# Patient Record
Sex: Female | Born: 1965 | Race: Black or African American | Hispanic: No | Marital: Single | Smoking: Former smoker
Health system: Southern US, Community
[De-identification: ages and names within clinical notes are randomized; demographics above are authoritative.]

## PROBLEM LIST (undated history)

## (undated) DIAGNOSIS — Z923 Personal history of irradiation: Secondary | ICD-10-CM

## (undated) DIAGNOSIS — R7303 Prediabetes: Secondary | ICD-10-CM

## (undated) DIAGNOSIS — I10 Essential (primary) hypertension: Secondary | ICD-10-CM

## (undated) DIAGNOSIS — R519 Headache, unspecified: Secondary | ICD-10-CM

## (undated) DIAGNOSIS — E119 Type 2 diabetes mellitus without complications: Secondary | ICD-10-CM

## (undated) DIAGNOSIS — IMO0002 Reserved for concepts with insufficient information to code with codable children: Secondary | ICD-10-CM

## (undated) HISTORY — PX: NO PAST SURGERIES: SHX2092

## (undated) HISTORY — DX: Reserved for concepts with insufficient information to code with codable children: IMO0002

## (undated) HISTORY — DX: Prediabetes: R73.03

---

## 2004-12-13 ENCOUNTER — Emergency Department: Payer: Self-pay | Admitting: Emergency Medicine

## 2005-06-15 ENCOUNTER — Emergency Department: Payer: Self-pay | Admitting: Emergency Medicine

## 2009-04-28 ENCOUNTER — Emergency Department (HOSPITAL_COMMUNITY): Admission: EM | Admit: 2009-04-28 | Discharge: 2009-04-28 | Payer: Self-pay | Admitting: Emergency Medicine

## 2010-06-07 ENCOUNTER — Emergency Department: Payer: Self-pay | Admitting: Emergency Medicine

## 2011-01-28 LAB — BASIC METABOLIC PANEL
BUN: 19 mg/dL (ref 6–23)
CO2: 25 mEq/L (ref 19–32)
Calcium: 9.1 mg/dL (ref 8.4–10.5)
Chloride: 107 mEq/L (ref 96–112)
Creatinine, Ser: 0.86 mg/dL (ref 0.4–1.2)
GFR calc Af Amer: >60 mL/min (ref >60)
GFR calc non Af Amer: >60 mL/min (ref >60)
Glucose, Bld: 82 mg/dL (ref 70–99)
Potassium: 3.5 mEq/L (ref 3.5–5.1)
Sodium: 140 mEq/L (ref 135–145)

## 2011-01-28 LAB — DIFFERENTIAL
Basophils Absolute: 0.1 10*3/uL (ref 0.0–0.1)
Basophils Relative: 1 % (ref 0–1)
Eosinophils Absolute: 0.4 10*3/uL (ref 0.0–0.7)
Eosinophils Relative: 6 % — ABNORMAL HIGH (ref 0–5)
Lymphocytes Relative: 31 % (ref 12–46)
Lymphs Abs: 2 10*3/uL (ref 0.7–4.0)
Monocytes Absolute: 0.5 10*3/uL (ref 0.1–1.0)
Monocytes Relative: 7 % (ref 3–12)
Neutro Abs: 3.6 10*3/uL (ref 1.7–7.7)
Neutrophils Relative %: 55 % (ref 43–77)

## 2011-01-28 LAB — CBC
HCT: 35.6 % — ABNORMAL LOW (ref 36.0–46.0)
Hemoglobin: 11.5 g/dL — ABNORMAL LOW (ref 12.0–15.0)
MCHC: 32.4 g/dL (ref 30.0–36.0)
MCV: 74.5 fL — ABNORMAL LOW (ref 78.0–100.0)
Platelets: 169 10*3/uL (ref 150–400)
RBC: 4.77 MIL/uL (ref 3.87–5.11)
RDW: 14.3 % (ref 11.5–15.5)
WBC: 6.6 10*3/uL (ref 4.0–10.5)

## 2011-05-13 ENCOUNTER — Emergency Department: Payer: Self-pay | Admitting: Unknown Physician Specialty

## 2011-06-12 ENCOUNTER — Emergency Department: Payer: Self-pay | Admitting: *Deleted

## 2012-04-06 ENCOUNTER — Emergency Department: Payer: Self-pay | Admitting: *Deleted

## 2012-04-06 LAB — COMPREHENSIVE METABOLIC PANEL
Bilirubin,Total: 0.4 mg/dL (ref 0.2–1.0)
Calcium, Total: 8.3 mg/dL — ABNORMAL LOW (ref 8.5–10.1)
Chloride: 111 mmol/L — ABNORMAL HIGH (ref 98–107)
Creatinine: 0.79 mg/dL (ref 0.60–1.30)
Osmolality: 286 (ref 275–301)
Potassium: 3.4 mmol/L — ABNORMAL LOW (ref 3.5–5.1)
SGOT(AST): 17 U/L (ref 15–37)

## 2012-04-06 LAB — URINALYSIS, COMPLETE
Bilirubin,UR: NEGATIVE
Glucose,UR: NEGATIVE mg/dL (ref 0–75)
Ketone: NEGATIVE
Protein: 100
RBC,UR: 9884 /HPF (ref 0–5)
Specific Gravity: 1.024 (ref 1.003–1.030)
Squamous Epithelial: 5

## 2012-04-06 LAB — CBC
Platelet: 225 10*3/uL (ref 150–440)
RBC: 4.36 10*6/uL (ref 3.80–5.20)
RDW: 15.7 % — ABNORMAL HIGH (ref 11.5–14.5)

## 2012-04-06 LAB — WET PREP, GENITAL

## 2012-06-13 ENCOUNTER — Emergency Department: Payer: Self-pay | Admitting: Emergency Medicine

## 2012-06-13 LAB — CBC
MCH: 20.7 pg — ABNORMAL LOW (ref 26.0–34.0)
MCHC: 30.7 g/dL — ABNORMAL LOW (ref 32.0–36.0)
MCV: 67 fL — ABNORMAL LOW (ref 80–100)
Platelet: 201 10*3/uL (ref 150–440)
RBC: 4.49 10*6/uL (ref 3.80–5.20)
RDW: 20.1 % — ABNORMAL HIGH (ref 11.5–14.5)

## 2012-06-13 LAB — COMPREHENSIVE METABOLIC PANEL
Albumin: 3.6 g/dL (ref 3.4–5.0)
Anion Gap: 7 (ref 7–16)
BUN: 19 mg/dL — ABNORMAL HIGH (ref 7–18)
Bilirubin,Total: 0.4 mg/dL (ref 0.2–1.0)
Chloride: 104 mmol/L (ref 98–107)
Creatinine: 0.96 mg/dL (ref 0.60–1.30)
EGFR (African American): >60
Potassium: 3.1 mmol/L — ABNORMAL LOW (ref 3.5–5.1)
Sodium: 140 mmol/L (ref 136–145)
Total Protein: 7.6 g/dL (ref 6.4–8.2)

## 2012-06-13 LAB — PROTIME-INR: Prothrombin Time: 13.4 secs (ref 11.5–14.7)

## 2012-06-13 LAB — URINALYSIS, COMPLETE

## 2013-11-20 ENCOUNTER — Inpatient Hospital Stay: Payer: Self-pay | Admitting: Family Medicine

## 2013-11-20 LAB — CBC
HCT: 38.2 % (ref 35.0–47.0)
HGB: 11.5 g/dL — AB (ref 12.0–16.0)
MCH: 19.5 pg — AB (ref 26.0–34.0)
MCHC: 30 g/dL — AB (ref 32.0–36.0)
MCV: 65 fL — AB (ref 80–100)
Platelet: 158 10*3/uL (ref 150–440)
RBC: 5.9 10*6/uL — ABNORMAL HIGH (ref 3.80–5.20)
RDW: 24.1 % — AB (ref 11.5–14.5)
WBC: 13.3 10*3/uL — AB (ref 3.6–11.0)

## 2013-11-20 LAB — COMPREHENSIVE METABOLIC PANEL
Albumin: 3.4 g/dL (ref 3.4–5.0)
Alkaline Phosphatase: 85 U/L
Anion Gap: 8 (ref 7–16)
BUN: 18 mg/dL (ref 7–18)
Bilirubin,Total: 1.1 mg/dL — ABNORMAL HIGH (ref 0.2–1.0)
CALCIUM: 8.9 mg/dL (ref 8.5–10.1)
CHLORIDE: 103 mmol/L (ref 98–107)
CO2: 25 mmol/L (ref 21–32)
Creatinine: 0.86 mg/dL (ref 0.60–1.30)
EGFR (African American): >60
GLUCOSE: 95 mg/dL (ref 65–99)
Osmolality: 274 (ref 275–301)
POTASSIUM: 3.3 mmol/L — AB (ref 3.5–5.1)
SGOT(AST): 18 U/L (ref 15–37)
SGPT (ALT): 15 U/L (ref 12–78)
Sodium: 136 mmol/L (ref 136–145)
Total Protein: 8.2 g/dL (ref 6.4–8.2)

## 2013-11-20 LAB — DRUG SCREEN, URINE
Amphetamines, Ur Screen: NEGATIVE (ref ?–1000)
BENZODIAZEPINE, UR SCRN: NEGATIVE (ref ?–200)
Barbiturates, Ur Screen: NEGATIVE (ref ?–200)
Cannabinoid 50 Ng, Ur ~~LOC~~: POSITIVE (ref ?–50)
Cocaine Metabolite,Ur ~~LOC~~: NEGATIVE (ref ?–300)
MDMA (Ecstasy)Ur Screen: NEGATIVE (ref ?–500)
METHADONE, UR SCREEN: NEGATIVE (ref ?–300)
Opiate, Ur Screen: POSITIVE (ref ?–300)
PHENCYCLIDINE (PCP) UR S: NEGATIVE (ref ?–25)
TRICYCLIC, UR SCREEN: NEGATIVE (ref ?–1000)

## 2013-11-20 LAB — MAGNESIUM: MAGNESIUM: 1.9 mg/dL

## 2013-11-21 LAB — CBC WITH DIFFERENTIAL/PLATELET
Bands: 2 %
HCT: 35.4 % (ref 35.0–47.0)
HGB: 10.9 g/dL — ABNORMAL LOW (ref 12.0–16.0)
LYMPHS PCT: 19 %
MCH: 20.1 pg — ABNORMAL LOW (ref 26.0–34.0)
MCHC: 30.9 g/dL — ABNORMAL LOW (ref 32.0–36.0)
MCV: 65 fL — AB (ref 80–100)
Platelet: 156 10*3/uL (ref 150–440)
RBC: 5.43 10*6/uL — AB (ref 3.80–5.20)
RDW: 24.5 % — ABNORMAL HIGH (ref 11.5–14.5)
SEGMENTED NEUTROPHILS: 79 %
WBC: 10.2 10*3/uL (ref 3.6–11.0)

## 2013-11-21 LAB — COMPREHENSIVE METABOLIC PANEL
ALBUMIN: 3.1 g/dL — AB (ref 3.4–5.0)
ALK PHOS: 80 U/L
AST: 14 U/L — AB (ref 15–37)
Anion Gap: 3 — ABNORMAL LOW (ref 7–16)
BILIRUBIN TOTAL: 0.6 mg/dL (ref 0.2–1.0)
BUN: 18 mg/dL (ref 7–18)
CO2: 28 mmol/L (ref 21–32)
CREATININE: 1 mg/dL (ref 0.60–1.30)
Calcium, Total: 9 mg/dL (ref 8.5–10.1)
Chloride: 106 mmol/L (ref 98–107)
EGFR (Non-African Amer.): >60
Glucose: 138 mg/dL — ABNORMAL HIGH (ref 65–99)
OSMOLALITY: 278 (ref 275–301)
Potassium: 3.4 mmol/L — ABNORMAL LOW (ref 3.5–5.1)
SGPT (ALT): 13 U/L (ref 12–78)
Sodium: 137 mmol/L (ref 136–145)
Total Protein: 7.8 g/dL (ref 6.4–8.2)

## 2013-11-22 LAB — IRON AND TIBC
IRON BIND. CAP.(TOTAL): 347 ug/dL (ref 250–450)
IRON SATURATION: 8 %
Iron: 27 ug/dL — ABNORMAL LOW (ref 50–170)
UNBOUND IRON-BIND. CAP.: 320 ug/dL

## 2013-11-22 LAB — CBC WITH DIFFERENTIAL/PLATELET
BASOS PCT: 0.2 %
Basophil #: 0 10*3/uL (ref 0.0–0.1)
Eosinophil #: 0 10*3/uL (ref 0.0–0.7)
Eosinophil %: 0.1 %
HCT: 33.4 % — AB (ref 35.0–47.0)
HGB: 10.2 g/dL — ABNORMAL LOW (ref 12.0–16.0)
LYMPHS ABS: 1.9 10*3/uL (ref 1.0–3.6)
LYMPHS PCT: 12.1 %
MCH: 19.7 pg — AB (ref 26.0–34.0)
MCHC: 30.5 g/dL — ABNORMAL LOW (ref 32.0–36.0)
MCV: 65 fL — ABNORMAL LOW (ref 80–100)
MONO ABS: 0.6 x10 3/mm (ref 0.2–0.9)
Monocyte %: 4 %
NEUTROS ABS: 12.8 10*3/uL — AB (ref 1.4–6.5)
NEUTROS PCT: 83.6 %
PLATELETS: 188 10*3/uL (ref 150–440)
RBC: 5.18 10*6/uL (ref 3.80–5.20)
RDW: 24.5 % — ABNORMAL HIGH (ref 11.5–14.5)
WBC: 15.3 10*3/uL — AB (ref 3.6–11.0)

## 2013-11-22 LAB — BASIC METABOLIC PANEL
ANION GAP: 5 — AB (ref 7–16)
BUN: 23 mg/dL — AB (ref 7–18)
CHLORIDE: 108 mmol/L — AB (ref 98–107)
Calcium, Total: 8.8 mg/dL (ref 8.5–10.1)
Co2: 26 mmol/L (ref 21–32)
Creatinine: 0.98 mg/dL (ref 0.60–1.30)
EGFR (African American): >60
GLUCOSE: 144 mg/dL — AB (ref 65–99)
OSMOLALITY: 284 (ref 275–301)
Potassium: 3.9 mmol/L (ref 3.5–5.1)
SODIUM: 139 mmol/L (ref 136–145)

## 2013-11-22 LAB — FERRITIN: FERRITIN (ARMC): 30 ng/mL (ref 8–388)

## 2013-11-22 LAB — MAGNESIUM: Magnesium: 2.3 mg/dL

## 2013-11-23 LAB — CBC WITH DIFFERENTIAL/PLATELET
BASOS ABS: 0 10*3/uL (ref 0.0–0.1)
BASOS PCT: 0.1 %
EOS PCT: 0 %
Eosinophil #: 0 10*3/uL (ref 0.0–0.7)
HCT: 33.8 % — ABNORMAL LOW (ref 35.0–47.0)
HGB: 10.2 g/dL — AB (ref 12.0–16.0)
Lymphocyte #: 2.1 10*3/uL (ref 1.0–3.6)
Lymphocyte %: 18 %
MCH: 19.7 pg — AB (ref 26.0–34.0)
MCHC: 30.3 g/dL — ABNORMAL LOW (ref 32.0–36.0)
MCV: 65 fL — ABNORMAL LOW (ref 80–100)
MONO ABS: 0.5 x10 3/mm (ref 0.2–0.9)
Monocyte %: 4.2 %
Neutrophil #: 9 10*3/uL — ABNORMAL HIGH (ref 1.4–6.5)
Neutrophil %: 77.7 %
Platelet: 194 10*3/uL (ref 150–440)
RBC: 5.19 10*6/uL (ref 3.80–5.20)
RDW: 24.3 % — ABNORMAL HIGH (ref 11.5–14.5)
WBC: 11.6 10*3/uL — ABNORMAL HIGH (ref 3.6–11.0)

## 2013-11-23 LAB — BASIC METABOLIC PANEL
Anion Gap: 3 — ABNORMAL LOW (ref 7–16)
BUN: 22 mg/dL — AB (ref 7–18)
CHLORIDE: 109 mmol/L — AB (ref 98–107)
Calcium, Total: 8.5 mg/dL (ref 8.5–10.1)
Co2: 26 mmol/L (ref 21–32)
Creatinine: 0.9 mg/dL (ref 0.60–1.30)
EGFR (African American): >60
Glucose: 129 mg/dL — ABNORMAL HIGH (ref 65–99)
OSMOLALITY: 281 (ref 275–301)
Potassium: 3.7 mmol/L (ref 3.5–5.1)
Sodium: 138 mmol/L (ref 136–145)

## 2013-11-25 LAB — BASIC METABOLIC PANEL
ANION GAP: 17 — AB (ref 7–16)
BUN: 27 mg/dL — AB (ref 7–18)
CALCIUM: 8.4 mg/dL — AB (ref 8.5–10.1)
CHLORIDE: 108 mmol/L — AB (ref 98–107)
CREATININE: 1.02 mg/dL (ref 0.60–1.30)
Co2: 15 mmol/L — ABNORMAL LOW (ref 21–32)
EGFR (African American): >60
EGFR (Non-African Amer.): >60
GLUCOSE: 119 mg/dL — AB (ref 65–99)
Osmolality: 286 (ref 275–301)
Potassium: 3.5 mmol/L (ref 3.5–5.1)
Sodium: 140 mmol/L (ref 136–145)

## 2013-11-25 LAB — CULTURE, BLOOD (SINGLE)

## 2014-01-28 LAB — LIPID PANEL
CHOLESTEROL: 169 mg/dL (ref 0–200)
HDL: 57 mg/dL (ref 35–70)
LDL CALC: 88 mg/dL
Triglycerides: 120 mg/dL (ref 40–160)

## 2014-01-28 LAB — HEMOGLOBIN A1C: Hemoglobin A1C: 5.7

## 2014-01-28 LAB — TSH: TSH: 1.52 u[IU]/mL (ref <=5.90)

## 2014-02-24 ENCOUNTER — Ambulatory Visit: Payer: Self-pay

## 2015-01-19 ENCOUNTER — Emergency Department
Admit: 2015-01-19 | Disposition: A | Discharge status: Home / Self Care | Payer: Self-pay | Admitting: Emergency Medicine

## 2015-01-19 LAB — CBC WITH DIFFERENTIAL/PLATELET
BASOS ABS: 0 10*3/uL (ref 0.0–0.1)
BASOS PCT: 0.7 %
EOS ABS: 0 10*3/uL (ref 0.0–0.7)
Eosinophil %: 0.2 %
HCT: 26.3 % — ABNORMAL LOW (ref 35.0–47.0)
HGB: 7.4 g/dL — AB (ref 12.0–16.0)
LYMPHS PCT: 14 %
Lymphocyte #: 0.9 10*3/uL — ABNORMAL LOW (ref 1.0–3.6)
MCH: 15.5 pg — ABNORMAL LOW (ref 26.0–34.0)
MCHC: 28.1 g/dL — AB (ref 32.0–36.0)
MCV: 55 fL — ABNORMAL LOW (ref 80–100)
Monocyte #: 0.3 x10 3/mm (ref 0.2–0.9)
Monocyte %: 4.3 %
NEUTROS PCT: 80.8 %
Neutrophil #: 5.2 10*3/uL (ref 1.4–6.5)
PLATELETS: 156 10*3/uL (ref 150–440)
RBC: 4.76 10*6/uL (ref 3.80–5.20)
RDW: 23.4 % — AB (ref 11.5–14.5)
WBC: 6.4 10*3/uL (ref 3.6–11.0)

## 2015-01-19 LAB — BASIC METABOLIC PANEL
ANION GAP: 8 (ref 7–16)
BUN: 17 mg/dL
CHLORIDE: 107 mmol/L
CREATININE: 0.94 mg/dL
Calcium, Total: 8.5 mg/dL — ABNORMAL LOW
Co2: 23 mmol/L
EGFR (Non-African Amer.): >60
Glucose: 103 mg/dL — ABNORMAL HIGH
Potassium: 3.1 mmol/L — ABNORMAL LOW
Sodium: 138 mmol/L

## 2015-02-12 NOTE — H&P (Signed)
PATIENT NAME:  Cindy Burke, Cindy Burke A MR#:  [id number] DATE OF BIRTH:  Jun 11, 1966  DATE OF ADMISSION:  11/20/2013  PRIMARY CARE PHYSICIAN: None.   HISTORY OF PRESENT ILLNESS: The patient is a 49 year old African American female with history of hypertension who is followed by [place], who presents to the hospital with complaints of face as well as jaw swelling and pain. Per the patient, she was doing well up until approximately a week ago when she started having swelling in her face as well as right jaw. She was having also pain whenever she chewed or even when she touched her jaw. She was not able to eat solid food, however, was able to drink a little bit. Because of the pain, she decided to come to Emergency Room for further evaluation. She admits of having sweating, however, denies any fevers or chills. She actually does not have any thermometer to check for fevers. She stated that she has been having problems with teeth for a long period of time now, however, has not seen a dentist for a long period of time as well.   She was noted to have significant swelling in the Emergency Room. She underwent CT scan of her neck on 11/20/2013 and was noted to have right lower molar tooth destruction as well as medial mandible destruction and infection extending into the soft tissues around the mandible, but no abscess was noted. The hospitalist services were contacted for admission.   PAST MEDICAL HISTORY: Significant for history of hypertension.   MEDICATIONS: Unknown. The patient tells me that she is taking some blood pressure medication. She does not remember what kind. She has been given this medication by a physician from [place].     The patient stated on arrival to the Emergency Room that she is out of her blood pressure medications for the past 1 week.   PAST SURGICAL HISTORY: None.   ALLERGIES: None.   FAMILY HISTORY: The patient's mother died of emphysema and also had hypertension. The  patient's father had diabetes mellitus and hypertension. He is also dead.   SOCIAL HISTORY: The patient used to smoke 1 pack per day for more than 30 years. Now she has cut down to 2 cigarettes a day. No alcohol.   REVIEW OF SYSTEMS: Positive for feeling sweaty. Admits of pains in the jaw, difficulty swallowing, a prolonged period of tooth problems but denies any fevers, chills, fatigue, weakness, weight loss or gain.  EYES: Denies any blurry vision, double vision, glaucoma or cataracts.  ENT: Denies any tinnitus, allergies, epistaxis, sinus pain.  RESPIRATORY: Denies cough, wheezes, asthma, COPD. CARDIOVASCULAR:  Denies any chest pain.  Denies orthopnea, edema, arrhythmias, palpitations or syncope.  17.  GASTROINTESTINAL: Denies nausea, diarrhea, or constipation.  18.  GENITOURINARY: Denies dysuria, hematuria, frequency, or incontinence.  19.  ENDOCRINE: Denies polydipsia, nocturia, thyroid problems, heat or cold intolerance or thirst.  20.  HEMATOLOGIC: Denies anemia, easy bruising, bleeding or swollen glands.  SKIN: Denies any acne, rash, lesions or change in moles.   MUSCULOSKELETAL: Denies arthritis, cramps, swelling, or gout.  NEUROLOGIC: Denies any numbness, epilepsy, tremors.  PSYCHIATRIC: Denies anxiety, insomnia, depression.   PHYSICAL EXAMINATION:  VITAL SIGNS: On arrival to the hospital, temperature is 98.9, pulse 86, respiratory rate was 20, blood pressure 208/111, saturation 100% on room air.  GENERAL: A well-developed, well-nourished, obese, African American female in mild distress, lying on the stretcher.  HEENT: Her pupils were equal and reactive to light. Extraocular movements intact. No icterus  or conjunctivitis. No pharyngeal edema. Mucosa is moist.  The patient does have multiple decayed teeth in the right lower mandible. Having difficulty reaching those teeth, however, very tender to palpation. The jaw is very swollen and lower facial swelling was noted with some facial  paresis even due to swelling.  NECK: Very swollen. Not able to assess for adenopathy because of significant swelling and tenderness. No JVD. No carotid bruits bilaterally. Full range of motion.  LUNGS: Clear to auscultation in all fields. No rales, rhonchi, diminished breath sounds, or wheezing. No labored inspirations, increased effort, dullness to percussion or respiratory distress.  CARDIOVASCULAR: S1, S2 appreciated. No murmurs, rubs, or gallops. PMI not lateralized. Chest is not tender to palpation. Rhythm was regular. No lower extremity edema, calf tenderness or cyanosis was noted.  ABDOMEN: Soft, nontender. Bowel sounds are present. No hepatosplenomegaly or masses were noted.  RECTAL: Deferred.  MUSCULOSKELETAL: Able to move all extremities. No cyanosis, degenerative joint or kyphosis. Gait not tested.  SKIN: Did not reveal any rashes, lesions, erythema, nodularity or induration. It was warm and dry to palpation.  LYMPHATIC: No adenopathy in the cervical region.  NEUROLOGICAL: Cranial nerves grossly intact. Sensory is intact. No dysarthria or aphasia.  PSYCHIATRIC: Alert and oriented to time, person and place. Cooperative. Memory is good. No significant confusion, agitation or depression noted.   LABORATORIES: Potassium of 3.3; otherwise, BMP was unremarkable. The patient's bilirubin was elevated at 1.1;  otherwise, liver enzymes were normal. White blood cell count is up to 13.3, hemoglobin 11.5, and platelet count was 158 and MCV low at 65.   RADIOLOGIC STUDIES: CT of neck with contrast, 11/20/2013, revealed abscessed right lower molar tooth with destruction of medial mandible and infection extending into the soft tissues around the mandible, especially medially. No soft-tissue abscess was identified. Mild adenopathy in the right neck related to acute infection. No compromise of the airway was noted.   EKG showed normal sinus rhythm at 75 beats per minute with normal axis, possible left  atrial enlargement, incomplete right bundle branch block, ST depression in the lateral leads. The patient is no different from July 2012 EKG.   ASSESSMENT AND PLAN:  1.  Decayed right lower mandible tooth with abscess. Admit the patient to the medical floor, starting her on Unasyn as well as Decadron. Get ENT involved.  2.  Hypertension. Start the patient on lisinopril. Give her 1 dose of Vasotec. Advance blood pressure medications if needed for better blood pressure control.  3.  Hypokalemia. Supplement it IV. Get magnesium level.  4.  Elevated white blood cell count. Follow with therapy.  5.  Anemia. Get guaiac. Will also get iron studies as the patient's MCV is low at 65.   TIME SPENT: 50 minutes on this patient.   ____________________________ Theodoro Grist, MD rv:[id number] D: 11/20/2013 17:25:53 ET T: 11/20/2013 18:14:56 ET JOB#: [id number]  cc: Theodoro Grist, MD, <Dictator> Elbia Paro MD ELECTRONICALLY SIGNED 12/22/2013 21:01

## 2015-02-12 NOTE — Discharge Summary (Signed)
PATIENT NAME:  Cindy Burke, Cindy Burke MR#:  [id number] DATE OF BIRTH:  28-Dec-1965  DATE OF ADMISSION:  11/20/2013 DATE OF DISCHARGE:  11/25/2013  REASON FOR ADMISSION: Dental abscess on the right cheek.   DISPOSITION: Home.   DISCHARGE DIAGNOSES: 1. Edema of the right face and jaw. 2. Right lower molar tooth abscess.  3. Extension and destruction of the mandible.  4. Intractable nausea and vomiting, intractable pain.  5. Dysphagia.  6. Hypokalemia.  7. Malignant hypertension.  8. Iron deficiency anemia.  9. Leukocytosis.  10. Tobacco abuse.   MEDICATIONS AT DISCHARGE:  1. Prednisone 20 mg once Burke day for the next 3 days. 2. Ibuprofen 600 mg every 8 hours as needed for pain. 3. Lisinopril 20 mg take 2 tablets once Burke day Burke total of 40 mg.  4. Amlodipine 10 mg once daily.  5. Benzocaine with menthol topical lozenges for sore throat.  6. Penicillin V potassium 500 mg 3 times Burke day for the next 10 days. 7. Norco 5/325 mg 1 to 2 every 6 hours as needed for pain.  8. Promethazine 12.5 mg every 6 hours as needed for nausea and vomiting.   FOLLOWUP: 1. Referral to new primary care physician in the next 2 weeks for management of hypertension.  2. Referral to [facility] for evaluation of removal of decayed tooth and also for biopsy of mandible as there is some destruction of it.   HOSPITAL COURSE: This is Burke very nice 49 year old female with history of high blood pressure, taking medications for blood pressure given to her within the last week. The patient presented to the emergency department complaining of her face being swollen at the level of the jaw and cheek on the right side starting about Burke week ago. Had significant pain with chewing that was radiating down into the jaw. Her pain was intolerable, for which she decided to come to the Emergency Department as she was not able to eat or drink anything due to the pain. The patient was found to have blood pressure of  208/111 and temperature of 98.8. The patient was admitted due to decayed right tooth and abscess with extension to the mandible and malignant hypertension.   As per problems:  1. Right lower mandible tooth abscess with extension and destruction of the mandible. The patient was admitted and treated with antibiotics, Unasyn, and steroids, Decadron. As she was unable to eat or drink anything, the patient needed to be kept for IV fluid hydration, and her nausea and vomiting needed to be controlled very closely. The patient had significant swelling, and the swelling did not come down until about 2 to 3 days in the admission process. By the day of discharge, her swelling has decreased significantly, and she is more comfortable eating. Two days prior to discharge, the patient had significant nausea and vomiting, and she was not tolerating fluids, for which we decided to keep her Burke little bit longer.  2. The patient has been given different treatments for her blood pressure, including lisinopril and amlodipine, and she has orders to follow up with Burke new primary care physician within the next 1 to 2 weeks. I spent Burke great amount of time counseling about high blood pressure as her blood pressure has been significantly elevated. Malignant hypertension, on lisinopril and amlodipine now. The patient does not have Burke primary care physician. She was given information about PCPs in town that could be taking her and gave her  referral for the open clinic.  3. She had significant leukocytosis, which was likely worsened by the steroids, and anemia, with negative guaiac and iron studies showing iron deficiency. The patient was given prescription for iron.   TIME SPENT: I spent about 45 minutes with this discharge on the day of discharge.  ____________________________ [place] Sink, MD rsg:lb D: 11/28/2013 07:08:41 ET T: 11/28/2013 07:45:10 ET JOB#: [id number]  cc: Glencoe Sink, MD, <Dictator> Isla Sabree  America Brown MD ELECTRONICALLY SIGNED 12/06/2013 23:17

## 2015-11-13 ENCOUNTER — Emergency Department
Admission: EM | Admit: 2015-11-13 | Discharge: 2015-11-13 | Disposition: A | Discharge status: Home / Self Care | Payer: Self-pay | Attending: Emergency Medicine | Admitting: Emergency Medicine

## 2015-11-13 ENCOUNTER — Emergency Department: Payer: Self-pay

## 2015-11-13 ENCOUNTER — Encounter: Payer: Self-pay | Admitting: Emergency Medicine

## 2015-11-13 DIAGNOSIS — N939 Abnormal uterine and vaginal bleeding, unspecified: Secondary | ICD-10-CM | POA: Insufficient documentation

## 2015-11-13 DIAGNOSIS — F17200 Nicotine dependence, unspecified, uncomplicated: Secondary | ICD-10-CM | POA: Insufficient documentation

## 2015-11-13 DIAGNOSIS — R319 Hematuria, unspecified: Secondary | ICD-10-CM

## 2015-11-13 DIAGNOSIS — Z3202 Encounter for pregnancy test, result negative: Secondary | ICD-10-CM | POA: Insufficient documentation

## 2015-11-13 DIAGNOSIS — I10 Essential (primary) hypertension: Secondary | ICD-10-CM | POA: Insufficient documentation

## 2015-11-13 DIAGNOSIS — N390 Urinary tract infection, site not specified: Secondary | ICD-10-CM | POA: Insufficient documentation

## 2015-11-13 DIAGNOSIS — N923 Ovulation bleeding: Secondary | ICD-10-CM

## 2015-11-13 HISTORY — DX: Essential (primary) hypertension: I10

## 2015-11-13 LAB — CBC WITH DIFFERENTIAL/PLATELET
BASOS PCT: 1 %
Basophils Absolute: 0.1 10*3/uL (ref 0–0.1)
EOS ABS: 0.8 10*3/uL — AB (ref 0–0.7)
EOS PCT: 10 %
HCT: 26.5 % — ABNORMAL LOW (ref 35.0–47.0)
HEMOGLOBIN: 8.2 g/dL — AB (ref 12.0–16.0)
LYMPHS ABS: 2.2 10*3/uL (ref 1.0–3.6)
Lymphocytes Relative: 29 %
MCH: 19.9 pg — AB (ref 26.0–34.0)
MCHC: 30.9 g/dL — AB (ref 32.0–36.0)
MCV: 64.5 fL — ABNORMAL LOW (ref 80.0–100.0)
Monocytes Absolute: 0.5 10*3/uL (ref 0.2–0.9)
Monocytes Relative: 6 %
NEUTROS PCT: 54 %
Neutro Abs: 4.1 10*3/uL (ref 1.4–6.5)
PLATELETS: 250 10*3/uL (ref 150–440)
RBC: 4.1 MIL/uL (ref 3.80–5.20)
RDW: 16.7 % — ABNORMAL HIGH (ref 11.5–14.5)
WBC: 7.6 10*3/uL (ref 3.6–11.0)

## 2015-11-13 LAB — URINALYSIS COMPLETE WITH MICROSCOPIC (ARMC ONLY)
Bilirubin Urine: NEGATIVE
GLUCOSE, UA: NEGATIVE mg/dL
KETONES UR: NEGATIVE mg/dL
NITRITE: NEGATIVE
PROTEIN: 30 mg/dL — AB
Specific Gravity, Urine: 1.021 (ref 1.005–1.030)
pH: 6 (ref 5.0–8.0)

## 2015-11-13 LAB — CHLAMYDIA/NGC RT PCR (ARMC ONLY)
CHLAMYDIA TR: NOT DETECTED
N gonorrhoeae: NOT DETECTED

## 2015-11-13 LAB — COMPREHENSIVE METABOLIC PANEL
ALBUMIN: 3.7 g/dL (ref 3.5–5.0)
ALK PHOS: 59 U/L (ref 38–126)
ALT: 16 U/L (ref 14–54)
AST: 14 U/L — ABNORMAL LOW (ref 15–41)
Anion gap: 5 (ref 5–15)
BILIRUBIN TOTAL: 0.4 mg/dL (ref 0.3–1.2)
BUN: 21 mg/dL — ABNORMAL HIGH (ref 6–20)
CALCIUM: 8.3 mg/dL — AB (ref 8.9–10.3)
CO2: 24 mmol/L (ref 22–32)
CREATININE: 0.92 mg/dL (ref 0.44–1.00)
Chloride: 108 mmol/L (ref 101–111)
Glucose, Bld: 93 mg/dL (ref 65–99)
Potassium: 3.3 mmol/L — ABNORMAL LOW (ref 3.5–5.1)
Sodium: 137 mmol/L (ref 135–145)
TOTAL PROTEIN: 7 g/dL (ref 6.5–8.1)

## 2015-11-13 LAB — WET PREP, GENITAL
CLUE CELLS WET PREP: NONE SEEN
Sperm: NONE SEEN
Trich, Wet Prep: NONE SEEN
Yeast Wet Prep HPF POC: NONE SEEN

## 2015-11-13 LAB — PREGNANCY, URINE: PREG TEST UR: NEGATIVE

## 2015-11-13 MED ORDER — FERROUS GLUCONATE 324 (38 FE) MG PO TABS: 324.0000 mg | ORAL_TABLET | Freq: Every day | ORAL | Status: DC

## 2015-11-13 MED ORDER — LISINOPRIL 5 MG PO TABS
5.0000 mg | ORAL_TABLET | Freq: Once | ORAL | Status: AC
  Administered 2015-11-13: 5 mg via ORAL
  Filled 2015-11-13: qty 1

## 2015-11-13 MED ORDER — DOXYCYCLINE HYCLATE 50 MG PO CAPS: 100.0000 mg | ORAL_CAPSULE | Freq: Two times a day (BID) | ORAL | Status: DC

## 2015-11-13 MED ORDER — LISINOPRIL 5 MG PO TABS: 5.0000 mg | ORAL_TABLET | Freq: Every day | ORAL | Status: DC

## 2015-11-13 MED ORDER — CEPHALEXIN 500 MG PO CAPS
500.0000 mg | ORAL_CAPSULE | Freq: Once | ORAL | Status: AC
  Administered 2015-11-13: 500 mg via ORAL
  Filled 2015-11-13: qty 1

## 2015-11-13 MED ORDER — DOXYCYCLINE HYCLATE 100 MG PO TABS
100.0000 mg | ORAL_TABLET | Freq: Once | ORAL | Status: AC
  Administered 2015-11-13: 100 mg via ORAL
  Filled 2015-11-13: qty 1

## 2015-11-13 MED ORDER — CEPHALEXIN 500 MG PO CAPS: 500.0000 mg | ORAL_CAPSULE | Freq: Four times a day (QID) | ORAL | Status: AC

## 2015-11-13 NOTE — ED Notes (Signed)
States has been bleeding since October. Has not seen a doctor.  States making her feel weak

## 2015-11-13 NOTE — ED Notes (Signed)
Patient states that she has been having vaginal bleeding since September. Patient has also been having weakness and dizziness. Patient reports using 64 pads within 24 hours, this has been going on since September as well. Patient reports passing clots and pain in the RLQ.

## 2015-11-13 NOTE — ED Provider Notes (Signed)
[facility] Emergency Department Provider Note  ____________________________________________  Time seen: Approximately 8:07 PM  I have reviewed the triage vital signs and the nursing notes.   HISTORY  Chief Complaint Vaginal Bleeding    HPI Cindy Burke is a 50 y.o. female patient reports she still has her menstrual periods but now she is not stopping bleeding. She's been bleeding pretty much constantly for the last 5 months. Have your times later other times. She has a little bit of lower abdominal pain at times. She is somewhat unsteady at times. He gets lightheaded. She was seen once at [facility] internal [facility] and given lisinopril for her blood pressure 5 mg but has not been asked to come back and ran out of the blood pressure medicine.   Past Medical History  Diagnosis Date  . Hypertension     There are no active problems to display for this patient.   History reviewed. No pertinent past surgical history.  Current Outpatient Rx  Name  Route  Sig  Dispense  Refill  . cephALEXin (KEFLEX) 500 MG capsule   Oral   Take 1 capsule (500 mg total) by mouth 4 (four) times daily.   40 capsule   0   . doxycycline (VIBRAMYCIN) 50 MG capsule   Oral   Take 2 capsules (100 mg total) by mouth 2 (two) times daily.   56 capsule   0   . ferrous gluconate (FERGON) 324 MG tablet   Oral   Take 1 tablet (324 mg total) by mouth daily with breakfast.   60 tablet   3   . lisinopril (PRINIVIL,ZESTRIL) 5 MG tablet   Oral   Take 1 tablet (5 mg total) by mouth daily.   1 tablet   1     Allergies Review of patient's allergies indicates no known allergies.  History reviewed. No pertinent family history.  Social History Social History  Substance Use Topics  . Smoking status: Current Some Day Smoker  . Smokeless tobacco: None  . Alcohol Use: No    Review of Systems Constitutional: No fever/chills Eyes: No visual changes. ENT: No sore  throat. Cardiovascular: Denies chest pain. Respiratory: Denies shortness of breath. Gastrointestinal: No abdominal pain center very mild in the lower abdomen.  No nausea, no vomiting.  No diarrhea.  No constipation. Genitourinary: Negative for dysuria. Musculoskeletal: Negative for back pain. Skin: Negative for rash. Neurological: Negative for headaches, focal weakness or numbness. { 10-point ROS otherwise negative.  ____________________________________________   PHYSICAL EXAM:  VITAL SIGNS: ED Triage Vitals  Enc Vitals Group     BP 11/13/15 1650 214/97 mmHg     Pulse Rate 11/13/15 1650 78     Resp 11/13/15 1650 18     Temp 11/13/15 1650 98.1 F (36.7 C)     Temp Source 11/13/15 1650 Oral     SpO2 11/13/15 1650 100 %     Weight 11/13/15 1650 218 lb (98.884 kg)     Height 11/13/15 1650 5\' 6"  (1.676 m)     Head Cir --      Peak Flow --      Pain Score 11/13/15 1703 7     Pain Loc --      Pain Edu? --      Excl. in [place]? --     Constitutional: Alert and oriented. Well appearing and in no acute distress. Eyes: Conjunctivae are normal. PERRL. EOMI. Head: Atraumatic. Nose: No congestion/rhinnorhea. Mouth/Throat: Mucous membranes are  moist.  Oropharynx non-erythematous. Neck: No stridor.   Cardiovascular: Normal rate, regular rhythm. Grossly normal heart sounds.  Good peripheral circulation. Respiratory: Normal respiratory effort.  No retractions. Lungs CTAB. Gastrointestinal: Soft and nontender. No distention. No abdominal bruits. No CVA tenderness. Genitourinary:  Musculoskeletal: No lower extremity tenderness nor edema.  No joint effusions. Neurologic:  Normal speech and language. No gross focal neurologic deficits are appreciated. No gait instability. Skin:  Skin is warm, dry and intact. No rash noted. Psychiatric: Mood and affect are normal. Speech and behavior are normal.  ____________________________________________   LABS (all labs ordered are listed, but only  abnormal results are displayed)  Labs Reviewed  WET PREP, GENITAL - Abnormal; Notable for the following:    WBC, Wet Prep HPF POC MODERATE (*)    All other components within normal limits  CBC WITH DIFFERENTIAL/PLATELET - Abnormal; Notable for the following:    Hemoglobin 8.2 (*)    HCT 26.5 (*)    MCV 64.5 (*)    MCH 19.9 (*)    MCHC 30.9 (*)    RDW 16.7 (*)    Eosinophils Absolute 0.8 (*)    All other components within normal limits  COMPREHENSIVE METABOLIC PANEL - Abnormal; Notable for the following:    Potassium 3.3 (*)    BUN 21 (*)    Calcium 8.3 (*)    AST 14 (*)    All other components within normal limits  URINALYSIS COMPLETEWITH MICROSCOPIC ([facility] ONLY) - Abnormal; Notable for the following:    Color, Urine YELLOW (*)    APPearance CLOUDY (*)    Hgb urine dipstick 3+ (*)    Protein, ur 30 (*)    Leukocytes, UA TRACE (*)    Bacteria, UA FEW (*)    Squamous Epithelial / LPF 0-5 (*)    All other components within normal limits  CHLAMYDIA/NGC RT PCR ([facility] ONLY)  PREGNANCY, URINE  POC URINE PREG, ED   ____________________________________________  EKG   ____________________________________________  RADIOLOGY Ultrasound read by radiology as having normal uterine lining with. There is also a left sided ovarian cyst or hydrosalpinx. Slightly smaller than it was before. ____________________________________________   PROCEDURES  Patient and studies labs discussed with Dr. Leticia Penna who will follow patient up in the office. She is also aware of the blood pressure I will give the patient lisinopril 5 once a day ferrous gluconate at Dr. Leticia Penna suggestion also will treat the patient's UTI with Keflex and doxycycline  ____________________________________________   INITIAL IMPRESSION / ASSESSMENT AND PLAN / ED COURSE  Pertinent labs & imaging results that were available during my care of the patient were reviewed by me and considered in my medical decision making  (see chart for details).   ____________________________________________   FINAL CLINICAL IMPRESSION(S) / ED DIAGNOSES  Final diagnoses:  Urinary tract infection with hematuria, site unspecified  Vaginal bleeding between periods  Essential hypertension      Nena Polio, MD 11/13/15 2354

## 2015-11-14 ENCOUNTER — Encounter: Payer: Self-pay | Admitting: Emergency Medicine

## 2015-11-14 ENCOUNTER — Emergency Department
Admission: EM | Admit: 2015-11-14 | Discharge: 2015-11-14 | Disposition: A | Discharge status: Home / Self Care | Payer: Self-pay | Attending: Emergency Medicine | Admitting: Emergency Medicine

## 2015-11-14 DIAGNOSIS — F17200 Nicotine dependence, unspecified, uncomplicated: Secondary | ICD-10-CM | POA: Insufficient documentation

## 2015-11-14 DIAGNOSIS — Z79899 Other long term (current) drug therapy: Secondary | ICD-10-CM | POA: Insufficient documentation

## 2015-11-14 DIAGNOSIS — Z792 Long term (current) use of antibiotics: Secondary | ICD-10-CM | POA: Insufficient documentation

## 2015-11-14 DIAGNOSIS — I10 Essential (primary) hypertension: Secondary | ICD-10-CM | POA: Insufficient documentation

## 2015-11-14 NOTE — Discharge Instructions (Signed)
Hypertension Hypertension, commonly called high blood pressure, is when the force of blood pumping through your arteries is too strong. Your arteries are the blood vessels that carry blood from your heart throughout your body. A blood pressure reading consists of a higher number over a lower number, such as 110/72. The higher number (systolic) is the pressure inside your arteries when your heart pumps. The lower number (diastolic) is the pressure inside your arteries when your heart relaxes. Ideally you want your blood pressure below 120/80. Hypertension forces your heart to work harder to pump blood. Your arteries may become narrow or stiff. Having untreated or uncontrolled hypertension can cause heart attack, stroke, kidney disease, and other problems. RISK FACTORS Some risk factors for high blood pressure are controllable. Others are not.  Risk factors you cannot control include:   Race. You may be at higher risk if you are African American.  Age. Risk increases with age.  Gender. Men are at higher risk than women before age 45 years. After age 65, women are at higher risk than men. Risk factors you can control include:  Not getting enough exercise or physical activity.  Being overweight.  Getting too much fat, sugar, calories, or salt in your diet.  Drinking too much alcohol. SIGNS AND SYMPTOMS Hypertension does not usually cause signs or symptoms. Extremely high blood pressure (hypertensive crisis) may cause headache, anxiety, shortness of breath, and nosebleed. DIAGNOSIS To check if you have hypertension, your health care provider will measure your blood pressure while you are seated, with your arm held at the level of your heart. It should be measured at least twice using the same arm. Certain conditions can cause a difference in blood pressure between your right and left arms. A blood pressure reading that is higher than normal on one occasion does not mean that you need treatment. If  it is not clear whether you have high blood pressure, you may be asked to return on a different day to have your blood pressure checked again. Or, you may be asked to monitor your blood pressure at home for 1 or more weeks. TREATMENT Treating high blood pressure includes making lifestyle changes and possibly taking medicine. Living a healthy lifestyle can help lower high blood pressure. You may need to change some of your habits. Lifestyle changes may include:  Following the DASH diet. This diet is high in fruits, vegetables, and whole grains. It is low in salt, red meat, and added sugars.  Keep your sodium intake below 2,300 mg per day.  Getting at least 30-45 minutes of aerobic exercise at least 4 times per week.  Losing weight if necessary.  Not smoking.  Limiting alcoholic beverages.  Learning ways to reduce stress. Your health care provider may prescribe medicine if lifestyle changes are not enough to get your blood pressure under control, and if one of the following is true:  You are 18-59 years of age and your systolic blood pressure is above 140.  You are 60 years of age or older, and your systolic blood pressure is above 150.  Your diastolic blood pressure is above 90.  You have diabetes, and your systolic blood pressure is over 140 or your diastolic blood pressure is over 90.  You have kidney disease and your blood pressure is above 140/90.  You have heart disease and your blood pressure is above 140/90. Your personal target blood pressure may vary depending on your medical conditions, your age, and other factors. HOME CARE INSTRUCTIONS    Have your blood pressure rechecked as directed by your health care provider.   Take medicines only as directed by your health care provider. Follow the directions carefully. Blood pressure medicines must be taken as prescribed. The medicine does not work as well when you skip doses. Skipping doses also puts you at risk for  problems.  Do not smoke.   Monitor your blood pressure at home as directed by your health care provider. SEEK MEDICAL CARE IF:   You think you are having a reaction to medicines taken.  You have recurrent headaches or feel dizzy.  You have swelling in your ankles.  You have trouble with your vision. SEEK IMMEDIATE MEDICAL CARE IF:  You develop a severe headache or confusion.  You have unusual weakness, numbness, or feel faint.  You have severe chest or abdominal pain.  You vomit repeatedly.  You have trouble breathing. MAKE SURE YOU:   Understand these instructions.  Will watch your condition.  Will get help right away if you are not doing well or get worse.   This information is not intended to replace advice given to you by your health care provider. Make sure you discuss any questions you have with your health care provider.   Document Released: 10/08/2005 Document Revised: 02/22/2015 Document Reviewed: 07/31/2013 Elsevier Interactive Patient Education 2016 [employer]  

## 2015-11-14 NOTE — ED Notes (Signed)
Pt seen here last night by Dr Cinda Quest for HTN, was told to return today to get it check. Pt denies any CP or shortness of breath. Pt has not taken prescribed medications today.

## 2015-11-14 NOTE — ED Provider Notes (Signed)
[facility] Emergency Department Provider Note  ____________________________________________  Time seen: On arrival  I have reviewed the triage vital signs and the nursing notes.   HISTORY  Chief Complaint Hypertension    HPI Cindy Burke is a 50 y.o. female who presents today for recheck of her blood pressure. She was seen yesterday and started on lisinopril although she has not had this prescription filled yet and was under the impression that she needs to come back to the ED to have her blood pressure rechecked. She has not taken the medicationas it has not been filled. She has no physical complaints. She has a long history of untreated hypertension.    Past Medical History  Diagnosis Date  . Hypertension     There are no active problems to display for this patient.   History reviewed. No pertinent past surgical history.  Current Outpatient Rx  Name  Route  Sig  Dispense  Refill  . cephALEXin (KEFLEX) 500 MG capsule   Oral   Take 1 capsule (500 mg total) by mouth 4 (four) times daily.   40 capsule   0   . doxycycline (VIBRAMYCIN) 50 MG capsule   Oral   Take 2 capsules (100 mg total) by mouth 2 (two) times daily.   56 capsule   0   . ferrous gluconate (FERGON) 324 MG tablet   Oral   Take 1 tablet (324 mg total) by mouth daily with breakfast.   60 tablet   3   . lisinopril (PRINIVIL,ZESTRIL) 5 MG tablet   Oral   Take 1 tablet (5 mg total) by mouth daily.   1 tablet   1     Allergies Review of patient's allergies indicates no known allergies.  No family history on file.  Social History Social History  Substance Use Topics  . Smoking status: Current Some Day Smoker  . Smokeless tobacco: None  . Alcohol Use: No    Review of Systems  Constitutional: Negative for dizziness  Cardiac: Negative for chest pain   Musculoskeletal: Negative for back pain.  Neurological: Negative for headaches or focal  weakness   ____________________________________________   PHYSICAL EXAM:  VITAL SIGNS: ED Triage Vitals  Enc Vitals Group     BP 11/14/15 1556 202/97 mmHg     Pulse Rate 11/14/15 1556 80     Resp 11/14/15 1556 16     Temp 11/14/15 1556 98.1 F (36.7 C)     Temp Source 11/14/15 1556 Oral     SpO2 11/14/15 1556 99 %     Weight 11/14/15 1556 218 lb (98.884 kg)     Height 11/14/15 1556 5\' 6"  (1.676 m)     Head Cir --      Peak Flow --      Pain Score 11/14/15 1556 0     Pain Loc --      Pain Edu? --      Excl. in [place]? --      Constitutional: Alert and oriented. Well appearing and in no distress. Eyes: Conjunctivae are normal.  ENT   Head: Normocephalic and atraumatic.   Mouth/Throat: Mucous membranes are moist. Cardiovascular: Normal rate, regular rhythm.  Respiratory: Normal respiratory effort without tachypnea nor retractions.  Gastrointestinal: Soft and non-tender in all quadrants. No distention. There is no CVA tenderness. Musculoskeletal: Nontender with normal range of motion in all extremities. Neurologic:  Normal speech and language. No gross focal neurologic deficits are appreciated. Skin:  Skin  is warm, dry and intact. No rash noted. Psychiatric: Mood and affect are normal. Patient exhibits appropriate insight and judgment.  ____________________________________________    LABS (pertinent positives/negatives)  Labs Reviewed - No data to display  ____________________________________________     ____________________________________________    RADIOLOGY I have personally reviewed any xrays that were ordered on this patient: None  ____________________________________________   PROCEDURES  Procedure(s) performed: none   ____________________________________________   INITIAL IMPRESSION / ASSESSMENT AND PLAN / ED COURSE  Pertinent labs & imaging results that were available during my care of the patient were reviewed by me and considered in  my medical decision making (see chart for details).  Patient well-appearing and in no distress. Reviewed labs from yesterday. Her blood pressure tends to be markedly elevated and this is because she has not taken any medications. We discussed the need to fill the prescription and start lisinopril as well as establish consistent PCP follow-up for adjustment of her medications that she will likely need increased doses and/or second medication. She agrees with this plan and will follow up with PCP  ____________________________________________   FINAL CLINICAL IMPRESSION(S) / ED DIAGNOSES  Final diagnoses:  Essential hypertension     Lavonia Drafts, MD 11/14/15 2152

## 2015-11-14 NOTE — ED Notes (Signed)
Pt seen in er last night.  Pt here today for blood pressure check and blood pressure still elevated   No headache.  No slurred speech.  No n/v.  Pt has not taken any blood pressure meds since midnight.  Pt alert and speech is clear.

## 2015-11-23 ENCOUNTER — Ambulatory Visit: Payer: Self-pay | Admitting: Internal Medicine

## 2015-12-07 ENCOUNTER — Ambulatory Visit: Payer: Self-pay | Admitting: Internal Medicine

## 2015-12-13 ENCOUNTER — Ambulatory Visit: Payer: Self-pay

## 2015-12-15 ENCOUNTER — Ambulatory Visit: Payer: Self-pay

## 2015-12-15 DIAGNOSIS — I10 Essential (primary) hypertension: Secondary | ICD-10-CM | POA: Insufficient documentation

## 2015-12-15 DIAGNOSIS — N920 Excessive and frequent menstruation with regular cycle: Secondary | ICD-10-CM | POA: Insufficient documentation

## 2015-12-16 DIAGNOSIS — N920 Excessive and frequent menstruation with regular cycle: Secondary | ICD-10-CM

## 2015-12-16 DIAGNOSIS — I10 Essential (primary) hypertension: Secondary | ICD-10-CM

## 2015-12-22 ENCOUNTER — Other Ambulatory Visit: Payer: Self-pay

## 2015-12-22 DIAGNOSIS — I10 Essential (primary) hypertension: Secondary | ICD-10-CM

## 2015-12-23 LAB — BASIC METABOLIC PANEL
BUN/Creatinine Ratio: 23 (ref 9–23)
BUN: 18 mg/dL (ref 6–24)
CALCIUM: 9 mg/dL (ref 8.7–10.2)
CO2: 20 mmol/L (ref 18–29)
CREATININE: 0.8 mg/dL (ref 0.57–1.00)
Chloride: 101 mmol/L (ref 96–106)
GFR, EST AFRICAN AMERICAN: 99 mL/min/{1.73_m2} (ref >59)
GFR, EST NON AFRICAN AMERICAN: 86 mL/min/{1.73_m2} (ref >59)
Glucose: 90 mg/dL (ref 65–99)
POTASSIUM: 4.1 mmol/L (ref 3.5–5.2)
Sodium: 138 mmol/L (ref 134–144)

## 2015-12-23 LAB — CBC WITH DIFFERENTIAL
Basophils Absolute: 0.1 10*3/uL (ref 0.0–0.2)
Basos: 1 %
EOS (ABSOLUTE): 0.8 10*3/uL — ABNORMAL HIGH (ref 0.0–0.4)
Eos: 10 %
HEMATOCRIT: 32 % — AB (ref 34.0–46.6)
HEMOGLOBIN: 9.5 g/dL — AB (ref 11.1–15.9)
IMMATURE GRANULOCYTES: 0 %
Immature Grans (Abs): 0 10*3/uL (ref 0.0–0.1)
LYMPHS: 28 %
Lymphocytes Absolute: 2.3 10*3/uL (ref 0.7–3.1)
MCH: 19 pg — ABNORMAL LOW (ref 26.6–33.0)
MCHC: 29.7 g/dL — ABNORMAL LOW (ref 31.5–35.7)
MCV: 64 fL — AB (ref 79–97)
Monocytes Absolute: 0.8 10*3/uL (ref 0.1–0.9)
Monocytes: 9 %
NEUTROS ABS: 4.4 10*3/uL (ref 1.4–7.0)
Neutrophils: 52 %
RBC: 5 x10E6/uL (ref 3.77–5.28)
RDW: 20.8 % — AB (ref 12.3–15.4)
WBC: 8.3 10*3/uL (ref 3.4–10.8)

## 2016-01-05 ENCOUNTER — Ambulatory Visit: Payer: Self-pay

## 2016-01-24 ENCOUNTER — Encounter: Payer: Self-pay | Admitting: Obstetrics & Gynecology

## 2016-01-24 ENCOUNTER — Other Ambulatory Visit: Payer: Self-pay | Admitting: Obstetrics & Gynecology

## 2016-01-24 ENCOUNTER — Ambulatory Visit: Payer: Self-pay | Admitting: Obstetrics & Gynecology

## 2016-01-24 VITALS — BP 181/88 | HR 79 | Wt 250.0 lb

## 2016-01-24 DIAGNOSIS — N921 Excessive and frequent menstruation with irregular cycle: Secondary | ICD-10-CM

## 2016-01-24 NOTE — Progress Notes (Signed)
   Subjective:    Patient ID: Cindy Burke, female    DOB: 01/08/1966, 50 y.o.   MRN: [id number]  HPI The pt is a 50 y/o G16P2002 female with a PMHx of HTN who presents to the clinic today c/o gradual onset of persisent irregular menstrual cycles since August 2016. Her periods have become "heavier" and more frequent compared to what they have been in the past, and she has been using "a lot" of pads. Earlier today, prior to arrival at the clinic, she went to use the bathroom, and when she wiped she noticed blood on the toilet paper. She was last seen by a gynecologist "many years ago" and is uncertain of when her last PAP smear was performed. She has had 2 children, vaginally, and has 4 grandchildren.The patient has not had any miscarriages. She is uncertain of when her mother went through menopause. The patient denies any gynecological surgeries, DNCs, nor history of ovarian cysts. Patient states that she has not taken any hormones in the past. She did take BCP when she was younger but it caused her to gain weight so discontinued taking BCP. Patient reports that she used/uses condoms for contraception. Denies any FHx of breast or gynecological cancers. No other associated sx or complaints reported at this time.     Review of Systems  Gastrointestinal: Positive for abdominal pain.  Genitourinary: Positive for vaginal bleeding and menstrual problem.       Objective:   Physical Exam  Constitutional: She appears well-developed and well-nourished. No distress.  HENT:  Head: Normocephalic and atraumatic.  Abdominal: Soft. She exhibits no distension and no mass.  Genitourinary: Vagina normal. Pelvic exam was performed with patient supine. No labial fusion. There is no rash, tenderness, lesion or injury on the right labia. There is no rash, tenderness, lesion or injury on the left labia. Uterus is deviated, enlarged and tender. Right adnexum displays no mass, no tenderness and no fullness. Left adnexum  displays no mass, no tenderness and no fullness.  Chaperone present Eli Hose, PA-S)  Cervix normal, no polyp Uterus Enlarged and deviated to R No adnexal mass  Skin: She is not diaphoretic.  Psychiatric: Her behavior is normal. Judgment and thought content normal. Cognition and memory are normal.          Assessment & Plan:  50 yo G2P2 AA F with menometrorrhagia, possible related to fibroids or enlarged uterus. PAP done today, eval for cervical cancer risks. Plan Ultrasound for further assessment. Consider Endometrial Biopsy. Treatment based on results.  No estrogen based on HTN.  Consider Provera or Depo Provera for bleeding.  If risk for cancer or large fibroids, then surgery will also need to be considered.

## 2016-01-25 ENCOUNTER — Ambulatory Visit: Payer: Self-pay | Admitting: Internal Medicine

## 2016-01-25 VITALS — BP 173/95 | HR 82 | Temp 97.9°F | Wt 241.0 lb

## 2016-01-25 DIAGNOSIS — D649 Anemia, unspecified: Secondary | ICD-10-CM

## 2016-01-25 NOTE — Patient Instructions (Signed)
Patient to return to clinic in six weeks for follow up for hypertension and anemia. Will perform labs also that day.

## 2016-01-25 NOTE — Progress Notes (Signed)
   Subjective:    Patient ID: Cindy Burke, female    DOB: 08/19/1966, 50 y.o.   MRN: [id number]  HPI  Patient presents today with hypertension Patient presents today with anemia. Patient Active Problem List   Diagnosis Date Noted  . Essential hypertension 12/15/2015  . Heavy menstrual bleeding 12/15/2015    Review of Systems     Objective:   Physical Exam  Constitutional: She is oriented to person, place, and time.  Cardiovascular: Normal rate and regular rhythm.  Exam reveals gallop.   Pulmonary/Chest: Effort normal.  Neurological: She is alert and oriented to person, place, and time.    BP 173/95 mmHg  Pulse 82  Temp(Src) 97.9 F (36.6 C)  Wt 241 lb (109.317 kg)  LMP 01/24/2016    Medication List       This list is accurate as of: 01/25/16  9:51 AM.  Always use your most recent med list.               doxycycline 50 MG capsule  Commonly known as:  VIBRAMYCIN  Take 2 capsules (100 mg total) by mouth 2 (two) times daily.     ferrous gluconate 324 MG tablet  Commonly known as:  FERGON  Take 1 tablet (324 mg total) by mouth daily with breakfast.     hydrochlorothiazide 25 MG tablet  Commonly known as:  HYDRODIURIL  Take 25 mg by mouth daily. Reported on 01/24/2016     lisinopril 5 MG tablet  Commonly known as:  PRINIVIL,ZESTRIL  Take 1 tablet (5 mg total) by mouth daily.          Assessment & Plan:  Patient advised to take iron due to heavy menstral bleeding. Patient advised to return to clinic 6 weeks cbc, metc lipid panel a1c, tsh, ua r

## 2016-01-27 ENCOUNTER — Other Ambulatory Visit: Payer: Self-pay | Admitting: Obstetrics & Gynecology

## 2016-01-27 DIAGNOSIS — N921 Excessive and frequent menstruation with irregular cycle: Secondary | ICD-10-CM

## 2016-01-30 LAB — PAP LB, RFX HPV ASCU: PAP SMEAR COMMENT: 0

## 2016-02-06 ENCOUNTER — Ambulatory Visit
Admission: RE | Admit: 2016-02-06 | Discharge: 2016-02-06 | Disposition: A | Discharge status: Home / Self Care | Payer: Self-pay | Source: Ambulatory Visit | Attending: Obstetrics & Gynecology | Admitting: Obstetrics & Gynecology

## 2016-02-06 DIAGNOSIS — N921 Excessive and frequent menstruation with irregular cycle: Secondary | ICD-10-CM | POA: Insufficient documentation

## 2016-02-06 DIAGNOSIS — E278 Other specified disorders of adrenal gland: Secondary | ICD-10-CM | POA: Insufficient documentation

## 2016-02-21 ENCOUNTER — Ambulatory Visit: Payer: Self-pay

## 2016-02-21 ENCOUNTER — Ambulatory Visit: Payer: Self-pay | Admitting: Obstetrics & Gynecology

## 2016-02-21 VITALS — BP 190/100 | HR 83 | Wt 257.0 lb

## 2016-02-21 DIAGNOSIS — N83209 Unspecified ovarian cyst, unspecified side: Secondary | ICD-10-CM

## 2016-02-21 DIAGNOSIS — N920 Excessive and frequent menstruation with regular cycle: Secondary | ICD-10-CM

## 2016-02-21 NOTE — Progress Notes (Signed)
Patient ID: Cindy Burke, female   DOB: 04/01/1966, 50 y.o.   MRN: [id number]    Patient: Cindy Burke Female    DOB: 1966/02/11   50 y.o.   MRN: [id number] Visit Date: 02/21/2016  Today's Provider: [facility]   Chief Complaint  Patient presents with  . Gynecologic Exam    review test results   Subjective:    HPI  Pt comes in today to discuss the results of her U/S.    Left 1.2 cm cyst.  ES 12 mm. No fibroids.    No Known Allergies Previous Medications   DOXYCYCLINE (VIBRAMYCIN) 50 MG CAPSULE    Take 2 capsules (100 mg total) by mouth 2 (two) times daily.   FERROUS GLUCONATE (FERGON) 324 MG TABLET    Take 1 tablet (324 mg total) by mouth daily with breakfast.   HYDROCHLOROTHIAZIDE (HYDRODIURIL) 25 MG TABLET    Take 25 mg by mouth daily. Reported on 01/24/2016   LISINOPRIL (PRINIVIL,ZESTRIL) 5 MG TABLET    Take 1 tablet (5 mg total) by mouth daily.    Review of Systems  Social History  Substance Use Topics  . Smoking status: Current Some Day Smoker -- 0.25 packs/day    Types: Cigarettes  . Smokeless tobacco: Not on file  . Alcohol Use: 0.6 oz/week    1 Cans of beer per week   Objective:   BP 190/100 mmHg  Pulse 83  Wt 257 lb (116.574 kg)  LMP 01/24/2016  Physical Exam  Constitutional: She is oriented to person, place, and time. She appears well-developed and well-nourished.  Neurological: She is alert and oriented to person, place, and time.  Skin: Skin is warm and dry.  Psychiatric: She has a normal mood and affect. Her behavior is normal. Judgment and thought content normal.        Assessment & Plan:     1. Cyst of ovary, unspecified laterality Suspect Benign; but will check  - CA 125  2. Menorrhagia with regular cycle Plan EMB next visit (no supoplies here today) Risk of cancer low but needs to be sampled with 12 mm ES and obesity and AUB        [facility]

## 2016-02-22 LAB — CA 125: CA 125: 10.9 U/mL (ref 0.0–38.1)

## 2016-02-28 ENCOUNTER — Other Ambulatory Visit: Payer: Self-pay

## 2016-02-28 DIAGNOSIS — I10 Essential (primary) hypertension: Secondary | ICD-10-CM

## 2016-02-28 MED ORDER — LISINOPRIL 5 MG PO TABS: 10.0000 mg | ORAL_TABLET | Freq: Every day | ORAL | Status: DC

## 2016-03-01 ENCOUNTER — Other Ambulatory Visit: Payer: Self-pay

## 2016-03-01 DIAGNOSIS — I10 Essential (primary) hypertension: Secondary | ICD-10-CM

## 2016-03-01 MED ORDER — LISINOPRIL 5 MG PO TABS: 10.0000 mg | ORAL_TABLET | Freq: Every day | ORAL | Status: DC

## 2016-03-07 ENCOUNTER — Ambulatory Visit: Payer: Self-pay | Admitting: Internal Medicine

## 2016-03-07 VITALS — BP 200/100 | HR 74 | Wt 262.0 lb

## 2016-03-07 DIAGNOSIS — I10 Essential (primary) hypertension: Secondary | ICD-10-CM

## 2016-03-07 MED ORDER — LISINOPRIL 10 MG PO TABS
10.0000 mg | ORAL_TABLET | Freq: Every day | ORAL | Status: DC
Start: 2016-03-07 — End: 2016-06-08

## 2016-03-07 MED ORDER — HYDROCHLOROTHIAZIDE 25 MG PO TABS: 25.0000 mg | ORAL_TABLET | Freq: Every day | ORAL | Status: DC

## 2016-03-07 NOTE — Progress Notes (Signed)
   Subjective:    Patient ID: Cindy Burke, female    DOB: 13-Aug-1966, 50 y.o.   MRN: [id number]  HPI  Patient Active Problem List   Diagnosis Date Noted  . Essential hypertension 12/15/2015  . Heavy menstrual bleeding 12/15/2015   Pt presents with a f/u for hypertension. Pt was given 5 mg Lisinopril but has only been taking 1 tab a day. Blood pressure was high this morning.   Review of Systems     Objective:   Physical Exam  Constitutional: She is oriented to person, place, and time.  Pulmonary/Chest: Effort normal and breath sounds normal.  Neurological: She is alert and oriented to person, place, and time.    BP 200/100 mmHg  Pulse 74  Wt 262 lb (118.842 kg)  Outpatient Encounter Prescriptions as of 03/07/2016  Medication Sig Note  . ferrous gluconate (FERGON) 324 MG tablet Take 1 tablet (324 mg total) by mouth daily with breakfast. 01/24/2016: Pt not currently taking, but still has some at home  . lisinopril (PRINIVIL,ZESTRIL) 5 MG tablet Take 2 tablets (10 mg total) by mouth daily. (Patient taking differently: Take 5 mg by mouth daily. )   . doxycycline (VIBRAMYCIN) 50 MG capsule Take 2 capsules (100 mg total) by mouth 2 (two) times daily. (Patient not taking: Reported on 03/07/2016)   . hydrochlorothiazide (HYDRODIURIL) 25 MG tablet Take 25 mg by mouth daily. Reported on 03/07/2016 01/24/2016: Patient ran out of medication   No facility-administered encounter medications on file as of 03/07/2016.       Assessment & Plan:  Pt advised to take 2 tabs of the 5 mg Lisinopril so she gets 10 mg Lisinopril.   Reordered 10 mg of Lisinopril and reordered HCTZ.   Cindy Burke was seen today for hypertension.  Diagnoses and all orders for this visit:  Essential hypertension  Other orders -     lisinopril (PRINIVIL,ZESTRIL) 10 MG tablet; Take 1 tablet (10 mg total) by mouth daily. -     hydrochlorothiazide (HYDRODIURIL) 25 MG tablet; Take 1 tablet (25 mg total) by mouth daily.  Reported on 03/07/2016     Md f/u in 1 week to recheck blood pressure. Advised to reduce sodium intake through food.

## 2016-03-14 ENCOUNTER — Ambulatory Visit: Payer: Self-pay | Admitting: Internal Medicine

## 2016-06-06 ENCOUNTER — Emergency Department: Payer: Self-pay

## 2016-06-06 ENCOUNTER — Inpatient Hospital Stay: Payer: Self-pay

## 2016-06-06 ENCOUNTER — Inpatient Hospital Stay
Admission: EM | Admit: 2016-06-06 | Discharge: 2016-06-08 | DRG: 690 | Disposition: A | Discharge status: Home / Self Care | Payer: Self-pay | Attending: Specialist | Admitting: Specialist

## 2016-06-06 ENCOUNTER — Inpatient Hospital Stay (HOSPITAL_COMMUNITY)
Admit: 2016-06-06 | Discharge: 2016-06-06 | Disposition: A | Discharge status: Home / Self Care | Payer: Self-pay | Attending: Internal Medicine | Admitting: Internal Medicine

## 2016-06-06 DIAGNOSIS — E876 Hypokalemia: Secondary | ICD-10-CM | POA: Diagnosis present

## 2016-06-06 DIAGNOSIS — Z8249 Family history of ischemic heart disease and other diseases of the circulatory system: Secondary | ICD-10-CM

## 2016-06-06 DIAGNOSIS — Z833 Family history of diabetes mellitus: Secondary | ICD-10-CM

## 2016-06-06 DIAGNOSIS — R4182 Altered mental status, unspecified: Secondary | ICD-10-CM

## 2016-06-06 DIAGNOSIS — F17210 Nicotine dependence, cigarettes, uncomplicated: Secondary | ICD-10-CM | POA: Diagnosis present

## 2016-06-06 DIAGNOSIS — D509 Iron deficiency anemia, unspecified: Secondary | ICD-10-CM | POA: Diagnosis present

## 2016-06-06 DIAGNOSIS — M545 Low back pain: Secondary | ICD-10-CM | POA: Diagnosis present

## 2016-06-06 DIAGNOSIS — W19XXXA Unspecified fall, initial encounter: Secondary | ICD-10-CM

## 2016-06-06 DIAGNOSIS — I10 Essential (primary) hypertension: Secondary | ICD-10-CM | POA: Diagnosis present

## 2016-06-06 DIAGNOSIS — Z9181 History of falling: Secondary | ICD-10-CM

## 2016-06-06 DIAGNOSIS — R112 Nausea with vomiting, unspecified: Secondary | ICD-10-CM

## 2016-06-06 DIAGNOSIS — R55 Syncope and collapse: Secondary | ICD-10-CM | POA: Diagnosis present

## 2016-06-06 DIAGNOSIS — Z79899 Other long term (current) drug therapy: Secondary | ICD-10-CM

## 2016-06-06 DIAGNOSIS — T502X5A Adverse effect of carbonic-anhydrase inhibitors, benzothiadiazides and other diuretics, initial encounter: Secondary | ICD-10-CM | POA: Diagnosis present

## 2016-06-06 DIAGNOSIS — Z809 Family history of malignant neoplasm, unspecified: Secondary | ICD-10-CM

## 2016-06-06 DIAGNOSIS — N390 Urinary tract infection, site not specified: Principal | ICD-10-CM | POA: Diagnosis present

## 2016-06-06 LAB — URINALYSIS COMPLETE WITH MICROSCOPIC (ARMC ONLY)
Bilirubin Urine: NEGATIVE
Glucose, UA: NEGATIVE mg/dL
Ketones, ur: NEGATIVE mg/dL
Nitrite: NEGATIVE
PH: 6 (ref 5.0–8.0)
PROTEIN: 100 mg/dL — AB
Specific Gravity, Urine: 1.023 (ref 1.005–1.030)

## 2016-06-06 LAB — COMPREHENSIVE METABOLIC PANEL
ALBUMIN: 4.2 g/dL (ref 3.5–5.0)
ALK PHOS: 65 U/L (ref 38–126)
ALT: 17 U/L (ref 14–54)
AST: 23 U/L (ref 15–41)
Anion gap: 5 (ref 5–15)
BILIRUBIN TOTAL: 0.7 mg/dL (ref 0.3–1.2)
BUN: 22 mg/dL — AB (ref 6–20)
CO2: 30 mmol/L (ref 22–32)
Calcium: 9 mg/dL (ref 8.9–10.3)
Chloride: 104 mmol/L (ref 101–111)
Creatinine, Ser: 0.88 mg/dL (ref 0.44–1.00)
GFR calc Af Amer: >60 mL/min (ref >60)
GFR calc non Af Amer: >60 mL/min (ref >60)
GLUCOSE: 124 mg/dL — AB (ref 65–99)
POTASSIUM: 2.5 mmol/L — AB (ref 3.5–5.1)
Sodium: 139 mmol/L (ref 135–145)
Total Protein: 7.8 g/dL (ref 6.5–8.1)

## 2016-06-06 LAB — CBC
HEMATOCRIT: 30.6 % — AB (ref 35.0–47.0)
Hemoglobin: 9.6 g/dL — ABNORMAL LOW (ref 12.0–16.0)
MCH: 18.8 pg — AB (ref 26.0–34.0)
MCHC: 31.4 g/dL — AB (ref 32.0–36.0)
MCV: 59.9 fL — AB (ref 80.0–100.0)
Platelets: 220 10*3/uL (ref 150–440)
RBC: 5.1 MIL/uL (ref 3.80–5.20)
RDW: 21 % — AB (ref 11.5–14.5)
WBC: 7.9 10*3/uL (ref 3.6–11.0)

## 2016-06-06 LAB — MAGNESIUM: MAGNESIUM: 2.1 mg/dL (ref 1.7–2.4)

## 2016-06-06 LAB — TROPONIN I: Troponin I: <0.03 ng/mL (ref <0.03)

## 2016-06-06 LAB — LIPASE, BLOOD: Lipase: 17 U/L (ref 11–51)

## 2016-06-06 MED ORDER — POTASSIUM CHLORIDE CRYS ER 20 MEQ PO TBCR
40.0000 meq | EXTENDED_RELEASE_TABLET | Freq: Once | ORAL | Status: AC
  Administered 2016-06-06: 40 meq via ORAL
  Filled 2016-06-06: qty 2

## 2016-06-06 MED ORDER — DEXTROSE 5 % IV SOLN
1.0000 g | Freq: Once | INTRAVENOUS | Status: AC
  Administered 2016-06-06: 1 g via INTRAVENOUS
  Filled 2016-06-06: qty 10

## 2016-06-06 MED ORDER — LISINOPRIL 10 MG PO TABS
10.0000 mg | ORAL_TABLET | Freq: Every day | ORAL | Status: DC
  Administered 2016-06-06 – 2016-06-08 (×3): 10 mg via ORAL
  Filled 2016-06-06 (×3): qty 1

## 2016-06-06 MED ORDER — POTASSIUM CHLORIDE IN NACL 20-0.45 MEQ/L-% IV SOLN
INTRAVENOUS | Status: DC
  Administered 2016-06-06 – 2016-06-08 (×4): via INTRAVENOUS
  Filled 2016-06-06 (×5): qty 1000

## 2016-06-06 MED ORDER — ACETAMINOPHEN 325 MG PO TABS: 650.0000 mg | ORAL_TABLET | Freq: Four times a day (QID) | ORAL | Status: DC | PRN

## 2016-06-06 MED ORDER — DOCUSATE SODIUM 100 MG PO CAPS
100.0000 mg | ORAL_CAPSULE | Freq: Two times a day (BID) | ORAL | Status: DC
  Administered 2016-06-07 – 2016-06-08 (×3): 100 mg via ORAL
  Filled 2016-06-06 (×4): qty 1

## 2016-06-06 MED ORDER — SODIUM CHLORIDE 0.9 % IV BOLUS (SEPSIS)
1000.0000 mL | Freq: Once | INTRAVENOUS | Status: AC
  Administered 2016-06-06: 1000 mL via INTRAVENOUS

## 2016-06-06 MED ORDER — KETOROLAC TROMETHAMINE 30 MG/ML IJ SOLN
30.0000 mg | Freq: Four times a day (QID) | INTRAMUSCULAR | Status: DC | PRN
  Administered 2016-06-07: 30 mg via INTRAVENOUS
  Filled 2016-06-06: qty 1

## 2016-06-06 MED ORDER — HYDROCODONE-ACETAMINOPHEN 5-325 MG PO TABS
1.0000 | ORAL_TABLET | ORAL | Status: DC | PRN
  Administered 2016-06-06: 2 via ORAL
  Administered 2016-06-06: 1 via ORAL
  Administered 2016-06-07 – 2016-06-08 (×5): 2 via ORAL
  Filled 2016-06-06 (×6): qty 2
  Filled 2016-06-06: qty 1
  Filled 2016-06-06: qty 2

## 2016-06-06 MED ORDER — BISACODYL 5 MG PO TBEC: 5.0000 mg | DELAYED_RELEASE_TABLET | Freq: Every day | ORAL | Status: DC | PRN

## 2016-06-06 MED ORDER — TRAZODONE HCL 50 MG PO TABS: 25.0000 mg | ORAL_TABLET | Freq: Every evening | ORAL | Status: DC | PRN

## 2016-06-06 MED ORDER — POTASSIUM CHLORIDE 10 MEQ/100ML IV SOLN
10.0000 meq | Freq: Once | INTRAVENOUS | Status: AC
  Administered 2016-06-06: 10 meq via INTRAVENOUS
  Filled 2016-06-06: qty 100

## 2016-06-06 MED ORDER — ENOXAPARIN SODIUM 40 MG/0.4ML ~~LOC~~ SOLN
40.0000 mg | Freq: Two times a day (BID) | SUBCUTANEOUS | Status: DC
  Administered 2016-06-06 – 2016-06-08 (×4): 40 mg via SUBCUTANEOUS
  Filled 2016-06-06 (×4): qty 0.4

## 2016-06-06 MED ORDER — FERROUS GLUCONATE 324 (38 FE) MG PO TABS
324.0000 mg | ORAL_TABLET | Freq: Every day | ORAL | Status: DC
  Administered 2016-06-07 – 2016-06-08 (×2): 324 mg via ORAL
  Filled 2016-06-06 (×3): qty 1

## 2016-06-06 MED ORDER — ONDANSETRON HCL 4 MG/2ML IJ SOLN
4.0000 mg | Freq: Four times a day (QID) | INTRAMUSCULAR | Status: DC | PRN
  Administered 2016-06-08: 4 mg via INTRAVENOUS
  Filled 2016-06-06: qty 2

## 2016-06-06 MED ORDER — ONDANSETRON HCL 4 MG PO TABS: 4.0000 mg | ORAL_TABLET | Freq: Four times a day (QID) | ORAL | Status: DC | PRN

## 2016-06-06 MED ORDER — DEXTROSE 5 % IV SOLN
1.0000 g | INTRAVENOUS | Status: DC
  Administered 2016-06-07: 1 g via INTRAVENOUS
  Filled 2016-06-06 (×2): qty 10

## 2016-06-06 MED ORDER — ACETAMINOPHEN 650 MG RE SUPP: 650.0000 mg | Freq: Four times a day (QID) | RECTAL | Status: DC | PRN

## 2016-06-06 NOTE — ED Provider Notes (Addendum)
[facility] Emergency Department Provider Note  ____________________________________________   I have reviewed the triage vital signs and the nursing notes.   HISTORY  Chief Complaint Fall and Altered Mental Status    HPI Cindy Burke is a 50 y.o. female who has a history of hypertension no history of seizure disorder denies any recreational drug abuse denies any other significant medical problems per 100 and menstrual bleeding since the last 2-3 or 4 days days she's had left-sided abdominal pain, today she "passed out" and she thinks she may have hit her head. Abdominal pain is very vaguely described, gradual in onset, nonradiating, nothing seems to make it better or worse. Apparently, patient was somewhat confused upon arrival but at this time she is awake and alert and oriented to name and month and year. She states she has low back pain, abdominal pain and mild headache after her fall.      Past Medical History:  Diagnosis Date  . Hypertension     Patient Active Problem List   Diagnosis Date Noted  . Essential hypertension 12/15/2015  . Heavy menstrual bleeding 12/15/2015    History reviewed. No pertinent surgical history.  Current Outpatient Rx  . Order #: [id number] Class: Print  . Order #: [id number] Class: Print  . Order #: [id number] Class: Normal  . Order #: [id number] Class: Normal    Allergies Review of patient's allergies indicates no known allergies.  Family History  Problem Relation Age of Onset  . Hypertension Mother   . Cancer Mother   . Diabetes Mother   . Hypertension Father   . Heart attack Father   . Diabetes Father     Social History Social History  Substance Use Topics  . Smoking status: Current Some Day Smoker    Packs/day: 0.25    Types: Cigarettes  . Smokeless tobacco: Never Used  . Alcohol use 0.6 oz/week    1 Cans of beer per week    Review of Systems Constitutional: No fever/chills Eyes: No visual  changes. ENT: No sore throat. No stiff neck no neck pain Cardiovascular: Denies chest pain. Respiratory: Denies shortness of breath. Gastrointestinal:   no vomiting.  No diarrhea.  No constipation. Genitourinary: Negative for dysuria. Musculoskeletal: Negative lower extremity swelling Skin: Negative for rash. Neurological: Negative for severe headaches, focal weakness or numbness. 10-point ROS otherwise negative.  ____________________________________________   PHYSICAL EXAM:  VITAL SIGNS: ED Triage Vitals  Enc Vitals Group     BP 06/06/16 1303 (!) 147/82     Pulse Rate 06/06/16 1303 84     Resp 06/06/16 1303 18     Temp 06/06/16 1303 98.2 F (36.8 C)     Temp Source 06/06/16 1303 Oral     SpO2 06/06/16 1303 98 %     Weight 06/06/16 1303 262 lb (118.8 kg)     Height 06/06/16 1303 5\' 6"  (1.676 m)     Head Circumference --      Peak Flow --      Pain Score 06/06/16 1304 9     Pain Loc --      Pain Edu? --      Excl. in [place]? --     Constitutional: Alert and oriented. Well appearing and in no acute distress.Tearful but otherwise nontoxic Eyes: Conjunctivae are normal. PERRL. EOMI. Head: Atraumatic. Nose: No congestion/rhinnorhea. Mouth/Throat: Mucous membranes are moist.  Oropharynx non-erythematous. Neck: No stridor.   Nontender with no meningismus Cardiovascular: Normal rate, regular rhythm. Grossly normal heart sounds.  Good peripheral circulation. Respiratory: Normal respiratory effort.  No retractions. Lungs CTAB. Abdominal: Soft and nontender to palpation left lower quadrant. Morbid obesity noted.. No distention. No guarding no rebound Back:  There is some left paraspinal tenderness noted. there is no midline tenderness there are no lesions noted. there is no CVA tenderness Musculoskeletal: No lower extremity tenderness, no upper extremity tenderness. No joint effusions, no DVT signs strong distal pulses no edema Neurologic:  Normal speech and language. No gross focal  neurologic deficits are appreciated.  Skin:  Skin is warm, dry and intact. No rash noted. Psychiatric: Mood and affect are normal. Speech and behavior are normal.  ____________________________________________   LABS (all labs ordered are listed, but only abnormal results are displayed)  Labs Reviewed  COMPREHENSIVE METABOLIC PANEL  CBC  LIPASE, BLOOD  TROPONIN I  URINALYSIS COMPLETEWITH MICROSCOPIC ([place])  POC URINE PREG, ED   ____________________________________________  EKG  I personally interpreted any EKGs ordered by me or triage Normal sinus rhythm rate 68 bpm, diffuse nonspecific ST waves noted including flipped T waves to 3 aVF and laterally as well, ____________________________________________  RADIOLOGY  I reviewed any imaging ordered by me or triage that were performed during my shift and, if possible, patient and/or family made aware of any abnormal findings. ____________________________________________   PROCEDURES  Procedure(s) performed: None  Procedures  Critical Care performed: None  ____________________________________________   INITIAL IMPRESSION / ASSESSMENT AND PLAN / ED COURSE  Pertinent labs & imaging results that were available during my care of the patient were reviewed by me and considered in my medical decision making (see chart for details).  Patient with multiple different complaints she complains of back pain abdominal pain and headache, apparently she was somewhat confused upon arrival this time she is awake alert and oriented and in no acute distress. Not sure if she had a seizure similar pathology. We'll check a broad workup and further evaluate.  Clinical Course   ____________________________________________   FINAL CLINICAL IMPRESSION(S) / ED DIAGNOSES  Final diagnoses:  None      This chart was dictated using voice recognition software.  Despite best efforts to proofread,  errors can occur which can change meaning.       Schuyler Amor, MD 06/06/16 [place], MD 06/06/16 [phone]

## 2016-06-06 NOTE — ED Notes (Signed)
Checked compatibility of Potassium and Rocephin by calling pharmacy

## 2016-06-06 NOTE — H&P (Signed)
[place] [facility] [place] NAME: Cindy Burke    MR#:  [id number]  DATE OF BIRTH:  [birth year]  DATE OF ADMISSION:  06/06/2016  PRIMARY CARE PHYSICIAN: No PCP Per Patient   REQUESTING/REFERRING PHYSICIAN: Dr. Burlene Arnt   CHIEF COMPLAINT: Passed out    Chief Complaint  Patient presents with  . Fall  . Altered Mental Status    HISTORY OF PRESENT ILLNESS:  Cindy Burke  is a 50 y.o. female with a known history of Essential hypertension passed out today morning in the bathroom. No history of seizures reported. No cough or shortness of breath. No headache. Found to have hypokalemia, UTI.passed out early morning in the bathroom after she voided,  She  is crying at this time and complaining of back pain.  No Dysuria or hematuria.  PAST MEDICAL HISTORY:   Past Medical History:  Diagnosis Date  . Hypertension     PAST SURGICAL HISTOIRY:  History reviewed. No pertinent surgical history.  SOCIAL HISTORY:   Social History  Substance Use Topics  . Smoking status: Current Some Day Smoker    Packs/day: 0.25    Types: Cigarettes  . Smokeless tobacco: Never Used  . Alcohol use 0.6 oz/week    1 Cans of beer per week    FAMILY HISTORY:   Family History  Problem Relation Age of Onset  . Hypertension Mother   . Cancer Mother   . Diabetes Mother   . Hypertension Father   . Heart attack Father   . Diabetes Father     DRUG ALLERGIES:  No Known Allergies  REVIEW OF SYSTEMS:  CONSTITUTIONAL: No fever, fatigue or weakness.  EYES: No blurred or double vision.  EARS, NOSE, AND THROAT: No tinnitus or ear pain.  RESPIRATORY: No cough, shortness of breath, wheezing or hemoptysis.  CARDIOVASCULAR: No chest pain, orthopnea, edema.  GASTROINTESTINAL: No nausea, vomiting, diarrhea or abdominal pain.  GENITOURINARY: No dysuria, hematuria.  ENDOCRINE: No polyuria, nocturia,  HEMATOLOGY: No anemia, easy bruising or bleeding SKIN: No rash or  lesion. MUSCULOSKELETAL:Complains of low back pain  NEUROLOGIC: No tingling, numbness, weakness. Syncope this morning without any neurological deficit. PSYCHIATRY:.  anxious.  MEDICATIONS AT HOME:   Prior to Admission medications   Medication Sig Start Date End Date Taking? Authorizing Provider  lisinopril (PRINIVIL,ZESTRIL) 10 MG tablet Take 1 tablet (10 mg total) by mouth daily. 03/07/16  Yes Tawni Millers, MD      VITAL SIGNS:  Blood pressure (!) 161/93, pulse 64, temperature 98.2 F (36.8 C), temperature source Oral, resp. rate 16, height 5\' 6"  (1.676 m), weight 118.8 kg (262 lb), last menstrual period 06/03/2016, SpO2 100 %.  PHYSICAL EXAMINATION:  GENERAL:  50 y.o.-year-old patient lying in the bed with no acute distress. Crying and c/o back pain  EYES: Pupils equal, round, reactive to light and accommodation. No scleral icterus. Extraocular muscles intact.  HEENT: Head atraumatic, normocephalic. Oropharynx and nasopharynx clear.  NECK:  Supple, no jugular venous distention. No thyroid enlargement, no tenderness.  LUNGS: Normal breath sounds bilaterally, no wheezing, rales,rhonchi or crepitation. No use of accessory muscles of respiration.  CARDIOVASCULAR: S1, S2 normal. No murmurs, rubs, or gallops.  ABDOMEN: Soft, nontender, nondistended. Bowel sounds present. No organomegaly or mass.  EXTREMITIES: No pedal edema, cyanosis, or clubbing.  NEUROLOGIC: Cranial nerves II through XII are intact. Muscle strength 5/5 in all extremities. Sensation intact. Gait not checked.  tenderness lower back area. PSYCHIATRIC: The patient is  alert and oriented x 3.  SKIN: No obvious rash, lesion, or ulcer.   LABORATORY PANEL:   CBC  Recent Labs Lab 06/06/16 1301  WBC 7.9  HGB 9.6*  HCT 30.6*  PLT 220   ------------------------------------------------------------------------------------------------------------------  Chemistries   Recent Labs Lab 06/06/16 1301  NA 139  K 2.5*  CL  104  CO2 30  GLUCOSE 124*  BUN 22*  CREATININE 0.88  CALCIUM 9.0  MG 2.1  AST 23  ALT 17  ALKPHOS 65  BILITOT 0.7   ------------------------------------------------------------------------------------------------------------------  Cardiac Enzymes  Recent Labs Lab 06/06/16 1301  TROPONINI <0.03   ------------------------------------------------------------------------------------------------------------------  RADIOLOGY:  Ct Abdomen Pelvis Wo Contrast  Result Date: 06/06/2016 CLINICAL DATA:  Low back pain and left-sided abdominal pain after a fall today. EXAM: CT ABDOMEN AND PELVIS WITHOUT CONTRAST TECHNIQUE: Multidetector CT imaging of the abdomen and pelvis was performed following the standard protocol without IV contrast. COMPARISON:  Pelvic ultrasound dated 02/06/2016 FINDINGS: Lower chest:  Small hiatal hernia.  Otherwise normal. Hepatobiliary: Normal. Pancreas: Normal. Spleen: Normal. Adrenals/Urinary Tract: Normal. Stomach/Bowel: Small hiatal hernia.  Otherwise normal. Vascular/Lymphatic: No pathologically enlarged lymph nodes. No evidence of abdominal aortic aneurysm. Reproductive: Uterus and left ovary are normal. 2.2 cm low-density area in the right ovary, probably a cyst. Other: No free air or free fluid. Musculoskeletal:  No significant abnormalities. IMPRESSION: Benign-appearing abdomen and pelvis.  Small hiatal hernia. Electronically Signed   By: Lorriane Shire M.D.   On: 06/06/2016 15:14   Ct Head Wo Contrast  Result Date: 06/06/2016 CLINICAL DATA:  Syncope today. Possible head trauma. Confusion. Seizure disorder. EXAM: CT HEAD WITHOUT CONTRAST TECHNIQUE: Contiguous axial images were obtained from the base of the skull through the vertex without intravenous contrast. COMPARISON:  None. FINDINGS: Brain: There is no acute intracranial hemorrhage, acute infarction, or intracranial mass lesion. Brain parenchyma is normal. Vascular: Normal. Skull: Normal. Sinuses/Orbits:  Normal. Other: No scalp contusion or hematoma. IMPRESSION: Normal exam. Electronically Signed   By: Lorriane Shire M.D.   On: 06/06/2016 15:08    EKG:   Orders placed or performed during the hospital encounter of 06/06/16  . ED EKG  . ED EKG  . EKG 12-Lead  . EKG 12-Lead  . EKG 12-Lead  . EKG 12-Lead  Normal sinus rhythm 66 bpm with T-wave inversion in V5, V6.  IMPRESSION AND PLAN:  #1 syncope secondary to hypokalemia, UTI: Admitted to telemetry, continue IV hydration with potassium, monitor on telemetry for arrhythmias. Check echocardiogram, ultrasound of carotids.,ct head unremarkable.  Patient stopped taking lisinopril about 2 weeks ago due to financial reasons #2 hypokalemia secondary to Diuretics;: Replace the potassium,hold hctz 3. Low back pain follow: X-ray of the LS spine ordered. Use Toradol for pain control. #3 UTI ;continue Rocephin;, follow urine cultures. GI, DVT prophylaxis    All the records are reviewed and case discussed with ED provider. Management plans discussed with the patient, family and they are in agreement.  CODE STATUS: Full code  TOTAL TIME TAKING CARE OF THIS PATIENT: 55 minutes.    Epifanio Lesches M.D on 06/06/2016 at 4:05 PM  Between 7am to 6pm - Pager - [phone]  After 6pm go to [email or web address] - password [email or web address]  [facility]  Office  [phone]  CC: Primary care physician; No PCP Per Patient  Note: This dictation was prepared with Dragon dictation along with smaller phrase technology. Any transcriptional errors that result from this process are unintentional.

## 2016-06-06 NOTE — Progress Notes (Signed)
Adjusted enoxaparin to 40mg  BID based on patients BMI > 40.   Loree Fee 4:52 PM 06/06/2016

## 2016-06-06 NOTE — ED Triage Notes (Signed)
Pt arrives to ER via [facility] from home c/o AMS. Pt family reports pt has been confused that began yesterday. Pt has had multiple falls since. Pt c/o lower back pain since falls. Pt able to provide hx of why she is here and what happened but cannot recall what year or month it is. Pt tearful. CBG 131 with [facility].

## 2016-06-06 NOTE — ED Notes (Signed)
MD at bedside. 

## 2016-06-06 NOTE — ED Notes (Signed)
Pt alert and oriented to person, place, time, and situation. Pt states "I was at my daughters house when I fell out. I was going to the bathroom." Pt stated she has had several other occasions of passing out while sitting. Pt complains of back pain.  Pt denies all other symptoms at time of assessment.

## 2016-06-06 NOTE — ED Notes (Signed)
Critical potassium of 2.5. MD McShane notified.

## 2016-06-06 NOTE — ED Notes (Signed)
Helped pt to bathroom. Gave blanket.

## 2016-06-07 ENCOUNTER — Inpatient Hospital Stay: Payer: Self-pay

## 2016-06-07 LAB — BASIC METABOLIC PANEL
Anion gap: 6 (ref 5–15)
BUN: 19 mg/dL (ref 6–20)
CALCIUM: 8.2 mg/dL — AB (ref 8.9–10.3)
CO2: 28 mmol/L (ref 22–32)
CREATININE: 0.83 mg/dL (ref 0.44–1.00)
Chloride: 105 mmol/L (ref 101–111)
GFR calc non Af Amer: >60 mL/min (ref >60)
Glucose, Bld: 94 mg/dL (ref 65–99)
Potassium: 2.8 mmol/L — ABNORMAL LOW (ref 3.5–5.1)
SODIUM: 139 mmol/L (ref 135–145)

## 2016-06-07 LAB — FOLATE: FOLATE: 16.6 ng/mL (ref >5.9)

## 2016-06-07 LAB — CBC
HEMATOCRIT: 28.9 % — AB (ref 35.0–47.0)
HEMOGLOBIN: 8.8 g/dL — AB (ref 12.0–16.0)
MCH: 18.8 pg — ABNORMAL LOW (ref 26.0–34.0)
MCHC: 30.6 g/dL — AB (ref 32.0–36.0)
MCV: 61.6 fL — ABNORMAL LOW (ref 80.0–100.0)
Platelets: 238 10*3/uL (ref 150–440)
RBC: 4.69 MIL/uL (ref 3.80–5.20)
RDW: 21 % — ABNORMAL HIGH (ref 11.5–14.5)
WBC: 8.5 10*3/uL (ref 3.6–11.0)

## 2016-06-07 LAB — IRON AND TIBC
Iron: 21 ug/dL — ABNORMAL LOW (ref 28–170)
SATURATION RATIOS: 5 % — AB (ref 10.4–31.8)
TIBC: 417 ug/dL (ref 250–450)
UIBC: 396 ug/dL

## 2016-06-07 LAB — RETICULOCYTES
RBC.: 4.41 MIL/uL (ref 3.80–5.20)
RETIC CT PCT: 1.8 % (ref 0.4–3.1)
Retic Count, Absolute: 79.4 10*3/uL (ref 19.0–183.0)

## 2016-06-07 LAB — URINE CULTURE

## 2016-06-07 LAB — FERRITIN: Ferritin: 5 ng/mL — ABNORMAL LOW (ref 11–307)

## 2016-06-07 LAB — ECHOCARDIOGRAM COMPLETE
Height: 66 in
Weight: 3942.4 oz

## 2016-06-07 LAB — VITAMIN B12: VITAMIN B 12: 522 pg/mL (ref 180–914)

## 2016-06-07 LAB — GLUCOSE, CAPILLARY
GLUCOSE-CAPILLARY: 112 mg/dL — AB (ref 65–99)
GLUCOSE-CAPILLARY: 121 mg/dL — AB (ref 65–99)

## 2016-06-07 LAB — MAGNESIUM: MAGNESIUM: 2.1 mg/dL (ref 1.7–2.4)

## 2016-06-07 MED ORDER — POTASSIUM CHLORIDE CRYS ER 20 MEQ PO TBCR
20.0000 meq | EXTENDED_RELEASE_TABLET | Freq: Two times a day (BID) | ORAL | Status: DC
  Administered 2016-06-07 – 2016-06-08 (×3): 20 meq via ORAL
  Filled 2016-06-07 (×3): qty 1

## 2016-06-07 NOTE — Progress Notes (Signed)
Pt remains alert and oriented, stand by assist to BRP, lungs clear to ascultation, NSR on telemetry, complaint of back pain treated with prn medication. Potassium low PO replacement.

## 2016-06-07 NOTE — Progress Notes (Signed)
[place] at [place] NAME: Cindy Burke    MR#:  [id number]  DATE OF BIRTH:  [birth year]  SUBJECTIVE:   Patient here due to weakness and altered mental status and noted to have a urinary tract infection. Feels somewhat weak today. No other complaints presently.  REVIEW OF SYSTEMS:    Review of Systems  Constitutional: Negative for chills and fever.  HENT: Negative for congestion and tinnitus.   Eyes: Negative for blurred vision and double vision.  Respiratory: Negative for cough, shortness of breath and wheezing.   Cardiovascular: Negative for chest pain, orthopnea and PND.  Gastrointestinal: Negative for abdominal pain, diarrhea, nausea and vomiting.  Genitourinary: Negative for dysuria and hematuria.  Neurological: Positive for weakness. Negative for dizziness, sensory change and focal weakness.  All other systems reviewed and are negative.   Nutrition: Heart Healthy Tolerating Diet: Yes Tolerating PT: Await Eval.    DRUG ALLERGIES:  No Known Allergies  VITALS:  Blood pressure (!) 140/59, pulse 64, temperature 98.2 F (36.8 C), temperature source Oral, resp. rate 20, height 5\' 6"  (1.676 m), weight 113.4 kg (250 lb 1.6 oz), last menstrual period 06/05/2016, SpO2 100 %.  PHYSICAL EXAMINATION:   Physical Exam  GENERAL:  49 y.o.-year-old patient lying in the bed in no acute distress.  EYES: Pupils equal, round, reactive to light and accommodation. No scleral icterus. Extraocular muscles intact.  HEENT: Head atraumatic, normocephalic. Oropharynx and nasopharynx clear.  NECK:  Supple, no jugular venous distention. No thyroid enlargement, no tenderness.  LUNGS: Normal breath sounds bilaterally, no wheezing, rales, rhonchi. No use of accessory muscles of respiration.  CARDIOVASCULAR: S1, S2 normal. No murmurs, rubs, or gallops.  ABDOMEN: Soft, nontender, nondistended. Bowel sounds present. No organomegaly or mass.  EXTREMITIES: No  cyanosis, clubbing or edema b/l.    NEUROLOGIC: Cranial nerves II through XII are intact. No focal Motor or sensory deficits b/l.  Globally weak PSYCHIATRIC: The patient is alert and oriented x 3.  SKIN: No obvious rash, lesion, or ulcer.    LABORATORY PANEL:   CBC  Recent Labs Lab 06/07/16 0342  WBC 8.5  HGB 8.8*  HCT 28.9*  PLT 238   ------------------------------------------------------------------------------------------------------------------  Chemistries   Recent Labs Lab 06/06/16 1301 06/07/16 0342  NA 139 139  K 2.5* 2.8*  CL 104 105  CO2 30 28  GLUCOSE 124* 94  BUN 22* 19  CREATININE 0.88 0.83  CALCIUM 9.0 8.2*  MG 2.1 2.1  AST 23  --   ALT 17  --   ALKPHOS 65  --   BILITOT 0.7  --    ------------------------------------------------------------------------------------------------------------------  Cardiac Enzymes  Recent Labs Lab 06/06/16 1301  TROPONINI <0.03   ------------------------------------------------------------------------------------------------------------------  RADIOLOGY:  Ct Abdomen Pelvis Wo Contrast  Result Date: 06/06/2016 CLINICAL DATA:  Low back pain and left-sided abdominal pain after a fall today. EXAM: CT ABDOMEN AND PELVIS WITHOUT CONTRAST TECHNIQUE: Multidetector CT imaging of the abdomen and pelvis was performed following the standard protocol without IV contrast. COMPARISON:  Pelvic ultrasound dated 02/06/2016 FINDINGS: Lower chest:  Small hiatal hernia.  Otherwise normal. Hepatobiliary: Normal. Pancreas: Normal. Spleen: Normal. Adrenals/Urinary Tract: Normal. Stomach/Bowel: Small hiatal hernia.  Otherwise normal. Vascular/Lymphatic: No pathologically enlarged lymph nodes. No evidence of abdominal aortic aneurysm. Reproductive: Uterus and left ovary are normal. 2.2 cm low-density area in the right ovary, probably a cyst. Other: No free air or free fluid. Musculoskeletal:  No significant abnormalities. IMPRESSION:  Benign-appearing abdomen and  pelvis.  Small hiatal hernia. Electronically Signed   By: Lorriane Shire M.D.   On: 06/06/2016 15:14   Dg Lumbar Spine 2-3 Views  Result Date: 06/06/2016 CLINICAL DATA:  50 year old female status post falls from standing. Pain. Initial encounter. EXAM: LUMBAR SPINE - 2-3 VIEW COMPARISON:  CT Abdomen and Pelvis 1448 hours today. FINDINGS: Normal lumbar segmentation. Vertebral height and alignment within normal limits; trace retrolisthesis of L2 on L3. Mild degenerative endplate spurring. Relatively preserved disc spaces. Sacral ala and SI joints appear normal. Visible lower thoracic levels appear intact. IMPRESSION: No acute osseous abnormality identified in the lumbar spine. Electronically Signed   By: Genevie Ann M.D.   On: 06/06/2016 17:22   Ct Head Wo Contrast  Result Date: 06/06/2016 CLINICAL DATA:  Syncope today. Possible head trauma. Confusion. Seizure disorder. EXAM: CT HEAD WITHOUT CONTRAST TECHNIQUE: Contiguous axial images were obtained from the base of the skull through the vertex without intravenous contrast. COMPARISON:  None. FINDINGS: Brain: There is no acute intracranial hemorrhage, acute infarction, or intracranial mass lesion. Brain parenchyma is normal. Vascular: Normal. Skull: Normal. Sinuses/Orbits: Normal. Other: No scalp contusion or hematoma. IMPRESSION: Normal exam. Electronically Signed   By: Lorriane Shire M.D.   On: 06/06/2016 15:08   US Carotid Bilateral  Result Date: 06/07/2016 CLINICAL DATA:  Hypertension. EXAM: BILATERAL CAROTID DUPLEX ULTRASOUND TECHNIQUE: Pearline Cables scale imaging, color Doppler and duplex ultrasound were performed of bilateral carotid and vertebral arteries in the neck. COMPARISON:  CT 06/06/2016. FINDINGS: Criteria: Quantification of carotid stenosis is based on velocity parameters that correlate the residual internal carotid diameter with NASCET-based stenosis levels, using the diameter of the distal internal carotid lumen as the  denominator for stenosis measurement. The following velocity measurements were obtained: RIGHT ICA:  74/20 a cm/sec CCA:  [id number] cm/sec SYSTOLIC ICA/CCA RATIO:  0.6 DIASTOLIC ICA/CCA RATIO:  1.7 ECA:  72 cm/sec LEFT ICA:  59/15 cm/sec CCA:  [id number] cm/sec SYSTOLIC ICA/CCA RATIO:  0.5 DIASTOLIC ICA/CCA RATIO:  0.5 ECA:  79 cm/sec RIGHT CAROTID ARTERY: Mild intimal hyperplasia. No significant stenosis. RIGHT VERTEBRAL ARTERY:  Patent with antegrade flow. LEFT CAROTID ARTERY: Mild intimal hyperplasia. No significant stenosis. LEFT VERTEBRAL ARTERY:  Patent antegrade flow. IMPRESSION: 1. Mild bilateral carotid intimal hyperplasia. No significant stenosis. Degree of stenosis bilaterally is less than 50%. 2. Vertebral arteries are patent antegrade flow . Electronically Signed   By: Marcello Moores  Register   On: 06/07/2016 09:47     ASSESSMENT AND PLAN:   50 year old female with past medical history of hypertension who presented to the hospital due to weakness and also a syncopal episode.  1. Syncope-etiology unclear but likely vasovagal in nature. -CT head negative for acute pathology, cardiac markers 3 negative. No alarms on telemetry. Carotid duplex and echocardiogram were normal. -No further episodes.  2. Hypokalemia-will replace accordingly and repeat level in the morning.  3. Iron deficiency anemia-ferritin level is really low. Consistent with iron deficiency. -Continue iron supplements.  4. Generalized weakness - due to UTi/Hypokalemia  - await PT eval.   5. UTI - cont. IV ceftriaxone and follow cultures.    All the records are reviewed and case discussed with Care Management/Social Worker. Management plans discussed with the patient, family and they are in agreement.  CODE STATUS: full  DVT Prophylaxis: Lovenox  TOTAL TIME TAKING CARE OF THIS PATIENT: 30 minutes.   POSSIBLE D/C IN 1-2 DAYS, DEPENDING ON CLINICAL CONDITION.   Henreitta Leber M.D on 06/07/2016 at 2:24 PM  Between  7am to  6pm - Pager - [phone]  After 6pm go to [email or web address] - [employer] [facility]  Office  [phone]  CC: Primary care physician; No PCP Per Patient

## 2016-06-07 NOTE — Clinical Social Work Note (Signed)
MSW received consult for patient having difficulty paying for meds, case manager can assist with this, please consult case management.  MSW to sign off  Jones Broom. Darcey Demma, MSW [phone]  Mon-Fri 8a-4:30p 06/07/2016 8:52 AM

## 2016-06-08 ENCOUNTER — Inpatient Hospital Stay: Payer: Self-pay

## 2016-06-08 LAB — BASIC METABOLIC PANEL
ANION GAP: 5 (ref 5–15)
BUN: 19 mg/dL (ref 6–20)
CO2: 24 mmol/L (ref 22–32)
Calcium: 8.4 mg/dL — ABNORMAL LOW (ref 8.9–10.3)
Chloride: 109 mmol/L (ref 101–111)
Creatinine, Ser: 0.9 mg/dL (ref 0.44–1.00)
GFR calc Af Amer: >60 mL/min (ref >60)
GLUCOSE: 98 mg/dL (ref 65–99)
POTASSIUM: 3.7 mmol/L (ref 3.5–5.1)
Sodium: 138 mmol/L (ref 135–145)

## 2016-06-08 LAB — GLUCOSE, CAPILLARY: Glucose-Capillary: 98 mg/dL (ref 65–99)

## 2016-06-08 LAB — MAGNESIUM: MAGNESIUM: 2.1 mg/dL (ref 1.7–2.4)

## 2016-06-08 MED ORDER — LISINOPRIL 10 MG PO TABS: 10.0000 mg | ORAL_TABLET | Freq: Every day | ORAL | 2 refills | Status: DC

## 2016-06-08 MED ORDER — CEFUROXIME AXETIL 500 MG PO TABS: 500.0000 mg | ORAL_TABLET | Freq: Two times a day (BID) | ORAL | 0 refills | Status: DC

## 2016-06-08 MED ORDER — BISACODYL 10 MG RE SUPP
10.0000 mg | Freq: Once | RECTAL | Status: AC
  Administered 2016-06-08: 10 mg via RECTAL
  Filled 2016-06-08: qty 1

## 2016-06-08 MED ORDER — LACTULOSE 10 GM/15ML PO SOLN
30.0000 g | Freq: Once | ORAL | Status: AC
  Administered 2016-06-08: 30 g via ORAL
  Filled 2016-06-08: qty 60

## 2016-06-08 NOTE — Care Management (Signed)
Met with patient to discuss discharge planning. She is active with the [facility]. Referred with application to [facility]. Patient lives at home with her daughter. She has family that can assist her with transportation. Received walker from [facility]. Referral faxed to [facility].

## 2016-06-08 NOTE — Discharge Summary (Signed)
[place] [facility] [place] NAME: Cindy Burke    MR#:  [id number]  DATE OF BIRTH:  12/07/1965  DATE OF ADMISSION:  06/06/2016 ADMITTING PHYSICIAN: Epifanio Lesches, MD  DATE OF DISCHARGE: 06/08/2016  PRIMARY CARE PHYSICIAN: No PCP Per Patient    ADMISSION DIAGNOSIS:  Hypokalemia [E87.6] Syncope [R55] UTI (lower urinary tract infection) [N39.0] Fall [W19.XXXA] Altered mental status, unspecified altered mental status type [R41.82]  DISCHARGE DIAGNOSIS:  Active Problems:   Syncope   SECONDARY DIAGNOSIS:   Past Medical History:  Diagnosis Date  . Hypertension     HOSPITAL COURSE:   50 year old female with past medical history of hypertension who presented to the hospital due to weakness and also a syncopal episode.  1. Syncope-vasovagal in nature -CT head negative for acute pathology, cardiac markers 3 negative. No alarms on telemetry. Carotid duplex and echocardiogram were normal. -No further episodes while in the hospital.  2. Hypokalemia- replaced and resolved now.   3. Iron deficiency anemia - she will Continue iron supplements.  4. Generalized weakness - due to UTi/Hypokalemia  - seen by PT and recommend home health services.  Referred to [facility].    5. UTI - treated w/ IV Ceftriaxone in the hospital.  Urine cultures (-).   - d/c on Oral Ceftin for a few days.   DISCHARGE CONDITIONS:   Stable.   CONSULTS OBTAINED:    DRUG ALLERGIES:  No Known Allergies  DISCHARGE MEDICATIONS:     Medication List    TAKE these medications   cefUROXime 500 MG tablet Commonly known as:  CEFTIN Take 1 tablet (500 mg total) by mouth 2 (two) times daily with a meal.   lisinopril 10 MG tablet Commonly known as:  PRINIVIL,ZESTRIL Take 1 tablet (10 mg total) by mouth daily.         DISCHARGE INSTRUCTIONS:   DIET:  Regular diet  DISCHARGE CONDITION:  Stable  ACTIVITY:  Activity as tolerated  OXYGEN:   Home Oxygen: No.   Oxygen Delivery: room air  DISCHARGE LOCATION:  home   If you experience worsening of your admission symptoms, develop shortness of breath, life threatening emergency, suicidal or homicidal thoughts you must seek medical attention immediately by calling 911 or calling your MD immediately  if symptoms less severe.  You Must read complete instructions/literature along with all the possible adverse reactions/side effects for all the Medicines you take and that have been prescribed to you. Take any new Medicines after you have completely understood and accpet all the possible adverse reactions/side effects.   Please note  You were cared for by a hospitalist during your hospital stay. If you have any questions about your discharge medications or the care you received while you were in the hospital after you are discharged, you can call the unit and asked to speak with the hospitalist on call if the hospitalist that took care of you is not available. Once you are discharged, your primary care physician will handle any further medical issues. Please note that NO REFILLS for any discharge medications will be authorized once you are discharged, as it is imperative that you return to your primary care physician (or establish a relationship with a primary care physician if you do not have one) for your aftercare needs so that they can reassess your need for medications and monitor your lab values.     Today   Still complaining of vague abdominal pain. KUB showing some Constipation.  Given a suppository/Lactulose today.   VITAL SIGNS:  Blood pressure (!) 164/64, pulse (!) 59, temperature 97.7 F (36.5 C), temperature source Oral, resp. rate 18, height 5\' 6"  (1.676 m), weight 112.8 kg (248 lb 11.2 oz), last menstrual period 06/05/2016, SpO2 96 %.  I/O:   Intake/Output Summary (Last 24 hours) at 06/08/16 1447 Last data filed at 06/08/16 1404  Gross per 24 hour  Intake            2862.5 ml  Output             1800 ml  Net           1062.5 ml    PHYSICAL EXAMINATION:  GENERAL:  50 y.o.-year-old patient lying in the bed with no acute distress.  EYES: Pupils equal, round, reactive to light and accommodation. No scleral icterus. Extraocular muscles intact.  HEENT: Head atraumatic, normocephalic. Oropharynx and nasopharynx clear.  NECK:  Supple, no jugular venous distention. No thyroid enlargement, no tenderness.  LUNGS: Normal breath sounds bilaterally, no wheezing, rales,rhonchi. No use of accessory muscles of respiration.  CARDIOVASCULAR: S1, S2 normal. No murmurs, rubs, or gallops.  ABDOMEN: Soft, non-tender, non-distended. Bowel sounds present. No organomegaly or mass.  EXTREMITIES: No pedal edema, cyanosis, or clubbing.  NEUROLOGIC: Cranial nerves II through XII are intact. No focal motor or sensory defecits b/l.  PSYCHIATRIC: The patient is alert and oriented x 3. Good affect.  SKIN: No obvious rash, lesion, or ulcer.   DATA REVIEW:   CBC  Recent Labs Lab 06/07/16 0342  WBC 8.5  HGB 8.8*  HCT 28.9*  PLT 238    Chemistries   Recent Labs Lab 06/06/16 1301  06/08/16 0526  NA 139  < > 138  K 2.5*  < > 3.7  CL 104  < > 109  CO2 30  < > 24  GLUCOSE 124*  < > 98  BUN 22*  < > 19  CREATININE 0.88  < > 0.90  CALCIUM 9.0  < > 8.4*  MG 2.1  < > 2.1  AST 23  --   --   ALT 17  --   --   ALKPHOS 65  --   --   BILITOT 0.7  --   --   < > = values in this interval not displayed.  Cardiac Enzymes  Recent Labs Lab 06/06/16 1301  TROPONINI <0.03    Microbiology Results  Results for orders placed or performed during the hospital encounter of 06/06/16  Urine culture     Status: Abnormal   Collection Time: 06/06/16  2:00 PM  Result Value Ref Range Status   Specimen Description URINE, RANDOM  Final   Special Requests NONE  Final   Culture MULTIPLE SPECIES PRESENT, SUGGEST RECOLLECTION (A)  Final   Report Status 06/07/2016 FINAL  Final     RADIOLOGY:  Ct Abdomen Pelvis Wo Contrast  Result Date: 06/06/2016 CLINICAL DATA:  Low back pain and left-sided abdominal pain after a fall today. EXAM: CT ABDOMEN AND PELVIS WITHOUT CONTRAST TECHNIQUE: Multidetector CT imaging of the abdomen and pelvis was performed following the standard protocol without IV contrast. COMPARISON:  Pelvic ultrasound dated 02/06/2016 FINDINGS: Lower chest:  Small hiatal hernia.  Otherwise normal. Hepatobiliary: Normal. Pancreas: Normal. Spleen: Normal. Adrenals/Urinary Tract: Normal. Stomach/Bowel: Small hiatal hernia.  Otherwise normal. Vascular/Lymphatic: No pathologically enlarged lymph nodes. No evidence of abdominal aortic aneurysm. Reproductive: Uterus and left ovary are normal. 2.2 cm low-density area in the right  ovary, probably a cyst. Other: No free air or free fluid. Musculoskeletal:  No significant abnormalities. IMPRESSION: Benign-appearing abdomen and pelvis.  Small hiatal hernia. Electronically Signed   By: Lorriane Shire M.D.   On: 06/06/2016 15:14   Dg Lumbar Spine 2-3 Views  Result Date: 06/06/2016 CLINICAL DATA:  50 year old female status post falls from standing. Pain. Initial encounter. EXAM: LUMBAR SPINE - 2-3 VIEW COMPARISON:  CT Abdomen and Pelvis 1448 hours today. FINDINGS: Normal lumbar segmentation. Vertebral height and alignment within normal limits; trace retrolisthesis of L2 on L3. Mild degenerative endplate spurring. Relatively preserved disc spaces. Sacral ala and SI joints appear normal. Visible lower thoracic levels appear intact. IMPRESSION: No acute osseous abnormality identified in the lumbar spine. Electronically Signed   By: Genevie Ann M.D.   On: 06/06/2016 17:22   Dg Abd 1 View  Result Date: 06/08/2016 CLINICAL DATA:  Nausea and vomiting. EXAM: ABDOMEN - 1 VIEW COMPARISON:  CT abdomen pelvis - 06/06/2016 FINDINGS: Examination is degraded due to patient body habitus. Moderate colonic stool burden without evidence of enteric  obstruction. No pneumoperitoneum, pneumatosis or portal venous gas. No definitive abnormal intra-abdominal calcifications. Limited visualization of lower thorax is normal. No acute osseus abnormalities. IMPRESSION: Moderate colonic stool burden without evidence of enteric obstruction. Electronically Signed   By: Sandi Mariscal M.D.   On: 06/08/2016 11:59   Ct Head Wo Contrast  Result Date: 06/06/2016 CLINICAL DATA:  Syncope today. Possible head trauma. Confusion. Seizure disorder. EXAM: CT HEAD WITHOUT CONTRAST TECHNIQUE: Contiguous axial images were obtained from the base of the skull through the vertex without intravenous contrast. COMPARISON:  None. FINDINGS: Brain: There is no acute intracranial hemorrhage, acute infarction, or intracranial mass lesion. Brain parenchyma is normal. Vascular: Normal. Skull: Normal. Sinuses/Orbits: Normal. Other: No scalp contusion or hematoma. IMPRESSION: Normal exam. Electronically Signed   By: Lorriane Shire M.D.   On: 06/06/2016 15:08   US Carotid Bilateral  Result Date: 06/07/2016 CLINICAL DATA:  Hypertension. EXAM: BILATERAL CAROTID DUPLEX ULTRASOUND TECHNIQUE: Pearline Cables scale imaging, color Doppler and duplex ultrasound were performed of bilateral carotid and vertebral arteries in the neck. COMPARISON:  CT 06/06/2016. FINDINGS: Criteria: Quantification of carotid stenosis is based on velocity parameters that correlate the residual internal carotid diameter with NASCET-based stenosis levels, using the diameter of the distal internal carotid lumen as the denominator for stenosis measurement. The following velocity measurements were obtained: RIGHT ICA:  74/20 a cm/sec CCA:  123XX123 cm/sec SYSTOLIC ICA/CCA RATIO:  0.6 DIASTOLIC ICA/CCA RATIO:  1.7 ECA:  72 cm/sec LEFT ICA:  59/15 cm/sec CCA:  123456 cm/sec SYSTOLIC ICA/CCA RATIO:  0.5 DIASTOLIC ICA/CCA RATIO:  0.5 ECA:  79 cm/sec RIGHT CAROTID ARTERY: Mild intimal hyperplasia. No significant stenosis. RIGHT VERTEBRAL ARTERY:   Patent with antegrade flow. LEFT CAROTID ARTERY: Mild intimal hyperplasia. No significant stenosis. LEFT VERTEBRAL ARTERY:  Patent antegrade flow. IMPRESSION: 1. Mild bilateral carotid intimal hyperplasia. No significant stenosis. Degree of stenosis bilaterally is less than 50%. 2. Vertebral arteries are patent antegrade flow . Electronically Signed   By: Marcello Moores  Register   On: 06/07/2016 09:47      Management plans discussed with the patient, family and they are in agreement.  CODE STATUS:     Code Status Orders        Start     Ordered   06/06/16 1551  Full code  Continuous     06/06/16 1552    Code Status History    Date Active Date  Inactive Code Status Order ID Comments User Context   06/06/2016  3:52 PM 06/06/2016  8:01 PM Full Code [id number]  Epifanio Lesches, MD ED      TOTAL TIME TAKING CARE OF THIS PATIENT: 40 minutes.    Henreitta Leber M.D on 06/08/2016 at 2:47 PM  Between 7am to 6pm - Pager - [phone]  After 6pm go to [email or web address] - [employer] [place] [facility]  Office  [phone]  CC: Primary care physician; No PCP Per Patient

## 2016-06-08 NOTE — Care Management (Signed)
Attempted to see patient and she is on the phone. Ordered walker from [facility] .

## 2016-06-08 NOTE — Progress Notes (Signed)
Discharge instructions given to patient along with hard copy of prescriptions. Patient verbalized understanding. IV and tele removed. No distress at this time. Patient's family at bedside and will be transporting home.

## 2016-06-08 NOTE — Evaluation (Signed)
Physical Therapy Evaluation Patient Details Name: Cindy Burke MRN: [id number] DOB: 1966-09-16 Today's Date: 06/08/2016   History of Present Illness  50 yo female with onset of syncopal episode with fall at home and AMS was admitted, history of seizures and new UTI, CT of head was negative.  EF is 60-65%.  Clinical Impression  Pt is up to maneuver to her chair, able to stand up alone and needs assist of rolling device due to her nausea and LLE pain.  Will continue PT if pt is remaining in hospital over the weekend, but otherwise recommend HHPT or [facility] if this is not available.  Follow acutely for the same as pt's nausea permits.    Follow Up Recommendations Home health PT ([facility] not [facility] for [facility]PT)    Equipment Recommendations  Rolling walker with 5" wheels    Recommendations for Other Services Rehab consult     Precautions / Restrictions Precautions Precautions: Fall Precaution Comments: Pt requires a hand on her to walk in her room Restrictions Weight Bearing Restrictions: No      Mobility  Bed Mobility Overal bed mobility: Needs Assistance Bed Mobility: Supine to Sit     Supine to sit: Min guard (help under trunk to guide her to sit up bedside)     General bed mobility comments: uses bedrail and cued hand placement  Transfers Overall transfer level: Modified independent Equipment used: None             General transfer comment: pt used hand on side of next chair to assist standing and bedrail at bed  Ambulation/Gait Ambulation/Gait assistance: Min guard (for safety as pt states she might throw up, did not) Ambulation Distance (Feet): 10 Feet (x 2) Assistive device: 1 person hand held assist (IV pole for touching support) Gait Pattern/deviations: Step-through pattern;Shuffle;Wide base of support Gait velocity: reduced Gait velocity interpretation: Below normal speed for age/gender General Gait Details: pt maneuvers with holding onto  IV pole  Stairs Stairs:  (pt was nauseated and could not try them)          Wheelchair Mobility    Modified Rankin (Stroke Patients Only)       Balance Overall balance assessment: History of Falls                                           Pertinent Vitals/Pain Pain Assessment: Faces Pain Score: 4  Faces Pain Scale: Hurts little more Pain Location: L lower leg before walking Pain Descriptors / Indicators: Aching Pain Intervention(s): Monitored during session;Repositioned    Home Living Family/patient expects to be discharged to:: Private residence Living Arrangements: Children Available Help at Discharge: Family;Available 24 hours/day Type of Home: House Home Access: Level entry     Home Layout: Two level Home Equipment: None (per pt and PT does not have access to old chart information)      Prior Function Level of Independence: Independent               Hand Dominance        Extremity/Trunk Assessment   Upper Extremity Assessment: Overall WFL for tasks assessed           Lower Extremity Assessment: Overall WFL for tasks assessed      Cervical / Trunk Assessment: Normal  Communication      Cognition Arousal/Alertness: Awake/alert Behavior During  Therapy: Anxious Overall Cognitive Status: Within Functional Limits for tasks assessed                      General Comments General comments (skin integrity, edema, etc.): Pt is motivated to try but cannot get a history of previous admissions on the EPIC system    Exercises        Assessment/Plan    PT Assessment Patient needs continued PT services  PT Diagnosis Difficulty walking;Acute pain   PT Problem List Decreased activity tolerance;Decreased balance;Decreased mobility;Decreased knowledge of use of DME;Decreased safety awareness;Decreased knowledge of precautions;Obesity;Pain  PT Treatment Interventions DME instruction;Gait training;Stair  training;Functional mobility training;Therapeutic activities;Therapeutic exercise;Balance training;Neuromuscular re-education;Patient/family education   PT Goals (Current goals can be found in the Care Plan section) Acute Rehab PT Goals Patient Stated Goal: to get up to walk PT Goal Formulation: With patient Time For Goal Achievement: 06/15/16 Potential to Achieve Goals: Good    Frequency Min 2X/week   Barriers to discharge Inaccessible home environment stairs to upstairs    Co-evaluation               End of Session Equipment Utilized During Treatment: Gait belt Activity Tolerance: Patient tolerated treatment well;Patient limited by fatigue;Other (comment) (Pt complaining she might vomit but did not during session) Patient left: in chair;with call bell/phone within reach Nurse Communication: Mobility status         Time: [id number] PT Time Calculation (min) (ACUTE ONLY): 23 min   Charges:   PT Evaluation $PT Eval Low Complexity: 1 Procedure PT Treatments $Gait Training: 8-22 mins   PT G Codes:        Ramond Dial 06/15/2016, 11:20 AM    Mee Hives, PT MS Acute Rehab Dept. Number: [place] [facility] [place]

## 2016-08-21 ENCOUNTER — Ambulatory Visit: Payer: Self-pay

## 2016-08-28 ENCOUNTER — Ambulatory Visit: Payer: Self-pay | Admitting: Urology

## 2016-08-28 VITALS — BP 146/88 | HR 80 | Ht 66.0 in | Wt 243.0 lb

## 2016-08-28 DIAGNOSIS — Z23 Encounter for immunization: Secondary | ICD-10-CM

## 2016-08-28 DIAGNOSIS — I10 Essential (primary) hypertension: Secondary | ICD-10-CM

## 2016-08-28 MED ORDER — HYDROCHLOROTHIAZIDE 25 MG PO TABS: 25.0000 mg | ORAL_TABLET | Freq: Every day | ORAL | 4 refills | Status: DC

## 2016-08-28 MED ORDER — LISINOPRIL 10 MG PO TABS: 10.0000 mg | ORAL_TABLET | Freq: Every day | ORAL | 4 refills | Status: DC

## 2016-08-28 NOTE — Progress Notes (Signed)
  Patient: Cindy Burke Female    DOB: 06-11-1966   50 y.o.   MRN: [id number] Visit Date: 08/28/2016  Today's Provider: [place]   Chief Complaint  Patient presents with  . Follow-up    HTN med follow up, no more heavy bleeding just spotting   . Follow-up    Would like flu shot   Subjective:    HPI Patient is a 50 year old African American female who presents today for BP med refills, heavy menstrual bleeding and would like a flu shot.  Lisinopril 10 mg refilled.  BP is 146/88.  Patient states the heavy menstrual bleeding stopped when she started the HCTZ and increased her lisinopril.  She has not had any hot flashes.      No Known Allergies Previous Medications   CEFUROXIME (CEFTIN) 500 MG TABLET    Take 1 tablet (500 mg total) by mouth 2 (two) times daily with a meal.   LISINOPRIL (PRINIVIL,ZESTRIL) 10 MG TABLET    Take 1 tablet (10 mg total) by mouth daily.    Review of Systems  Constitutional: Negative for activity change and appetite change.  HENT: Negative for congestion and dental problem.   Eyes: Negative for discharge and itching.  Respiratory: Negative for apnea and chest tightness.   Cardiovascular: Negative for chest pain and leg swelling.  Gastrointestinal: Negative for abdominal distention and abdominal pain.  Endocrine: Negative for heat intolerance.  Genitourinary: Negative for difficulty urinating and dyspareunia.  Musculoskeletal: Negative for arthralgias and back pain.  Skin: Negative for color change and pallor.  Allergic/Immunologic: Negative for environmental allergies and food allergies.  Neurological: Negative for dizziness and facial asymmetry.  Hematological: Negative for adenopathy. Does not bruise/bleed easily.  Psychiatric/Behavioral: Negative for agitation and behavioral problems.    Social History  Substance Use Topics  . Smoking status: Current Some Day Smoker    Years: 10.00    Types: Cigarettes  . Smokeless tobacco:  Never Used     Comment: only 1 cigarette this month   . Alcohol use 0.6 oz/week    1 Cans of beer per week   Objective:   BP (!) 146/88   Pulse 80   Ht 5\' 6"  (1.676 m)   Wt 243 lb (110.2 kg)   LMP 08/02/2016 (Approximate)   BMI 39.22 kg/m   Physical Exam Constitutional: Well nourished. Alert and oriented, No acute distress. HEENT: [place] AT, moist mucus membranes. Trachea midline, no masses. Cardiovascular: No clubbing, cyanosis, or edema. Respiratory: Normal respiratory effort, no increased work of breathing. GI: Abdomen is soft, non tender, non distended, no abdominal masses. Liver and spleen not palpable.  No hernias appreciated.  Stool sample for occult testing is not indicated.   GU: No CVA tenderness.  No bladder fullness or masses.   Skin: No rashes, bruises or suspicious lesions. Lymph: No cervical or inguinal adenopathy. Neurologic: Grossly intact, no focal deficits, moving all 4 extremities. Psychiatric: Normal mood and affect.      Assessment & Plan:   1. HTN  - JNC 8 recommends in age groups < 59 years of age to have a DBP < 90 mm Hg-patient at goal  - Will need to check CMP, CBC, TSH, HbgA1c and Lipids- schedule labs   2. Flu shot  - flu shot given tonight in the right arm        [facility]

## 2016-09-04 ENCOUNTER — Other Ambulatory Visit: Payer: Self-pay

## 2016-09-19 ENCOUNTER — Telehealth: Payer: Self-pay

## 2016-09-19 NOTE — Telephone Encounter (Signed)
Called pt back and she stated [facility] on [address] says they do not have any RX for her on file. Pt will call them back because it show in the computer they were sent. PT will call back and let me know.

## 2017-02-26 ENCOUNTER — Emergency Department
Admission: EM | Admit: 2017-02-26 | Discharge: 2017-02-26 | Disposition: A | Discharge status: Home / Self Care | Payer: Self-pay | Attending: Emergency Medicine | Admitting: Emergency Medicine

## 2017-02-26 ENCOUNTER — Encounter: Payer: Self-pay | Admitting: Emergency Medicine

## 2017-02-26 ENCOUNTER — Emergency Department: Payer: Self-pay

## 2017-02-26 DIAGNOSIS — D259 Leiomyoma of uterus, unspecified: Secondary | ICD-10-CM | POA: Insufficient documentation

## 2017-02-26 DIAGNOSIS — F17210 Nicotine dependence, cigarettes, uncomplicated: Secondary | ICD-10-CM | POA: Insufficient documentation

## 2017-02-26 DIAGNOSIS — Z79899 Other long term (current) drug therapy: Secondary | ICD-10-CM | POA: Insufficient documentation

## 2017-02-26 DIAGNOSIS — I10 Essential (primary) hypertension: Secondary | ICD-10-CM | POA: Insufficient documentation

## 2017-02-26 DIAGNOSIS — N939 Abnormal uterine and vaginal bleeding, unspecified: Secondary | ICD-10-CM

## 2017-02-26 LAB — URINALYSIS, COMPLETE (UACMP) WITH MICROSCOPIC
Bacteria, UA: NONE SEEN
Bilirubin Urine: NEGATIVE
Glucose, UA: NEGATIVE mg/dL
Ketones, ur: NEGATIVE mg/dL
Leukocytes, UA: NEGATIVE
Nitrite: NEGATIVE
Protein, ur: 100 mg/dL — AB
Specific Gravity, Urine: 1.019 (ref 1.005–1.030)
pH: 5 (ref 5.0–8.0)

## 2017-02-26 LAB — HEMOGLOBIN AND HEMATOCRIT, BLOOD
HEMATOCRIT: 29.9 % — AB (ref 35.0–47.0)
HEMOGLOBIN: 9.6 g/dL — AB (ref 12.0–16.0)

## 2017-02-26 LAB — BASIC METABOLIC PANEL
ANION GAP: 8 (ref 5–15)
BUN: 20 mg/dL (ref 6–20)
CO2: 25 mmol/L (ref 22–32)
Calcium: 9 mg/dL (ref 8.9–10.3)
Chloride: 106 mmol/L (ref 101–111)
Creatinine, Ser: 1.01 mg/dL — ABNORMAL HIGH (ref 0.44–1.00)
Glucose, Bld: 103 mg/dL — ABNORMAL HIGH (ref 65–99)
POTASSIUM: 3.2 mmol/L — AB (ref 3.5–5.1)
Sodium: 139 mmol/L (ref 135–145)

## 2017-02-26 LAB — CBC WITH DIFFERENTIAL/PLATELET
Basophils Absolute: 0.1 10*3/uL (ref 0–0.1)
Basophils Relative: 1 %
EOS PCT: 10 %
Eosinophils Absolute: 0.8 10*3/uL — ABNORMAL HIGH (ref 0–0.7)
HEMATOCRIT: 30 % — AB (ref 35.0–47.0)
Hemoglobin: 9.7 g/dL — ABNORMAL LOW (ref 12.0–16.0)
LYMPHS PCT: 27 %
Lymphs Abs: 2.1 10*3/uL (ref 1.0–3.6)
MCH: 23.3 pg — ABNORMAL LOW (ref 26.0–34.0)
MCHC: 32.3 g/dL (ref 32.0–36.0)
MCV: 72.1 fL — ABNORMAL LOW (ref 80.0–100.0)
MONO ABS: 0.5 10*3/uL (ref 0.2–0.9)
MONOS PCT: 6 %
NEUTROS ABS: 4.5 10*3/uL (ref 1.4–6.5)
Neutrophils Relative %: 56 %
Platelets: 249 10*3/uL (ref 150–440)
RBC: 4.16 MIL/uL (ref 3.80–5.20)
RDW: 14.9 % — AB (ref 11.5–14.5)
WBC: 8 10*3/uL (ref 3.6–11.0)

## 2017-02-26 LAB — PREGNANCY, URINE: Preg Test, Ur: NEGATIVE

## 2017-02-26 MED ORDER — NAPROXEN 500 MG PO TABS: 500.0000 mg | ORAL_TABLET | Freq: Two times a day (BID) | ORAL | 0 refills | Status: AC

## 2017-02-26 NOTE — ED Triage Notes (Addendum)
Reports abnormal vaginal bleeding x 1 month.  Today passed a large clot.

## 2017-02-26 NOTE — Discharge Instructions (Signed)
Please seek medical attention for any high fevers, chest pain, shortness of breath, change in behavior, persistent vomiting, bloody stool or any other new or concerning symptoms.  

## 2017-02-26 NOTE — ED Notes (Signed)
Pt. returned from XR. 

## 2017-02-26 NOTE — ED Notes (Signed)
Patient transported to Ultrasound 

## 2017-02-26 NOTE — ED Provider Notes (Signed)
[facility] Emergency Department Provider Note   ____________________________________________   I have reviewed the triage vital signs and the nursing notes.   HISTORY  Chief Complaint Vaginal Bleeding   History limited by: Not Limited   HPI Cindy Burke is a 51 y.o. female who presents to the emergency department today because of concerns for vaginal bleeding. It has now been going on roughly 5 weeks. She states that she has used about 10 pads per day. It sounds like she recently started passing clots and that is what brought her to the emergency department. She states that she has been feeling weaker and with less energy recently. She states a similar thing happened a year ago when she was kept overnight. She however is unsure if they found out what the cause of the bleeding was. She does not have an OB/GYN doctor.   Past Medical History:  Diagnosis Date  . Hypertension     Patient Active Problem List   Diagnosis Date Noted  . Syncope 06/06/2016  . Essential hypertension 12/15/2015  . Heavy menstrual bleeding 12/15/2015    History reviewed. No pertinent surgical history.  Prior to Admission medications   Medication Sig Start Date End Date Taking? Authorizing Provider  cefUROXime (CEFTIN) 500 MG tablet Take 1 tablet (500 mg total) by mouth 2 (two) times daily with a meal. 06/08/16   Sainani, Belia Heman, MD  hydrochlorothiazide (HYDRODIURIL) 25 MG tablet Take 1 tablet (25 mg total) by mouth daily. 08/28/16   Zara Council A, PA-C  lisinopril (PRINIVIL,ZESTRIL) 10 MG tablet Take 1 tablet (10 mg total) by mouth daily. 08/28/16   Zara Council A, PA-C    Allergies Patient has no known allergies.  Family History  Problem Relation Age of Onset  . Hypertension Mother   . Cancer Mother   . Diabetes Mother   . Hypertension Father   . Heart attack Father   . Diabetes Father     Social History Social History  Substance Use Topics  . Smoking  status: Current Some Day Smoker    Years: 10.00    Types: Cigarettes  . Smokeless tobacco: Never Used     Comment: only 1 cigarette this month   . Alcohol use 0.6 oz/week    1 Cans of beer per week    Review of Systems Constitutional: No fever/chills Eyes: No visual changes. ENT: No sore throat. Cardiovascular: Denies chest pain. Respiratory: Denies shortness of breath. Gastrointestinal: Positive for lower abdominal pain.  Genitourinary: Positive for vaginal bleeding. Musculoskeletal: Negative for back pain. Skin: Negative for rash. Neurological: Negative for headaches, focal weakness or numbness.  ____________________________________________   PHYSICAL EXAM:  VITAL SIGNS: ED Triage Vitals [02/26/17 1122]  Enc Vitals Group     BP 132/84     Pulse Rate 88     Resp 16     Temp 97.8 F (36.6 C)     Temp Source Oral     SpO2 99 %     Weight 220 lb (99.8 kg)     Height 5\' 6"  (1.676 m)     Head Circumference      Peak Flow      Pain Score 0     Pain Loc      Pain Edu?      Excl. in [place]?      Constitutional: Alert and oriented. Well appearing and in no distress. Eyes: Conjunctivae are normal. Normal extraocular movements. ENT   Head: Normocephalic and  atraumatic.   Nose: No congestion/rhinnorhea.   Mouth/Throat: Mucous membranes are moist.   Neck: No stridor. Hematological/Lymphatic/Immunilogical: No cervical lymphadenopathy. Cardiovascular: Normal rate, regular rhythm.  No murmurs, rubs, or gallops.  Respiratory: Normal respiratory effort without tachypnea nor retractions. Breath sounds are clear and equal bilaterally. No wheezes/rales/rhonchi. Gastrointestinal: Soft and somewhat diffusely tender. No rebound. No guarding.  Genitourinary: Deferred Musculoskeletal: Normal range of motion in all extremities. No lower extremity edema. Neurologic:  Normal speech and language. No gross focal neurologic deficits are appreciated.  Skin:  Skin is warm, dry and  intact. No rash noted. Psychiatric: Mood and affect are normal. Speech and behavior are normal. Patient exhibits appropriate insight and judgment.  ____________________________________________    LABS (pertinent positives/negatives)  Labs Reviewed  CBC WITH DIFFERENTIAL/PLATELET - Abnormal; Notable for the following:       Result Value   Hemoglobin 9.7 (*)    HCT 30.0 (*)    MCV 72.1 (*)    MCH 23.3 (*)    RDW 14.9 (*)    Eosinophils Absolute 0.8 (*)    All other components within normal limits  BASIC METABOLIC PANEL - Abnormal; Notable for the following:    Potassium 3.2 (*)    Glucose, Bld 103 (*)    Creatinine, Ser 1.01 (*)    All other components within normal limits  URINALYSIS, COMPLETE (UACMP) WITH MICROSCOPIC - Abnormal; Notable for the following:    Color, Urine AMBER (*)    APPearance CLOUDY (*)    Hgb urine dipstick LARGE (*)    Protein, ur 100 (*)    Squamous Epithelial / LPF 6-30 (*)    All other components within normal limits  HEMOGLOBIN AND HEMATOCRIT, BLOOD - Abnormal; Notable for the following:    Hemoglobin 9.6 (*)    HCT 29.9 (*)    All other components within normal limits  PREGNANCY, URINE     ____________________________________________   EKG  None  ____________________________________________    RADIOLOGY  US  IMPRESSION: Uterus has a diffusely inhomogeneous echotexture, likely due to diffuse leiomyomatous change. Well-defined leiomyoma in the leftward aspect of the mid uterus measuring 1.6 x 1.6 x 1.5 cm. Endometrium measures 10 mm in thickness which is considered within normal limits for premenopausal patient ; patient reports premenopausal state. Dominant follicle left ovary. No other extrauterine pelvic mass. No free pelvic fluid.  ____________________________________________   PROCEDURES  Procedures  ____________________________________________   INITIAL IMPRESSION / ASSESSMENT AND PLAN / ED COURSE  Pertinent labs &  imaging results that were available during my care of the patient were reviewed by me and considered in my medical decision making (see chart for details).   ----------------------------------------- 1:55 PM on 02/26/2017 -----------------------------------------  Patient presented to the emergency department today because of concerns for vaginal bleeding. Patient's initial hemoglobin was low here in the emergency department however appears to be roughly patient's baseline. Will plan on checking ultrasound and we will repeat hemoglobin to assess for any further drop.  OBSERVATION CARE: This patient is being placed under observation care for the following reasons: Vaginal bleeding - recheck of hemoglobin to determine stability  ----------------------------------------- 3:41 PM on 02/26/2017 -----------------------------------------  Patient awaiting [place]. States that she did pass another clot here in the emergency department.   ----------------------------------------- 5:23 PM on 02/26/2017 -----------------------------------------   END OF OBSERVATION STATUS: After an appropriate period of observation, this patient is being discharged due to the following reason(s):  Ultrasound showing fibroids. However no other significant concerns and  findings. Hemoglobin remained stable. Discussed importance of follow up with patient. Offered birth control prescription to help manage bleeding but patient declined at this point. Will treat with NSAIDS.  ____________________________________________   FINAL CLINICAL IMPRESSION(S) / ED DIAGNOSES  Final diagnoses:  Vaginal bleeding  Uterine leiomyoma, unspecified location     Note: This dictation was prepared with Dragon dictation. Any transcriptional errors that result from this process are unintentional     Nance Pear, MD 02/26/17 1727

## 2017-10-18 ENCOUNTER — Other Ambulatory Visit: Payer: Self-pay | Admitting: Urology

## 2017-10-23 ENCOUNTER — Other Ambulatory Visit: Payer: Self-pay

## 2017-10-23 DIAGNOSIS — E1159 Type 2 diabetes mellitus with other circulatory complications: Secondary | ICD-10-CM

## 2017-10-23 DIAGNOSIS — I10 Essential (primary) hypertension: Principal | ICD-10-CM

## 2017-10-23 MED ORDER — LISINOPRIL 10 MG PO TABS: 10.0000 mg | ORAL_TABLET | Freq: Every day | ORAL | 1 refills | Status: DC

## 2017-10-23 MED ORDER — HYDROCHLOROTHIAZIDE 25 MG PO TABS: 25.0000 mg | ORAL_TABLET | Freq: Every day | ORAL | 1 refills | Status: DC

## 2018-04-25 ENCOUNTER — Other Ambulatory Visit: Payer: Self-pay | Admitting: Urology

## 2018-04-25 DIAGNOSIS — E1159 Type 2 diabetes mellitus with other circulatory complications: Secondary | ICD-10-CM

## 2018-04-25 DIAGNOSIS — I10 Essential (primary) hypertension: Principal | ICD-10-CM

## 2018-04-25 DIAGNOSIS — I152 Hypertension secondary to endocrine disorders: Secondary | ICD-10-CM

## 2018-04-28 ENCOUNTER — Emergency Department: Payer: Self-pay

## 2018-04-28 ENCOUNTER — Inpatient Hospital Stay: Payer: Self-pay

## 2018-04-28 ENCOUNTER — Inpatient Hospital Stay
Admission: EM | Admit: 2018-04-28 | Discharge: 2018-05-01 | DRG: 093 | Disposition: A | Discharge status: Home / Self Care | Payer: Self-pay | Attending: Internal Medicine | Admitting: Internal Medicine

## 2018-04-28 ENCOUNTER — Encounter: Payer: Self-pay | Admitting: Emergency Medicine

## 2018-04-28 ENCOUNTER — Other Ambulatory Visit: Payer: Self-pay

## 2018-04-28 DIAGNOSIS — R739 Hyperglycemia, unspecified: Secondary | ICD-10-CM | POA: Diagnosis present

## 2018-04-28 DIAGNOSIS — Z79899 Other long term (current) drug therapy: Secondary | ICD-10-CM

## 2018-04-28 DIAGNOSIS — R52 Pain, unspecified: Secondary | ICD-10-CM

## 2018-04-28 DIAGNOSIS — G9340 Encephalopathy, unspecified: Secondary | ICD-10-CM

## 2018-04-28 DIAGNOSIS — D509 Iron deficiency anemia, unspecified: Secondary | ICD-10-CM | POA: Diagnosis present

## 2018-04-28 DIAGNOSIS — E876 Hypokalemia: Secondary | ICD-10-CM

## 2018-04-28 DIAGNOSIS — G92 Toxic encephalopathy: Principal | ICD-10-CM | POA: Diagnosis present

## 2018-04-28 DIAGNOSIS — Z6838 Body mass index (BMI) 38.0-38.9, adult: Secondary | ICD-10-CM

## 2018-04-28 DIAGNOSIS — I10 Essential (primary) hypertension: Secondary | ICD-10-CM | POA: Diagnosis present

## 2018-04-28 DIAGNOSIS — M1711 Unilateral primary osteoarthritis, right knee: Secondary | ICD-10-CM | POA: Diagnosis present

## 2018-04-28 DIAGNOSIS — F17210 Nicotine dependence, cigarettes, uncomplicated: Secondary | ICD-10-CM | POA: Diagnosis present

## 2018-04-28 DIAGNOSIS — E860 Dehydration: Secondary | ICD-10-CM | POA: Diagnosis present

## 2018-04-28 LAB — COMPREHENSIVE METABOLIC PANEL
ALBUMIN: 3.9 g/dL (ref 3.5–5.0)
ALT: 18 U/L (ref 0–44)
AST: 19 U/L (ref 15–41)
Alkaline Phosphatase: 62 U/L (ref 38–126)
Anion gap: 13 (ref 5–15)
BILIRUBIN TOTAL: 0.4 mg/dL (ref 0.3–1.2)
BUN: 30 mg/dL — AB (ref 6–20)
CO2: 22 mmol/L (ref 22–32)
Calcium: 8.9 mg/dL (ref 8.9–10.3)
Chloride: 104 mmol/L (ref 98–111)
Creatinine, Ser: 1.06 mg/dL — ABNORMAL HIGH (ref 0.44–1.00)
GFR calc Af Amer: >60 mL/min (ref >60)
GFR calc non Af Amer: 59 mL/min — ABNORMAL LOW (ref >60)
GLUCOSE: 124 mg/dL — AB (ref 70–99)
POTASSIUM: 2.6 mmol/L — AB (ref 3.5–5.1)
SODIUM: 139 mmol/L (ref 135–145)
TOTAL PROTEIN: 7.3 g/dL (ref 6.5–8.1)

## 2018-04-28 LAB — PHOSPHORUS: PHOSPHORUS: 3.3 mg/dL (ref 2.5–4.6)

## 2018-04-28 LAB — URINALYSIS, COMPLETE (UACMP) WITH MICROSCOPIC
BACTERIA UA: NONE SEEN
BILIRUBIN URINE: NEGATIVE
GLUCOSE, UA: NEGATIVE mg/dL
KETONES UR: NEGATIVE mg/dL
NITRITE: NEGATIVE
PH: 5 (ref 5.0–8.0)
PROTEIN: NEGATIVE mg/dL
Specific Gravity, Urine: 1.014 (ref 1.005–1.030)

## 2018-04-28 LAB — CSF CELL COUNT WITH DIFFERENTIAL
EOS CSF: 0 %
Eosinophils, CSF: 4 %
LYMPHS CSF: 38 %
LYMPHS CSF: 43 %
MONOCYTE-MACROPHAGE-SPINAL FLUID: 15 %
Monocyte-Macrophage-Spinal Fluid: 57 %
OTHER CELLS CSF: 0
OTHER CELLS CSF: 0
RBC COUNT CSF: 4219 /mm3 — AB (ref 0–3)
RBC Count, CSF: 92 /mm3 — ABNORMAL HIGH (ref 0–3)
Segmented Neutrophils-CSF: 0 %
Segmented Neutrophils-CSF: 43 %
TUBE #: 1
TUBE #: 3
WBC, CSF: 2 /mm3 (ref 0–5)
WBC, CSF: 4 /mm3 (ref 0–5)

## 2018-04-28 LAB — BASIC METABOLIC PANEL
Anion gap: 9 (ref 5–15)
BUN: 28 mg/dL — AB (ref 6–20)
CALCIUM: 8.5 mg/dL — AB (ref 8.9–10.3)
CO2: 25 mmol/L (ref 22–32)
Chloride: 106 mmol/L (ref 98–111)
Creatinine, Ser: 0.88 mg/dL (ref 0.44–1.00)
GFR calc Af Amer: >60 mL/min (ref >60)
GFR calc non Af Amer: >60 mL/min (ref >60)
GLUCOSE: 127 mg/dL — AB (ref 70–99)
Potassium: 3.4 mmol/L — ABNORMAL LOW (ref 3.5–5.1)
Sodium: 140 mmol/L (ref 135–145)

## 2018-04-28 LAB — CBC WITH DIFFERENTIAL/PLATELET
Basophils Absolute: 0.1 10*3/uL (ref 0–0.1)
Basophils Relative: 1 %
EOS PCT: 6 %
Eosinophils Absolute: 0.4 10*3/uL (ref 0–0.7)
HEMATOCRIT: 35.3 % (ref 35.0–47.0)
HEMOGLOBIN: 11.3 g/dL — AB (ref 12.0–16.0)
LYMPHS ABS: 2.1 10*3/uL (ref 1.0–3.6)
LYMPHS PCT: 30 %
MCH: 22.3 pg — AB (ref 26.0–34.0)
MCHC: 31.9 g/dL — ABNORMAL LOW (ref 32.0–36.0)
MCV: 70 fL — AB (ref 80.0–100.0)
Monocytes Absolute: 0.5 10*3/uL (ref 0.2–0.9)
Monocytes Relative: 8 %
NEUTROS ABS: 4.1 10*3/uL (ref 1.4–6.5)
Neutrophils Relative %: 55 %
PLATELETS: 222 10*3/uL (ref 150–440)
RBC: 5.04 MIL/uL (ref 3.80–5.20)
RDW: 17 % — ABNORMAL HIGH (ref 11.5–14.5)
WBC: 7.3 10*3/uL (ref 3.6–11.0)

## 2018-04-28 LAB — URINE DRUG SCREEN, QUALITATIVE (ARMC ONLY)
Amphetamines, Ur Screen: NOT DETECTED
BENZODIAZEPINE, UR SCRN: NOT DETECTED
CANNABINOID 50 NG, UR ~~LOC~~: POSITIVE — AB
Cocaine Metabolite,Ur ~~LOC~~: NOT DETECTED
MDMA (Ecstasy)Ur Screen: NOT DETECTED
Methadone Scn, Ur: NOT DETECTED
Opiate, Ur Screen: NOT DETECTED
PHENCYCLIDINE (PCP) UR S: NOT DETECTED
Tricyclic, Ur Screen: NOT DETECTED

## 2018-04-28 LAB — IRON AND TIBC
Iron: 56 ug/dL (ref 28–170)
Saturation Ratios: 15 % (ref 10.4–31.8)
TIBC: 387 ug/dL (ref 250–450)
UIBC: 331 ug/dL

## 2018-04-28 LAB — AMMONIA: Ammonia: 14 umol/L (ref 9–35)

## 2018-04-28 LAB — T4, FREE: Free T4: 0.76 ng/dL — ABNORMAL LOW (ref 0.82–1.77)

## 2018-04-28 LAB — TROPONIN I: Troponin I: <0.03 ng/mL (ref <0.03)

## 2018-04-28 LAB — ETHANOL: Alcohol, Ethyl (B): 17 mg/dL — ABNORMAL HIGH (ref <10)

## 2018-04-28 LAB — ACETAMINOPHEN LEVEL: Acetaminophen (Tylenol), Serum: <10 ug/mL — ABNORMAL LOW (ref 10–30)

## 2018-04-28 LAB — MAGNESIUM: MAGNESIUM: 2.1 mg/dL (ref 1.7–2.4)

## 2018-04-28 LAB — FERRITIN: FERRITIN: 10 ng/mL — AB (ref 11–307)

## 2018-04-28 LAB — PROTIME-INR
INR: 1.07
PROTHROMBIN TIME: 13.8 s (ref 11.4–15.2)

## 2018-04-28 LAB — CRYPTOCOCCAL ANTIGEN, CSF: Crypto Ag: NEGATIVE

## 2018-04-28 LAB — TRANSFERRIN: Transferrin: 284 mg/dL (ref 192–382)

## 2018-04-28 LAB — PROTEIN AND GLUCOSE, CSF
Glucose, CSF: 70 mg/dL (ref 40–70)
TOTAL PROTEIN, CSF: 37 mg/dL (ref 15–45)

## 2018-04-28 LAB — RETICULOCYTES
RBC.: 4.9 MIL/uL (ref 3.80–5.20)
RETIC COUNT ABSOLUTE: 63.7 10*3/uL (ref 19.0–183.0)
RETIC CT PCT: 1.3 % (ref 0.4–3.1)

## 2018-04-28 LAB — TSH: TSH: 1.043 u[IU]/mL (ref 0.350–4.500)

## 2018-04-28 LAB — SALICYLATE LEVEL: Salicylate Lvl: <7 mg/dL (ref 2.8–30.0)

## 2018-04-28 LAB — HCG, QUANTITATIVE, PREGNANCY: hCG, Beta Chain, Quant, S: 2 m[IU]/mL (ref <5)

## 2018-04-28 MED ORDER — VANCOMYCIN HCL IN DEXTROSE 1-5 GM/200ML-% IV SOLN
1000.0000 mg | Freq: Two times a day (BID) | INTRAVENOUS | Status: DC
  Administered 2018-04-28 – 2018-04-29 (×2): 1000 mg via INTRAVENOUS
  Filled 2018-04-28 (×3): qty 200

## 2018-04-28 MED ORDER — SODIUM CHLORIDE 0.9 % IV SOLN
2.0000 g | Freq: Two times a day (BID) | INTRAVENOUS | Status: DC
  Administered 2018-04-28 – 2018-04-29 (×2): 2 g via INTRAVENOUS
  Filled 2018-04-28 (×2): qty 20
  Filled 2018-04-28: qty 2

## 2018-04-28 MED ORDER — LACTATED RINGERS IV SOLN
INTRAVENOUS | Status: DC
Start: 2018-04-28 — End: 2018-04-29
  Administered 2018-04-28 – 2018-04-29 (×3): via INTRAVENOUS
  Filled 2018-04-28 (×6): qty 1000

## 2018-04-28 MED ORDER — ACETAMINOPHEN 325 MG PO TABS
650.0000 mg | ORAL_TABLET | Freq: Four times a day (QID) | ORAL | Status: DC | PRN
  Administered 2018-04-28 – 2018-05-01 (×2): 650 mg via ORAL
  Filled 2018-04-28 (×3): qty 2

## 2018-04-28 MED ORDER — OXYCODONE-ACETAMINOPHEN 5-325 MG PO TABS
1.0000 | ORAL_TABLET | Freq: Four times a day (QID) | ORAL | Status: DC | PRN
  Administered 2018-04-28: 2 via ORAL
  Administered 2018-04-29 – 2018-04-30 (×3): 1 via ORAL
  Administered 2018-04-30 (×2): 2 via ORAL
  Filled 2018-04-28 (×3): qty 1
  Filled 2018-04-28 (×3): qty 2

## 2018-04-28 MED ORDER — GADOBENATE DIMEGLUMINE 529 MG/ML IV SOLN
20.0000 mL | Freq: Once | INTRAVENOUS | Status: AC | PRN
  Administered 2018-04-28: 20 mL via INTRAVENOUS

## 2018-04-28 MED ORDER — POTASSIUM CHLORIDE 10 MEQ/100ML IV SOLN
10.0000 meq | INTRAVENOUS | Status: AC
  Administered 2018-04-28 (×3): 10 meq via INTRAVENOUS
  Filled 2018-04-28 (×4): qty 100

## 2018-04-28 MED ORDER — MAGNESIUM SULFATE 2 GM/50ML IV SOLN
2.0000 g | Freq: Once | INTRAVENOUS | Status: AC
  Administered 2018-04-28: 2 g via INTRAVENOUS
  Filled 2018-04-28: qty 50

## 2018-04-28 MED ORDER — THIAMINE HCL 100 MG/ML IJ SOLN
100.0000 mg | Freq: Once | INTRAMUSCULAR | Status: AC
  Administered 2018-04-28: 100 mg via INTRAVENOUS
  Filled 2018-04-28: qty 2

## 2018-04-28 MED ORDER — ENOXAPARIN SODIUM 40 MG/0.4ML ~~LOC~~ SOLN
40.0000 mg | SUBCUTANEOUS | Status: DC
  Administered 2018-04-29 – 2018-05-01 (×3): 40 mg via SUBCUTANEOUS
  Filled 2018-04-28 (×3): qty 0.4

## 2018-04-28 MED ORDER — ENOXAPARIN SODIUM 40 MG/0.4ML ~~LOC~~ SOLN
40.0000 mg | Freq: Two times a day (BID) | SUBCUTANEOUS | Status: DC
  Administered 2018-04-28: 40 mg via SUBCUTANEOUS
  Filled 2018-04-28: qty 0.4

## 2018-04-28 MED ORDER — ONDANSETRON HCL 4 MG/2ML IJ SOLN
4.0000 mg | Freq: Four times a day (QID) | INTRAMUSCULAR | Status: DC | PRN
  Administered 2018-04-28 – 2018-04-30 (×2): 4 mg via INTRAVENOUS
  Filled 2018-04-28 (×2): qty 2

## 2018-04-28 MED ORDER — POTASSIUM CHLORIDE 2 MEQ/ML IV SOLN: INTRAVENOUS | Status: DC

## 2018-04-28 MED ORDER — ACETAMINOPHEN 650 MG RE SUPP: 650.0000 mg | Freq: Four times a day (QID) | RECTAL | Status: DC | PRN

## 2018-04-28 MED ORDER — BISACODYL 5 MG PO TBEC: 5.0000 mg | DELAYED_RELEASE_TABLET | Freq: Every day | ORAL | Status: DC | PRN

## 2018-04-28 MED ORDER — ACYCLOVIR SODIUM 50 MG/ML IV SOLN
10.0000 mg/kg | Freq: Once | INTRAVENOUS | Status: AC
  Administered 2018-04-28: 760 mg via INTRAVENOUS
  Filled 2018-04-28: qty 15.2

## 2018-04-28 MED ORDER — KETAMINE HCL 10 MG/ML IJ SOLN
100.0000 mg | Freq: Once | INTRAMUSCULAR | Status: AC
  Administered 2018-04-28: 100 mg via INTRAVENOUS
  Filled 2018-04-28: qty 1

## 2018-04-28 MED ORDER — SODIUM CHLORIDE 0.9 % IV SOLN
2.0000 g | Freq: Once | INTRAVENOUS | Status: AC
  Administered 2018-04-28: 2 g via INTRAVENOUS
  Filled 2018-04-28: qty 20

## 2018-04-28 MED ORDER — VANCOMYCIN HCL IN DEXTROSE 1-5 GM/200ML-% IV SOLN
1000.0000 mg | Freq: Once | INTRAVENOUS | Status: AC
  Administered 2018-04-28: 1000 mg via INTRAVENOUS
  Filled 2018-04-28: qty 200

## 2018-04-28 MED ORDER — FOLIC ACID 5 MG/ML IJ SOLN
1.0000 mg | Freq: Once | INTRAMUSCULAR | Status: AC
  Administered 2018-04-28: 1 mg via INTRAVENOUS
  Filled 2018-04-28 (×2): qty 0.2

## 2018-04-28 MED ORDER — DEXAMETHASONE SODIUM PHOSPHATE 10 MG/ML IJ SOLN
10.0000 mg | Freq: Once | INTRAMUSCULAR | Status: AC
  Administered 2018-04-28: 10 mg via INTRAVENOUS
  Filled 2018-04-28: qty 1

## 2018-04-28 MED ORDER — SENNOSIDES-DOCUSATE SODIUM 8.6-50 MG PO TABS: 1.0000 | ORAL_TABLET | Freq: Every evening | ORAL | Status: DC | PRN

## 2018-04-28 MED ORDER — IBUPROFEN 400 MG PO TABS
200.0000 mg | ORAL_TABLET | Freq: Four times a day (QID) | ORAL | Status: DC | PRN
  Administered 2018-04-28 – 2018-04-30 (×4): 200 mg via ORAL
  Filled 2018-04-28 (×4): qty 1

## 2018-04-28 MED ORDER — ONDANSETRON HCL 4 MG PO TABS: 4.0000 mg | ORAL_TABLET | Freq: Four times a day (QID) | ORAL | Status: DC | PRN

## 2018-04-28 MED ORDER — AMLODIPINE BESYLATE 5 MG PO TABS
5.0000 mg | ORAL_TABLET | Freq: Every day | ORAL | Status: DC
  Administered 2018-04-28 – 2018-04-29 (×2): 5 mg via ORAL
  Filled 2018-04-28 (×3): qty 1

## 2018-04-28 NOTE — Progress Notes (Signed)
Admitted this morning for altered mental status.  Patient initial CT head unremarkable, patient had LP done in the emergency room patient opening pressure of LP was 33 but otherwise CSF is unremarkable.  Patient still confused, she is oriented to place and person but she could not tell why she is here and also unable to remember the names of her son and daughter-in-law.  Complains of left knee pain.  Last seen normal was day before night. 1.  Altered mental status secondary to possible infection/drug abuse: MRI of the brain showed scattered foci of abnormal T2 and flair signal within hemisphere of white matter.  Appreciate neurology consult.  Patient still confused so continue IV fluids, patient receiving IV thiamine, folic acid, replace potassium and IV fluids,, patient will get potassium and IV fluids, potassium improved from 2.6-3.4.    2.  Unlikely meningitis patient received Rocephin, vancomycin, acyclovir in the emergency room.  Patient still on IV Rocephin, in.  Vancomycin.  Decadron.  #3 .severe hypokalemia; continue to replace potassium and IV fluids. #4/ left knee pain: X-ray of the left knee.  #5/ polysubstance abuse, toxicology positive for cannabinoids   Time spent is 25 minutes,  discussed with patient's son and daughter-in-law. disCussed with Dr. Doy Mince

## 2018-04-28 NOTE — Progress Notes (Signed)
Anticoagulation monitoring(Lovenox):  52 yo  female ordered Lovenox 40 mg Q12h  Filed Weights   04/28/18 0219  Weight: 230 lb (104.3 kg)   Body mass index is 38.27 kg/m.   Lab Results  Component Value Date   CREATININE 0.88 04/28/2018   CREATININE 1.06 (H) 04/28/2018   CREATININE 1.01 (H) 02/26/2017   Estimated Creatinine Clearance: 88.6 mL/min (by C-G formula based on SCr of 0.88 mg/dL). Hemoglobin & Hematocrit     Component Value Date/Time   HGB 11.3 (L) 04/28/2018 0223   HGB 9.5 (L) 12/22/2015 1815   HCT 35.3 04/28/2018 0223   HCT 32.0 (L) 12/22/2015 1815     Per Protocol for Patient with estCrcl > 30 ml/min and BMI < 40, will transition back to Lovenox 40 mg Q24h.

## 2018-04-28 NOTE — Progress Notes (Signed)
Pt complains of pain 9/10 not relieved by prn tylenol. MD orders Ibuprofen. I will continue to assess.

## 2018-04-28 NOTE — Progress Notes (Signed)
Pt complains of 10/10 left knee pain not relieved with prn tylenol or advil. MD orders percocet and xray of knee. I will continue to assess.

## 2018-04-28 NOTE — H&P (Signed)
[facility] [place] NAME: Cindy Burke    MR#:  [id number]  DATE OF BIRTH:  1966/05/07  DATE OF ADMISSION:  04/28/2018  PRIMARY CARE PHYSICIAN: Patient, No Pcp Per   REQUESTING/REFERRING PHYSICIAN: Darel Hong, MD  CHIEF COMPLAINT:   Chief Complaint  Patient presents with  . Altered Mental Status    HISTORY OF PRESENT ILLNESS:  Cindy Burke  is a 52 y.o. female with a known history of obesity, HTN who p/w 1d Hx acute AMS/encephalopathy. Pt is AAOx0, and cannot provide any Hx or ROS. Per family/records, pt was reportedly in her usual state of health when she went to bed on 04/27/2018 PM. She woke up from sleep @~0115AM on 07/08, vomited, and then became confused/disoriented. It is reported that pt greeted ED staff politely on arrival, but was completely disoriented. At the time of my assessment, she is anxious, afraid and tearful and agitated, but cannot provide a clear reason. She is alert and cooperative. She is not angry or combative. She is very afraid of being left alone. She cannot tell me her name, her home address, her current location or date. Her reasoning/logic also seems to be impaired. She is afebrile and does not appear septic/toxic.  CT head (-) acute intracranial abnl. LP was performed in ED, opening pressure 33, CSF appeared grossly clear.  PAST MEDICAL HISTORY:   Past Medical History:  Diagnosis Date  . Hypertension     PAST SURGICAL HISTORY:  History reviewed. No pertinent surgical history.  SOCIAL HISTORY:   Social History   Tobacco Use  . Smoking status: Current Some Day Smoker    Years: 10.00    Types: Cigarettes  . Smokeless tobacco: Never Used  . Tobacco comment: only 1 cigarette this month   Substance Use Topics  . Alcohol use: Yes    Alcohol/week: 0.6 oz    Types: 1 Cans of beer per week    FAMILY HISTORY:   Family History  Problem Relation Age of Onset  . Hypertension Mother   . Cancer  Mother   . Diabetes Mother   . Hypertension Father   . Heart attack Father   . Diabetes Father     DRUG ALLERGIES:  No Known Allergies  REVIEW OF SYSTEMS:   Review of Systems  Unable to perform ROS: Mental status change  Gastrointestinal: Positive for vomiting.   MEDICATIONS AT HOME:   Prior to Admission medications   Medication Sig Start Date End Date Taking? Authorizing Provider  cefUROXime (CEFTIN) 500 MG tablet Take 1 tablet (500 mg total) by mouth 2 (two) times daily with a meal. 06/08/16   Sainani, Belia Heman, MD  hydrochlorothiazide (HYDRODIURIL) 25 MG tablet Take 1 tablet (25 mg total) by mouth daily. 10/23/17   Zara Council A, PA-C  lisinopril (PRINIVIL,ZESTRIL) 10 MG tablet Take 1 tablet (10 mg total) by mouth daily. 10/23/17   McGowan, Larene Beach A, PA-C      VITAL SIGNS:  Blood pressure (!) 171/100, pulse 86, temperature 98.1 F (36.7 C), temperature source Oral, resp. rate 16, height 5\' 5"  (1.651 m), weight 104.3 kg (230 lb), SpO2 100 %.  PHYSICAL EXAMINATION:  Physical Exam  Constitutional: She appears well-developed and well-nourished. She is active and cooperative.  Non-toxic appearance. She does not have a sickly appearance. She does not appear ill. She appears distressed (anxious/agitated). She is not intubated.  HENT:  Head: Atraumatic.  Mouth/Throat: Oropharynx is clear and moist. No  oropharyngeal exudate.  Eyes: Conjunctivae, EOM and lids are normal. No scleral icterus.  Neck: Neck supple. No JVD present. No thyromegaly present.  Cardiovascular: Normal rate, regular rhythm, S1 normal, S2 normal and normal heart sounds.  No extrasystoles are present. Exam reveals no gallop, no S3, no S4, no distant heart sounds and no friction rub.  No murmur heard. Pulmonary/Chest: Effort normal and breath sounds normal. No accessory muscle usage or stridor. No apnea, no tachypnea and no bradypnea. She is not intubated. No respiratory distress. She has no decreased breath sounds.  She has no wheezes. She has no rhonchi. She has no rales.  Abdominal: Soft. Bowel sounds are normal. She exhibits no distension. There is no tenderness. There is no rebound and no guarding.  Musculoskeletal: Normal range of motion. She exhibits no edema or tenderness.  Lymphadenopathy:    She has no cervical adenopathy.  Neurological: She is alert. She is disoriented.  Skin: Skin is warm, dry and intact. No rash noted. She is not diaphoretic. No erythema.  Psychiatric: Her mood appears anxious. Her affect is labile. Her affect is not angry. Her speech is delayed. Her speech is not rapid and/or pressured and not slurred. She is agitated and withdrawn. She is not aggressive, not hyperactive, not actively hallucinating and not combative. Cognition and memory are impaired. She is communicative. She exhibits abnormal recent memory and abnormal remote memory. She is attentive.   Disoriented. Emotionally labile. Does not appear to exhibit obvious gross focal neurological deficits in motor strength or cranial nerve function. LABORATORY PANEL:   CBC Recent Labs  Lab 04/28/18 0223  WBC 7.3  HGB 11.3*  HCT 35.3  PLT 222   ------------------------------------------------------------------------------------------------------------------  Chemistries  Recent Labs  Lab 04/28/18 0223  NA 139  K 2.6*  CL 104  CO2 22  GLUCOSE 124*  BUN 30*  CREATININE 1.06*  CALCIUM 8.9  AST 19  ALT 18  ALKPHOS 62  BILITOT 0.4   ------------------------------------------------------------------------------------------------------------------  Cardiac Enzymes Recent Labs  Lab 04/28/18 0223  TROPONINI <0.03   ------------------------------------------------------------------------------------------------------------------  RADIOLOGY:  Ct Head Wo Contrast  Result Date: 04/28/2018 CLINICAL DATA:  Unexplained altered level of consciousness EXAM: CT HEAD WITHOUT CONTRAST TECHNIQUE: Contiguous axial images  were obtained from the base of the skull through the vertex without intravenous contrast. COMPARISON:  06/06/2016 FINDINGS: Brain: No evidence of infarction, hemorrhage, hydrocephalus, extra-axial collection or mass lesion/mass effect. Vascular: No hyperdense vessel or unexpected calcification. Skull: Normal. Negative for fracture or focal lesion. Sinuses/Orbits: Negative IMPRESSION: Negative head CT Electronically Signed   By: Monte Fantasia M.D.   On: 04/28/2018 02:38   Dg Chest Port 1 View  Result Date: 04/28/2018 CLINICAL DATA:  Altered mental status.  Shortness of breath. EXAM: PORTABLE CHEST 1 VIEW COMPARISON:  Radiographs 01/19/2015 FINDINGS: The cardiomediastinal contours are normal. Prior right upper lobe opacity has resolved. Pulmonary vasculature is normal. No consolidation, pleural effusion, or pneumothorax. No acute osseous abnormalities are seen. IMPRESSION: No acute pulmonary process. Electronically Signed   By: Jeb Levering M.D.   On: 04/28/2018 02:52   IMPRESSION AND PLAN:   A/P: 53F acute AMS/encephalopathy of unclear etiology. (+) UTI, hypokalemia, hyperglycemia, azotemia, microcytic anemia, elevated BP (w/ Dx HTN). -Pt reportedly vomited x1, then became confused. Lungs clear, CXR (-), does not seem to have aspirated -[place]cutely encephalopathy, AAOx0, emotional; etiology unclear, multiple potential factors -LP opening pressure 33, CSF appeared grossly clear -CT head (-) acute intracranial abnl -Afebrile, (-) leukocytosis, SIRS (-); though suspicion  is low, pt covered for acute bacterial meningitis w/ Ceftriaxone, Vancomycin, Acyclovir in ED -Droplet precautions -CSF for gram, Cx, HSV, crypto, VRDL, Lyme, NMDA receptor Ab -RPR, ammonia, HIV pending -TSH 1.043, FT4 0.76 (slightly low) -EtOH 17, ordered for thiamine/folate -Salicylate (-) -MRI brain pending -U/A (+), Ceftriaxone -K+ 2.6, replete and monitor -Mag level pending -Hyperglycemia mild -BUN 30, Cr 1.06, suspect  prerenal azotemia, IVF -Iron studies pending -Trial Amlodipine -Hold home meds -Cardiac diet as tolerated -Lovenox -Full code -Admission, > 2 midnights   All the records are reviewed and case discussed with ED provider. Management plans discussed with the patient, family and they are in agreement.  CODE STATUS: Full code  TOTAL TIME TAKING CARE OF THIS PATIENT: 90 minutes.    Arta Silence M.D on 04/28/2018 at 3:54 AM  Between 7am to 6pm - Pager - [phone]  After 6pm go to [email or web address] - Proofreader  [facility]  Office  [phone]  CC: Primary care physician; Patient, No Pcp Per   Note: This dictation was prepared with Dragon dictation along with smaller phrase technology. Any transcriptional errors that result from this process are unintentional.

## 2018-04-28 NOTE — ED Triage Notes (Signed)
Pt arrives via [facility] from home with c/o AMS. Per family, pt woke up from sleep about 0115, vomited, and then became disoriented. Pt is still unable to answer questions at this time. Per EMS, pt's BP 144/82, 99% RA, HR 80, and CBG 112.

## 2018-04-28 NOTE — Care Management (Signed)
RNCM attempted to meet with patient this AM but she was off the unit; patients family was at bedside.  Application to Medication Management and [facility] left at bedside.c

## 2018-04-28 NOTE — Progress Notes (Signed)
Patient admitted for AMS and hypertensive. Patient ordered lovenox 40 mg subq daily for VTE prophylaxis Pt wt. 104.3 kg CrCl 73.5 ml/min  Considering patient is bordering a BMI of > 40 (actual BMI is 38.3) will increase lovenox to 40 mg subq bid  Tobie Lords, PharmD, BCPS Clinical Pharmacist 04/28/2018

## 2018-04-28 NOTE — ED Provider Notes (Signed)
[facility] Emergency Department Provider Note  ____________________________________________   First MD Initiated Contact with Patient 04/28/18 [phone]     (approximate)  I have reviewed the triage vital signs and the nursing notes.   HISTORY  Chief Complaint Altered Mental Status  Level 5 exemption history limited by the patient's clinical condition  HPI Cindy Burke is a 52 y.o. female is brought to the emergency department by EMS with altered mental status.  According to EMS the patient went to sleep this evening and was normal however when she awoke she did not know her name, where she was, and has been extremely confused in route.  She is had normal vital signs and a normal blood sugar.  The patient herself seems quite frightened and does not know what is happening.  Further history is limited as the patient is profoundly confused.    Past Medical History:  Diagnosis Date  . Hypertension     Patient Active Problem List   Diagnosis Date Noted  . Encephalopathy acute 04/28/2018  . Syncope 06/06/2016  . Essential hypertension 12/15/2015  . Heavy menstrual bleeding 12/15/2015    History reviewed. No pertinent surgical history.  Prior to Admission medications   Medication Sig Start Date End Date Taking? Authorizing Provider  cefUROXime (CEFTIN) 500 MG tablet Take 1 tablet (500 mg total) by mouth 2 (two) times daily with a meal. 06/08/16   Sainani, Belia Heman, MD  hydrochlorothiazide (HYDRODIURIL) 25 MG tablet Take 1 tablet (25 mg total) by mouth daily. 10/23/17   Zara Council A, PA-C  lisinopril (PRINIVIL,ZESTRIL) 10 MG tablet Take 1 tablet (10 mg total) by mouth daily. 10/23/17   Zara Council A, PA-C    Allergies Patient has no known allergies.  Family History  Problem Relation Age of Onset  . Hypertension Mother   . Cancer Mother   . Diabetes Mother   . Hypertension Father   . Heart attack Father   . Diabetes Father     Social  History Social History   Tobacco Use  . Smoking status: Current Some Day Smoker    Years: 10.00    Types: Cigarettes  . Smokeless tobacco: Never Used  . Tobacco comment: only 1 cigarette this month   Substance Use Topics  . Alcohol use: Yes    Alcohol/week: 0.6 oz    Types: 1 Cans of beer per week  . Drug use: Yes    Types: Other-see comments    Comment: Marijuana    Review of Systems Level 5 exemption history limited by the patient's altered mental status ____________________________________________   PHYSICAL EXAM:  VITAL SIGNS: ED Triage Vitals  Enc Vitals Group     BP      Pulse      Resp      Temp      Temp src      SpO2      Weight      Height      Head Circumference      Peak Flow      Pain Score      Pain Loc      Pain Edu?      Excl. in [place]?     Constitutional: Alert and oriented x0.  Does not know her name, where she is, why she is here, or the year.  She does not know her birthdate.  She appears frightened and tearful Eyes: PERRL EOMI. midrange and brisk Head: Atraumatic. Nose: No  congestion/rhinnorhea. Mouth/Throat: No trismus Neck: No stridor.  No meningismus Cardiovascular: Normal rate, regular rhythm. Grossly normal heart sounds.  Good peripheral circulation. Respiratory: Normal respiratory effort.  No retractions. Lungs CTAB and moving good air Gastrointestinal: Morbidly obese soft nontender Musculoskeletal: No lower extremity edema   Neurologic: 5 out of 5 all 4 normal deep tendon reflexes Skin:  Skin is warm, dry and intact. No rash noted. Psychiatric: Profoundly confused    ____________________________________________   DIFFERENTIAL includes but not limited to  Meningitis, encephalitis, NMDA encephalitis, drug intoxication, alcohol intoxication, withdrawal syndrome, sepsis ____________________________________________   LABS (all labs ordered are listed, but only abnormal results are displayed)  Labs Reviewed  COMPREHENSIVE  METABOLIC PANEL - Abnormal; Notable for the following components:      Result Value   Potassium 2.6 (*)    Glucose, Bld 124 (*)    BUN 30 (*)    Creatinine, Ser 1.06 (*)    GFR calc non Af Amer 59 (*)    All other components within normal limits  ACETAMINOPHEN LEVEL - Abnormal; Notable for the following components:   Acetaminophen (Tylenol), Serum <10 (*)    All other components within normal limits  ETHANOL - Abnormal; Notable for the following components:   Alcohol, Ethyl (B) 17 (*)    All other components within normal limits  CBC WITH DIFFERENTIAL/PLATELET - Abnormal; Notable for the following components:   Hemoglobin 11.3 (*)    MCV 70.0 (*)    MCH 22.3 (*)    MCHC 31.9 (*)    RDW 17.0 (*)    All other components within normal limits  URINALYSIS, COMPLETE (UACMP) WITH MICROSCOPIC - Abnormal; Notable for the following components:   Color, Urine YELLOW (*)    APPearance HAZY (*)    Hgb urine dipstick SMALL (*)    Leukocytes, UA MODERATE (*)    All other components within normal limits  URINE DRUG SCREEN, QUALITATIVE ([facility] ONLY) - Abnormal; Notable for the following components:   Cannabinoid 50 Ng, Ur Hana POSITIVE (*)    Barbiturates, Ur Screen   (*)    Value: Result not available. Reagent lot number recalled by manufacturer.   All other components within normal limits  T4, FREE - Abnormal; Notable for the following components:   Free T4 0.76 (*)    All other components within normal limits  CSF CELL COUNT WITH DIFFERENTIAL - Abnormal; Notable for the following components:   RBC Count, CSF 92 (*)    All other components within normal limits  CSF CELL COUNT WITH DIFFERENTIAL - Abnormal; Notable for the following components:   RBC Count, CSF 4,219 (*)    All other components within normal limits  FERRITIN - Abnormal; Notable for the following components:   Ferritin 10 (*)    All other components within normal limits  CSF CULTURE  SALICYLATE LEVEL  TROPONIN I  HCG,  QUANTITATIVE, PREGNANCY  TSH  PROTIME-INR  PROTEIN AND GLUCOSE, CSF  CRYPTOCOCCAL ANTIGEN, CSF  AMMONIA  MAGNESIUM  IRON AND TIBC  RETICULOCYTES  PHOSPHORUS  HIV ANTIBODY (ROUTINE TESTING)  HERPES SIMPLEX VIRUS(HSV) DNA BY PCR  VDRL, CSF  N-METHYL-D-ASPARTATE RECPT.IGG  B. BURGDORFI ANTIBODIES, CSF  RPR  TRANSFERRIN    Lab work reviewed by me with a number of abnormalities most notably low potassium.  CSF is largely unremarkable and the elevated red sore likely secondary to traumatic tap __________________________________________  EKG    ____________________________________________  RADIOLOGY  Head CT reviewed by me with  no acute disease  Chest x-ray reviewed by me with no acute disease ____________________________________________   PROCEDURES  Procedure(s) performed: Yes  .Critical Care Performed by: Darel Hong, MD Authorized by: Darel Hong, MD   Critical care provider statement:    Critical care time (minutes):  30   Critical care time was exclusive of:  Separately billable procedures and treating other patients   Critical care was necessary to treat or prevent imminent or life-threatening deterioration of the following conditions:  CNS failure or compromise   Critical care was time spent personally by me on the following activities:  Development of treatment plan with patient or surrogate, discussions with consultants, evaluation of patient's response to treatment, examination of patient, obtaining history from patient or surrogate, ordering and performing treatments and interventions, ordering and review of laboratory studies, ordering and review of radiographic studies, pulse oximetry, re-evaluation of patient's condition and review of old charts .Lumbar Puncture Date/Time: 04/28/2018 4:14 AM Performed by: Darel Hong, MD Authorized by: Darel Hong, MD   Consent:    Consent obtained:  Verbal   Consent given by:  Patient   Risks discussed:   Headache, bleeding, infection, pain, nerve damage and repeat procedure   Alternatives discussed:  No treatment and delayed treatment Pre-procedure details:    Procedure purpose:  Diagnostic   Preparation: Patient was prepped and draped in usual sterile fashion   Sedation:    Sedation type:  Deep Anesthesia (see MAR for exact dosages):    Anesthesia method:  Local infiltration   Local anesthetic:  Lidocaine 1% w/o epi Procedure details:    Lumbar space:  L3-L4 interspace   Patient position:  L lateral decubitus   Needle gauge:  18   Needle type:  Spinal needle - Quincke tip   Needle length (in):  3.5   Ultrasound guidance: no     Number of attempts:  5 or more   Opening pressure (cm H2O):  33   Fluid appearance:  Clear   Tubes of fluid:  4   Total volume (ml):  4 Post-procedure:    Puncture site:  Adhesive bandage applied and direct pressure applied   Patient tolerance of procedure:  Tolerated well, no immediate complications Comments:     The patient is morbidly obese and it was very difficult to palpate her landmarks.  She was placed in left lateral decubitus and required ketamine sedation for the procedure.  I attempted 4 times without success however repositioned and on my fifth attempt did get CSF on my first check on the new clean site opening pressure was 33. .Sedation Date/Time: 04/28/2018 4:15 AM Performed by: Darel Hong, MD Authorized by: Darel Hong, MD   Consent:    Consent obtained:  Verbal (electronic informed consent)   Consent given by:  Patient   Risks discussed:  Allergic reaction, dysrhythmia, inadequate sedation, nausea, vomiting, respiratory compromise necessitating ventilatory assistance and intubation, prolonged sedation necessitating reversal and prolonged hypoxia resulting in organ damage Universal protocol:    Procedure explained and questions answered to patient or proxy's satisfaction: yes     Relevant documents present and verified: yes      Test results available and properly labeled: yes     Imaging studies available: yes     Required blood products, implants, devices, and special equipment available: yes     Immediately prior to procedure a time out was called: yes     Patient identity confirmation method:  Arm band Indications:    Procedure  performed:  Lumbar puncture   Procedure necessitating sedation performed by:  Physician performing sedation   Intended level of sedation:  Deep Pre-sedation assessment:    Time since last food or drink:  Unknown   NPO status caution: unable to specify NPO status and urgency dictates proceeding with non-ideal NPO status     ASA classification: class 2 - patient with mild systemic disease     Neck mobility: normal     Mouth opening:  2 finger widths   Thyromental distance:  3 finger widths   Mallampati score:  II - soft palate, uvula, fauces visible   Pre-sedation assessments completed and reviewed: airway patency, cardiovascular function, hydration status and respiratory function     Pre-sedation assessments completed and reviewed: mental status not reviewed, nausea/vomiting not reviewed and pain level not reviewed     Pre-sedation assessment completed:  04/28/2018 2:16 AM Immediate pre-procedure details:    Reassessment: Patient reassessed immediately prior to procedure     Reviewed: vital signs, relevant labs/tests and NPO status     Verified: bag valve mask available, emergency equipment available, intubation equipment available, IV patency confirmed, oxygen available, reversal medications available and suction available   Procedure details (see MAR for exact dosages):    Sedation:  Ketamine   Analgesia:  None   Intra-procedure monitoring:  Blood pressure monitoring, continuous pulse oximetry, cardiac monitor, frequent vital sign checks and frequent LOC assessments   Intra-procedure events: none     Total Provider sedation time (minutes):  20 Post-procedure details:    Post-sedation  assessment completed:  04/28/2018 4:16 AM   Attendance: Constant attendance by certified staff until patient recovered     Recovery: Patient returned to pre-procedure baseline     Post-sedation assessments completed and reviewed: airway patency, cardiovascular function, hydration status, mental status and respiratory function     Patient is stable for discharge or admission: yes     Patient tolerance:  Tolerated well, no immediate complications    Critical Care performed: Yes  ____________________________________________   INITIAL IMPRESSION / ASSESSMENT AND PLAN / ED COURSE  Pertinent labs & imaging results that were available during my care of the patient were reviewed by me and considered in my medical decision making (see chart for details).   The patient arrives hemodynamically stable but profoundly confused and encephalopathic alert and oriented x0.  Differential is extremely broad and even though the patient is afebrile I do think she requires a lumbar puncture.  We discussed CT scan and blood work and she consents.  Following her CT and lab work we discussed lumbar puncture and she was confused and frightened.  I offered her IV sedation which she consented to and was given 100 mg of ketamine IV to facilitate procedural sedation.  Lumbar puncture was technically difficult to perform secondary to her morbid obesity and I was unable to get CSF for my first 4 attempts however on the fifth attempt with a clean needle and a new site I got the LP with one stick.  Elevated pressure of 33 raises concern for meningitis even though the CSF appeared clear.  At this point I will cover her with ceftriaxone dexamethasone acyclovir and she will require inpatient admission.  I discussed with the hospitalist who is graciously agreed to admit the patient to his service.      ____________________________________________   FINAL CLINICAL IMPRESSION(S) / ED DIAGNOSES  Final diagnoses:  Encephalopathy    Hypokalemia      NEW MEDICATIONS  STARTED DURING THIS VISIT:  New Prescriptions   No medications on file     Note:  This document was prepared using Dragon voice recognition software and may include unintentional dictation errors.     Darel Hong, MD 04/28/18 [phone]

## 2018-04-28 NOTE — Progress Notes (Signed)
Arrived to room 151 via stretcher from er. Pt responds to name and can state her DOB, but disoriented to situation and time. Oriented to room, pt is able to answer some general questions with prompting to keep eyes open to long enough to answer questions. piv x3 intact. Sleeping when left undisturbed. No family present. Call bell in reach, bed alarm set.

## 2018-04-28 NOTE — Consult Note (Signed)
Reason for Consult:AMS Referring Physician: Vianne Bulls  CC: AMS  HPI: Cindy Burke is an 52 y.o. female who initially reports that she is amnestic of events that happened on yesterday but with further questioning appears to have gotten some memory back.  Her sister helps with presenting events today. Patient was reportedly in her usual state of health when she went to bed on 04/27/2018 PM. She woke up from sleep @~0115AM on 07/08, vomited, and then became confused/disoriented.  Remains disoriented.  Bed sheets contained urine. Had an event a few years ago when she became unresponsive and starred into space with some shaking.  Did not seek medical attention.    Past Medical History:  Diagnosis Date  . Hypertension     History reviewed. No pertinent surgical history.  Family History  Problem Relation Age of Onset  . Hypertension Mother   . Cancer Mother   . Diabetes Mother   . Hypertension Father   . Heart attack Father   . Diabetes Father     Social History:  reports that she has been smoking cigarettes.  She has smoked for the past 10.00 years. She has never used smokeless tobacco. She reports that she drinks about 0.6 oz of alcohol per week. She reports that she has current or past drug history. Drug: Other-see comments.  No Known Allergies  Medications:  I have reviewed the patient's current medications. Prior to Admission:  Medications Prior to Admission  Medication Sig Dispense Refill Last Dose  . hydrochlorothiazide (HYDRODIURIL) 25 MG tablet Take 1 tablet (25 mg total) by mouth daily. 90 tablet 1 04/27/2018 at 0800  . lisinopril (PRINIVIL,ZESTRIL) 10 MG tablet Take 1 tablet (10 mg total) by mouth daily. 90 tablet 1 04/27/2018 at 0800   Scheduled: . amLODipine  5 mg Oral Daily  . [START ON 04/29/2018] enoxaparin (LOVENOX) injection  40 mg Subcutaneous Q24H    ROS: History obtained from the patient  General ROS: negative for - chills, fatigue, fever, night sweats, weight  gain or weight loss Psychological ROS: memory difficulties Ophthalmic ROS: negative for - blurry vision, double vision, eye pain or loss of vision ENT ROS: negative for - epistaxis, nasal discharge, oral lesions, sore throat, tinnitus or vertigo Allergy and Immunology ROS: negative for - hives or itchy/watery eyes Hematological and Lymphatic ROS: negative for - bleeding problems, bruising or swollen lymph nodes Endocrine ROS: negative for - galactorrhea, hair pattern changes, polydipsia/polyuria or temperature intolerance Respiratory ROS: negative for - cough, hemoptysis, shortness of breath or wheezing Cardiovascular ROS: negative for - chest pain, dyspnea on exertion, edema or irregular heartbeat Gastrointestinal ROS: negative for - abdominal pain, diarrhea, hematemesis, nausea/vomiting or stool incontinence Genito-Urinary ROS: negative for - dysuria, hematuria, incontinence or urinary frequency/urgency Musculoskeletal ROS: left leg pain Neurological ROS: as noted in HPI Dermatological ROS: negative for rash and skin lesion changes  Physical Examination: Blood pressure (!) 147/85, pulse 77, temperature 98.1 F (36.7 C), temperature source Oral, resp. rate 20, height 5\' 5"  (1.651 m), weight 104.3 kg (230 lb), SpO2 100 %.  HEENT-  Normocephalic, no lesions, without obvious abnormality.  Normal external eye and conjunctiva.  Normal TM's bilaterally.  Normal auditory canals and external ears. Normal external nose, mucus membranes and septum.  Normal pharynx. Cardiovascular- S1, S2 normal, pulses palpable throughout   Lungs- chest clear, no wheezing, rales, normal symmetric air entry, Heart exam - S1, S2 normal, no murmur, no gallop, rate regular Abdomen- soft, non-tender; bowel sounds normal; no masses,  no organomegaly Extremities- LE edema Lymph-no adenopathy palpable Musculoskeletal-left knee pain Skin-warm and dry, no hyperpigmentation, vitiligo, or suspicious lesions  Neurological  Examination   Mental Status: Alert, oriented, thought content appropriate.  Can do simple calculations but unable to count by 7's.  Speech fluent without evidence of aphasia.  Able to follow 3 step commands without difficulty. Cranial Nerves: II: Discs flat bilaterally; Visual fields grossly normal, pupils equal, round, reactive to light and accommodation III,IV, VI: ptosis not present, extra-ocular motions intact bilaterally V,VII: smile symmetric, facial light touch sensation normal bilaterally VIII: hearing normal bilaterally IX,X: gag reflex present XI: bilateral shoulder shrug XII: midline tongue extension Motor: Right : Upper extremity   4/5 with drift and give-way weakness    Left:     Upper extremity   5/5  Lower extremity   5/5     Lower extremity   5/5 Tone and bulk:normal tone throughout; no atrophy noted Sensory: Pinprick and light touch decreased in the RUE Deep Tendon Reflexes: 2+ in the UE's trace at the knees and absent at the ankles Plantars: Right: downgoing   Left: downgoing Cerebellar: Normal finger-to-nose, normal rapid alternating movements and normal heel-to-shin testing bilaterally Gait: not tested due to safety concerns     Laboratory Studies:   Basic Metabolic Panel: Recent Labs  Lab 04/28/18 0223 04/28/18 0337 04/28/18 0628  NA 139  --  140  K 2.6*  --  3.4*  CL 104  --  106  CO2 22  --  25  GLUCOSE 124*  --  127*  BUN 30*  --  28*  CREATININE 1.06*  --  0.88  CALCIUM 8.9  --  8.5*  MG 2.1  --   --   PHOS  --  3.3  --     Liver Function Tests: Recent Labs  Lab 04/28/18 0223  AST 19  ALT 18  ALKPHOS 62  BILITOT 0.4  PROT 7.3  ALBUMIN 3.9   No results for input(s): LIPASE, AMYLASE in the last 168 hours. Recent Labs  Lab 04/28/18 0337  AMMONIA 14    CBC: Recent Labs  Lab 04/28/18 0223  WBC 7.3  NEUTROABS 4.1  HGB 11.3*  HCT 35.3  MCV 70.0*  PLT 222    Cardiac Enzymes: Recent Labs  Lab 04/28/18 0223  TROPONINI <0.03     BNP: Invalid input(s): POCBNP  CBG: No results for input(s): GLUCAP in the last 168 hours.  Microbiology: Results for orders placed or performed during the hospital encounter of 04/28/18  CSF culture     Status: None (Preliminary result)   Collection Time: 04/28/18  2:23 AM  Result Value Ref Range Status   Specimen Description CSF  Final   Special Requests NONE  Final   Gram Stain   Final    FEW WBC SEEN MANY RED BLOOD CELLS NO ORGANISMS SEEN Performed at [facility], [address], [place], [place] [address]    Culture PENDING  Incomplete   Report Status PENDING  Incomplete    Coagulation Studies: Recent Labs    04/28/18 0223  LABPROT 13.8  INR 1.07    Urinalysis:  Recent Labs  Lab 04/28/18 0223  COLORURINE YELLOW*  LABSPEC 1.014  PHURINE 5.0  GLUCOSEU NEGATIVE  HGBUR SMALL*  BILIRUBINUR NEGATIVE  KETONESUR NEGATIVE  PROTEINUR NEGATIVE  NITRITE NEGATIVE  LEUKOCYTESUR MODERATE*    Lipid Panel:     Component Value Date/Time   CHOL 169 01/28/2014   TRIG 120  01/28/2014   HDL 57 01/28/2014   LDLCALC 88 01/28/2014    HgbA1C:  Lab Results  Component Value Date   HGBA1C 5.7 01/28/2014    Urine Drug Screen:      Component Value Date/Time   LABOPIA NONE DETECTED 04/28/2018 0223   COCAINSCRNUR NONE DETECTED 04/28/2018 0223   LABBENZ NONE DETECTED 04/28/2018 0223   AMPHETMU NONE DETECTED 04/28/2018 0223   THCU POSITIVE (A) 04/28/2018 0223   LABBARB (A) 04/28/2018 0223    Result not available. Reagent lot number recalled by manufacturer.    Alcohol Level:  Recent Labs  Lab 04/28/18 0223  ETH 17*    Other results: EKG: sinus rhythm at 74 bpm.  Imaging: Ct Head Wo Contrast  Result Date: 04/28/2018 CLINICAL DATA:  Unexplained altered level of consciousness EXAM: CT HEAD WITHOUT CONTRAST TECHNIQUE: Contiguous axial images were obtained from the base of the skull through the vertex without intravenous contrast. COMPARISON:   06/06/2016 FINDINGS: Brain: No evidence of infarction, hemorrhage, hydrocephalus, extra-axial collection or mass lesion/mass effect. Vascular: No hyperdense vessel or unexpected calcification. Skull: Normal. Negative for fracture or focal lesion. Sinuses/Orbits: Negative IMPRESSION: Negative head CT Electronically Signed   By: Monte Fantasia M.D.   On: 04/28/2018 02:38   Mr Jeri Cos TI Contrast  Result Date: 04/28/2018 CLINICAL DATA:  Acute presentation with altered mental status. Confusion. EXAM: MRI HEAD WITHOUT AND WITH CONTRAST TECHNIQUE: Multiplanar, multiecho pulse sequences of the brain and surrounding structures were obtained without and with intravenous contrast. CONTRAST:  6mL MULTIHANCE GADOBENATE DIMEGLUMINE 529 MG/ML IV SOLN COMPARISON:  Head CT same day FINDINGS: Brain: Diffusion imaging does not show any acute or subacute infarction. The brainstem and cerebellum are normal. Within the cerebral hemispheres, there are scattered foci of abnormal T2 and FLAIR signal within the deep and subcortical white matter. These are nonspecific and could be seen with an early manifestation of small vessel disease, demyelinating disease or other forms of white matter disease including inflammatory processes such as Lyme disease. There is no mass effect, vasogenic edema or contrast enhancement. No cortical or large vessel territory abnormality. Vascular: Major vessels at the base of the brain show flow. Skull and upper cervical spine: Negative Sinuses/Orbits: Clear/normal Other: None IMPRESSION: No acute finding definitely established. Scattered foci of abnormal T2 and FLAIR signal within the hemispheric white matter. Most commonly, particularly in a patient with a history of hypertension, these would represent an early manifestation of small vessel disease. See above discussion. Electronically Signed   By: Nelson Chimes M.D.   On: 04/28/2018 10:14   Dg Chest Port 1 View  Result Date: 04/28/2018 CLINICAL DATA:   Altered mental status.  Shortness of breath. EXAM: PORTABLE CHEST 1 VIEW COMPARISON:  Radiographs 01/19/2015 FINDINGS: The cardiomediastinal contours are normal. Prior right upper lobe opacity has resolved. Pulmonary vasculature is normal. No consolidation, pleural effusion, or pneumothorax. No acute osseous abnormalities are seen. IMPRESSION: No acute pulmonary process. Electronically Signed   By: Jeb Levering M.D.   On: 04/28/2018 02:52     Assessment/Plan: 52 year old female presenting with altered mental status.  Etiology unclear.  MRI of the brain reviewed and shows no acute changes.  LP with elevated pressure of 33 but cell count is unremarkable.  Lab work without significant abnormalities.  Family describes event in the past that may suggest seizure.  Patient also with positive THC.  Unclear if thismay be causing an effect.  Further work up recommended.    Recommendations:  1.  EEG 2.  Doubt herpes encephalitis.  Would not start Acyclovir at this time.   3.  If cultures return negative would consider taper of antibiotics.   Alexis Goodell, MD Neurology [phone] 04/28/2018, 2:40 PM

## 2018-04-29 ENCOUNTER — Inpatient Hospital Stay: Payer: Self-pay

## 2018-04-29 DIAGNOSIS — G9340 Encephalopathy, unspecified: Secondary | ICD-10-CM

## 2018-04-29 LAB — RPR: RPR Ser Ql: NONREACTIVE

## 2018-04-29 LAB — VDRL, CSF: SYPHILIS VDRL QUANT CSF: NONREACTIVE

## 2018-04-29 LAB — HIV ANTIBODY (ROUTINE TESTING W REFLEX): HIV SCREEN 4TH GENERATION: NONREACTIVE

## 2018-04-29 MED ORDER — TRIAMCINOLONE ACETONIDE 40 MG/ML IJ SUSP
40.0000 mg | Freq: Once | INTRAMUSCULAR | Status: AC
  Administered 2018-04-30: 40 mg via INTRA_ARTICULAR
  Filled 2018-04-29: qty 1

## 2018-04-29 MED ORDER — BUPIVACAINE HCL (PF) 0.5 % IJ SOLN
10.0000 mL | Freq: Once | INTRAMUSCULAR | Status: AC
  Administered 2018-04-30: 10 mL
  Filled 2018-04-29: qty 10

## 2018-04-29 NOTE — Progress Notes (Signed)
Subjective: Patient reports that she feels as if she is coming back to normal but still can not remember what happened.  Continues to have left knee pain.  Objective: Current vital signs: BP (!) 154/76 (BP Location: Right Arm)   Pulse 69   Temp 98 F (36.7 C) (Oral)   Resp 20   Ht 5\' 5"  (1.651 m)   Wt 104.3 kg (230 lb)   SpO2 100%   BMI 38.27 kg/m  Vital signs in last 24 hours: Temp:  [98 F (36.7 C)-98.8 F (37.1 C)] 98 F (36.7 C) (07/09 0800) Pulse Rate:  [69-80] 69 (07/09 0800) Resp:  [20] 20 (07/09 0800) BP: (149-167)/(71-85) 154/76 (07/09 0800) SpO2:  [100 %] 100 % (07/09 0800)  Intake/Output from previous day: 07/08 0701 - 07/09 0700 In: 3508.3 [I.V.:2455; IV Piggyback:1053.3] Out: 1950 [Urine:1950] Intake/Output this shift: No intake/output data recorded. Nutritional status:  Diet Order           Diet Heart Room service appropriate? Yes; Fluid consistency: Thin  Diet effective now          Neurologic Exam: Mental Status: Alert, oriented, thought content appropriate. Speech fluent without evidence of aphasia.  Able to follow 3 step commands without difficulty. Cranial Nerves: II: Discs flat bilaterally; Visual fields grossly normal, pupils equal, round, reactive to light and accommodation III,IV, VI: ptosis not present, extra-ocular motions intact bilaterally V,VII: smile symmetric, facial light touch sensation normal bilaterally VIII: hearing normal bilaterally IX,X: gag reflex present XI: bilateral shoulder shrug XII: midline tongue extension Motor: 5/5 throughout excluding LLE where she gives little effort due to pain   Lab Results: Basic Metabolic Panel: Recent Labs  Lab 04/28/18 0223 04/28/18 0337 04/28/18 0628  NA 139  --  140  K 2.6*  --  3.4*  CL 104  --  106  CO2 22  --  25  GLUCOSE 124*  --  127*  BUN 30*  --  28*  CREATININE 1.06*  --  0.88  CALCIUM 8.9  --  8.5*  MG 2.1  --   --   PHOS  --  3.3  --     Liver Function  Tests: Recent Labs  Lab 04/28/18 0223  AST 19  ALT 18  ALKPHOS 62  BILITOT 0.4  PROT 7.3  ALBUMIN 3.9   No results for input(s): LIPASE, AMYLASE in the last 168 hours. Recent Labs  Lab 04/28/18 0337  AMMONIA 14    CBC: Recent Labs  Lab 04/28/18 0223  WBC 7.3  NEUTROABS 4.1  HGB 11.3*  HCT 35.3  MCV 70.0*  PLT 222    Cardiac Enzymes: Recent Labs  Lab 04/28/18 0223  TROPONINI <0.03    Lipid Panel: No results for input(s): CHOL, TRIG, HDL, CHOLHDL, VLDL, LDLCALC in the last 168 hours.  CBG: No results for input(s): GLUCAP in the last 168 hours.  Microbiology: Results for orders placed or performed during the hospital encounter of 04/28/18  CSF culture     Status: None (Preliminary result)   Collection Time: 04/28/18  2:23 AM  Result Value Ref Range Status   Specimen Description   Final    CSF Performed at [facility], [address], [place], [place] [address]    Special Requests   Final    NONE Performed at [facility], [address] [place],  [address]    Gram Stain   Final    FEW WBC SEEN MANY RED BLOOD CELLS  NO ORGANISMS SEEN Performed at [facility], [address], [place], [place] [address]    Culture   Final    NO GROWTH 1 DAY Performed at [facility], [place] [address] [place],  [address]    Report Status PENDING  Incomplete    Coagulation Studies: Recent Labs    04/28/18 0223  LABPROT 13.8  INR 1.07    Imaging: Dg Knee 1-2 Views Left  Result Date: 04/28/2018 CLINICAL DATA:  Pt complains of 10/10 left knee pain not relieved with prn tylenol or advil sudden onset today without injury EXAM: LEFT KNEE - 1-2 VIEW COMPARISON:  None. FINDINGS: No fracture of the proximal tibia or distal femur. Patella is normal. There is spurring of the lateral compartment. Small suprapatellar joint effusion. IMPRESSION: 1. No fracture or dislocation. 2. Mild osteoarthritis of the lateral compartment.  3. Small joint effusion. Electronically Signed   By: Suzy Bouchard M.D.   On: 04/28/2018 17:25   Ct Head Wo Contrast  Result Date: 04/28/2018 CLINICAL DATA:  Unexplained altered level of consciousness EXAM: CT HEAD WITHOUT CONTRAST TECHNIQUE: Contiguous axial images were obtained from the base of the skull through the vertex without intravenous contrast. COMPARISON:  06/06/2016 FINDINGS: Brain: No evidence of infarction, hemorrhage, hydrocephalus, extra-axial collection or mass lesion/mass effect. Vascular: No hyperdense vessel or unexpected calcification. Skull: Normal. Negative for fracture or focal lesion. Sinuses/Orbits: Negative IMPRESSION: Negative head CT Electronically Signed   By: Monte Fantasia M.D.   On: 04/28/2018 02:38   Mr Jeri Cos KV Contrast  Result Date: 04/28/2018 CLINICAL DATA:  Acute presentation with altered mental status. Confusion. EXAM: MRI HEAD WITHOUT AND WITH CONTRAST TECHNIQUE: Multiplanar, multiecho pulse sequences of the brain and surrounding structures were obtained without and with intravenous contrast. CONTRAST:  70mL MULTIHANCE GADOBENATE DIMEGLUMINE 529 MG/ML IV SOLN COMPARISON:  Head CT same day FINDINGS: Brain: Diffusion imaging does not show any acute or subacute infarction. The brainstem and cerebellum are normal. Within the cerebral hemispheres, there are scattered foci of abnormal T2 and FLAIR signal within the deep and subcortical white matter. These are nonspecific and could be seen with an early manifestation of small vessel disease, demyelinating disease or other forms of white matter disease including inflammatory processes such as Lyme disease. There is no mass effect, vasogenic edema or contrast enhancement. No cortical or large vessel territory abnormality. Vascular: Major vessels at the base of the brain show flow. Skull and upper cervical spine: Negative Sinuses/Orbits: Clear/normal Other: None IMPRESSION: No acute finding definitely established. Scattered  foci of abnormal T2 and FLAIR signal within the hemispheric white matter. Most commonly, particularly in a patient with a history of hypertension, these would represent an early manifestation of small vessel disease. See above discussion. Electronically Signed   By: Nelson Chimes M.D.   On: 04/28/2018 10:14   Dg Chest Port 1 View  Result Date: 04/28/2018 CLINICAL DATA:  Altered mental status.  Shortness of breath. EXAM: PORTABLE CHEST 1 VIEW COMPARISON:  Radiographs 01/19/2015 FINDINGS: The cardiomediastinal contours are normal. Prior right upper lobe opacity has resolved. Pulmonary vasculature is normal. No consolidation, pleural effusion, or pneumothorax. No acute osseous abnormalities are seen. IMPRESSION: No acute pulmonary process. Electronically Signed   By: Jeb Levering M.D.   On: 04/28/2018 02:52    Medications:  I have reviewed the patient's current medications. Scheduled: . amLODipine  5 mg Oral Daily  . enoxaparin (LOVENOX) injection  40 mg Subcutaneous Q24H  Assessment/Plan: Patient improving.  Initial CSF cultures show no growth after one day.   Patient has been afebrile.  EEG normal.    Recommendations: 1. Discontinue antibiotics and follow for at least 24 hours for development of worsening or fevers.     LOS: 1 day   Alexis Goodell, MD Neurology [phone] 04/29/2018  2:53 PM

## 2018-04-29 NOTE — Procedures (Addendum)
ELECTROENCEPHALOGRAM REPORT   Patient: Cindy Burke       Room #: 151A-AA EEG No. ID: [id number] Age: 52 y.o.        Sex: female Referring Physician: Vianne Bulls Report Date:  04/29/2018        Interpreting Physician: Alexis Goodell  History: [name] is an 52 y.o. female with altered mental status  Medications:  Amlodipine, Rocephin, Vancomycin  Conditions of Recording:  This is a 16 channel EEG carried out with the patient in the awake, drowsy and asleep states.  Description:  The waking background activity consists of a low voltage, symmetrical, fairly well organized, 9 Hz alpha activity, seen from the parieto-occipital and posterior temporal regions.  Low voltage fast activity, poorly organized, is seen anteriorly and is at times superimposed on more posterior regions.  A mixture of theta and alpha rhythms are seen from the central and temporal regions. The patient drowses with slowing to irregular, low voltage theta and beta activity.   The patient goes in to a light sleep with symmetrical sleep spindles, vertex central sharp transients and irregular slow activity.  No epileptiform activity is noted.   Hyperventilation was not performed.  Intermittent photic stimulation was performed but failed to illicit any change in the tracing.    IMPRESSION: Normal electroencephalogram, awake, asleep and with activation procedures. There are no focal lateralizing or epileptiform features.   Alexis Goodell, MD Neurology [phone] 04/29/2018, 2:49 PM

## 2018-04-29 NOTE — Progress Notes (Signed)
eeg completed ° °

## 2018-04-29 NOTE — Progress Notes (Signed)
Pharmacy Antibiotic Note  Cindy Burke is a 52 y.o. female admitted on 04/28/2018 with meningitis.  Pharmacy has been consulted for vancomycin dosing. Also on ceftriaxone. Received acyclovir x1 in the ED.  Plan: Vancomycin 1000 mg IV x1 then Vancomycin 1000 mg IV every 12 hours.  Goal trough 15-20 mcg/mL.  6h stacked dose. Trough before 4th dose.   Ke 0.06, half life 11.55, Vd 53.2 L  Height: 5\' 5"  (165.1 cm) Weight: 230 lb (104.3 kg) IBW/kg (Calculated) : 57  Temp (24hrs), Avg:98.3 F (36.8 C), Min:98 F (36.7 C), Max:98.8 F (37.1 C)  Recent Labs  Lab 04/28/18 0223 04/28/18 0628  WBC 7.3  --   CREATININE 1.06* 0.88    Estimated Creatinine Clearance: 88.6 mL/min (by C-G formula based on SCr of 0.88 mg/dL).    No Known Allergies  Antimicrobials this admission: Vanc 7/8>> ceftriaxone 7/8>> Acyclovir 7/8 x1  Dose adjustments this admission:   Microbiology results: CSF culture pending  Thank you for allowing pharmacy to be a part of this patient's care.  Rocky Morel 04/29/2018 8:27 AM

## 2018-04-29 NOTE — Progress Notes (Signed)
[facility] [place] NAME: Cindy Burke    MR#:  [id number]  DATE OF BIRTH:  09/16/1966  SUBJECTIVE: patient admitted for altered mental status, patient had multiple blood test, and model of the brain, EEG which are essentially within normal range.   CHIEF COMPLAINT:   Chief Complaint  Patient presents with  . Altered Mental Status   patient received IV fluids, IV antibiotics for possible meningitis. Lumbar puncture is negative for infection. Patient is alert, awake, oriented today complains of right knee pain. REVIEW OF SYSTEMS:    Review of Systems  Constitutional: Negative for chills and fever.  HENT: Negative for hearing loss.   Eyes: Negative for blurred vision, double vision and photophobia.  Respiratory: Negative for cough, hemoptysis and shortness of breath.   Cardiovascular: Negative for palpitations, orthopnea and leg swelling.  Gastrointestinal: Negative for abdominal pain, diarrhea and vomiting.  Genitourinary: Negative for dysuria and urgency.  Musculoskeletal: Positive for joint pain. Negative for myalgias and neck pain.  Skin: Negative for rash.  Neurological: Negative for dizziness, focal weakness, seizures, weakness and headaches.  Psychiatric/Behavioral: Negative for memory loss. The patient does not have insomnia.     Nutrition:  Tolerating Diet: Tolerating PT:      DRUG ALLERGIES:  No Known Allergies  VITALS:  Blood pressure (!) 154/76, pulse 69, temperature 98 F (36.7 C), temperature source Oral, resp. rate 20, height 5\' 5"  (1.651 m), weight 104.3 kg (230 lb), SpO2 100 %.  PHYSICAL EXAMINATION:   Physical Exam  GENERAL:  52 y.o.-year-old patient lying in the bed with no acute distress.  EYES: Pupils equal, round, reactive to light and accommodation. No scleral icterus. Extraocular muscles intact.  HEENT: Head atraumatic, normocephalic. Oropharynx and nasopharynx clear.  NECK:  Supple, no jugular  venous distention. No thyroid enlargement, no tenderness.  LUNGS: Normal breath sounds bilaterally, no wheezing, rales,rhonchi or crepitation. No use of accessory muscles of respiration.  CARDIOVASCULAR: S1, S2 normal. No murmurs, rubs, or gallops.  ABDOMEN: Soft, nontender, nondistended. Bowel sounds present. No organomegaly or mass.  EXTREMITIES: right knee pain. No obvious swelling.Marland Kitchen  NEUROLOGIC: Cranial nerves II through XII are intact. Muscle strength 5/5 in all extremities. Sensation intact. Gait not checked.  PSYCHIATRIC: The patient is alert and oriented x 3.  SKIN: No obvious rash, lesion, or ulcer.    LABORATORY PANEL:   CBC Recent Labs  Lab 04/28/18 0223  WBC 7.3  HGB 11.3*  HCT 35.3  PLT 222   ------------------------------------------------------------------------------------------------------------------  Chemistries  Recent Labs  Lab 04/28/18 0223 04/28/18 0628  NA 139 140  K 2.6* 3.4*  CL 104 106  CO2 22 25  GLUCOSE 124* 127*  BUN 30* 28*  CREATININE 1.06* 0.88  CALCIUM 8.9 8.5*  MG 2.1  --   AST 19  --   ALT 18  --   ALKPHOS 62  --   BILITOT 0.4  --    ------------------------------------------------------------------------------------------------------------------  Cardiac Enzymes Recent Labs  Lab 04/28/18 0223  TROPONINI <0.03   ------------------------------------------------------------------------------------------------------------------  RADIOLOGY:  Dg Knee 1-2 Views Left  Result Date: 04/28/2018 CLINICAL DATA:  Pt complains of 10/10 left knee pain not relieved with prn tylenol or advil sudden onset today without injury EXAM: LEFT KNEE - 1-2 VIEW COMPARISON:  None. FINDINGS: No fracture of the proximal tibia or distal femur. Patella is normal. There is spurring of the lateral compartment. Small suprapatellar joint effusion. IMPRESSION: 1. No fracture or dislocation. 2.  Mild osteoarthritis of the lateral compartment. 3. Small joint  effusion. Electronically Signed   By: Suzy Bouchard M.D.   On: 04/28/2018 17:25   Ct Head Wo Contrast  Result Date: 04/28/2018 CLINICAL DATA:  Unexplained altered level of consciousness EXAM: CT HEAD WITHOUT CONTRAST TECHNIQUE: Contiguous axial images were obtained from the base of the skull through the vertex without intravenous contrast. COMPARISON:  06/06/2016 FINDINGS: Brain: No evidence of infarction, hemorrhage, hydrocephalus, extra-axial collection or mass lesion/mass effect. Vascular: No hyperdense vessel or unexpected calcification. Skull: Normal. Negative for fracture or focal lesion. Sinuses/Orbits: Negative IMPRESSION: Negative head CT Electronically Signed   By: Monte Fantasia M.D.   On: 04/28/2018 02:38   Mr Jeri Cos NW Contrast  Result Date: 04/28/2018 CLINICAL DATA:  Acute presentation with altered mental status. Confusion. EXAM: MRI HEAD WITHOUT AND WITH CONTRAST TECHNIQUE: Multiplanar, multiecho pulse sequences of the brain and surrounding structures were obtained without and with intravenous contrast. CONTRAST:  14mL MULTIHANCE GADOBENATE DIMEGLUMINE 529 MG/ML IV SOLN COMPARISON:  Head CT same day FINDINGS: Brain: Diffusion imaging does not show any acute or subacute infarction. The brainstem and cerebellum are normal. Within the cerebral hemispheres, there are scattered foci of abnormal T2 and FLAIR signal within the deep and subcortical white matter. These are nonspecific and could be seen with an early manifestation of small vessel disease, demyelinating disease or other forms of white matter disease including inflammatory processes such as Lyme disease. There is no mass effect, vasogenic edema or contrast enhancement. No cortical or large vessel territory abnormality. Vascular: Major vessels at the base of the brain show flow. Skull and upper cervical spine: Negative Sinuses/Orbits: Clear/normal Other: None IMPRESSION: No acute finding definitely established. Scattered foci of  abnormal T2 and FLAIR signal within the hemispheric white matter. Most commonly, particularly in a patient with a history of hypertension, these would represent an early manifestation of small vessel disease. See above discussion. Electronically Signed   By: Nelson Chimes M.D.   On: 04/28/2018 10:14   Dg Chest Port 1 View  Result Date: 04/28/2018 CLINICAL DATA:  Altered mental status.  Shortness of breath. EXAM: PORTABLE CHEST 1 VIEW COMPARISON:  Radiographs 01/19/2015 FINDINGS: The cardiomediastinal contours are normal. Prior right upper lobe opacity has resolved. Pulmonary vasculature is normal. No consolidation, pleural effusion, or pneumothorax. No acute osseous abnormalities are seen. IMPRESSION: No acute pulmonary process. Electronically Signed   By: Jeb Levering M.D.   On: 04/28/2018 02:52     ASSESSMENT AND PLAN:   Active Problems:   Encephalopathy acute   1. Acute encephalopathy: likely secondary to polysubstance abuse rather than acute infection: urine toxicology positive for cannabinoids on admission. Patient admitted for possible meningitis/CVA/seizure: patient MRI of the brain did not check your changes, EEG results not available. But patient LP did not show any active infection cultures have been negative. The patient several floors, she looks much better than yesterday, she is alert,, very, oriented. Out of bed to chair today, possible discharged tomorrow.  2. Severe hypokalemia secondary to dehydration potassium improved from 2.6 to 3.4. 3. severe right knee pain, x-ray of the right knee did not shake your fracture right knee pain secondary to rigid degenerative disease. Consult all the public for intra-articular steroid injection.   All the records are reviewed and case discussed with Care Management/Social Workerr. Management plans discussed with the patient, family and they are in agreement.  CODE STATUS: full code  TOTAL TIME TAKING CARE OF THIS PATIENT: 19  minutes.    POSSIBLE D/C IN 1-2DAYS, DEPENDING ON CLINICAL CONDITION.   Epifanio Lesches M.D on 04/29/2018 at 1:53 PM  Between 7am to 6pm - Pager - [phone]  After 6pm go to [email or web address] - password [email or web address] [facility]  Office  [phone]  CC: Primary care physician; Patient, No Pcp Per

## 2018-04-29 NOTE — Consult Note (Signed)
Patient is a 52 year old seen for evaluation of severe left knee pain. She has a history of long-term knee pain but much worse recently. On exam she has no effusion to the left knee with the knee is not hot.  There is slight valgus deformity that is passively correctable.  Range of motion is 10 to 70 degrees range of motion with pain.  There is a palpable Baker's cyst.  Radiographs show significant lateral compartment osteoarthritis and patellofemoral arthritis with spurring visible on the nonweightbearing films.  Presumably arthritis but would be much worse on weightbearing film.  Impression is osteoarthritis left knee severe  Recommendation is for intra-articular steroid injection.  Risk benefits possible complications all discussed with patient

## 2018-04-30 LAB — HERPES SIMPLEX VIRUS(HSV) DNA BY PCR
HSV 1 DNA: NEGATIVE
HSV 2 DNA: NEGATIVE

## 2018-04-30 LAB — CREATININE, SERUM
Creatinine, Ser: 0.79 mg/dL (ref 0.44–1.00)
GFR calc Af Amer: >60 mL/min (ref >60)
GFR calc non Af Amer: >60 mL/min (ref >60)

## 2018-04-30 MED ORDER — HYDROCHLOROTHIAZIDE 25 MG PO TABS
25.0000 mg | ORAL_TABLET | Freq: Every day | ORAL | Status: DC
  Administered 2018-04-30 – 2018-05-01 (×2): 25 mg via ORAL
  Filled 2018-04-30 (×2): qty 1

## 2018-04-30 MED ORDER — AMLODIPINE BESYLATE 10 MG PO TABS
10.0000 mg | ORAL_TABLET | Freq: Every day | ORAL | Status: DC
Start: 2018-04-30 — End: 2018-04-30

## 2018-04-30 MED ORDER — LACTULOSE 10 GM/15ML PO SOLN
30.0000 g | Freq: Once | ORAL | Status: AC
  Administered 2018-04-30: 30 g via ORAL
  Filled 2018-04-30: qty 60

## 2018-04-30 MED ORDER — LISINOPRIL 10 MG PO TABS
10.0000 mg | ORAL_TABLET | Freq: Every day | ORAL | Status: DC
  Administered 2018-04-30 – 2018-05-01 (×2): 10 mg via ORAL
  Filled 2018-04-30 (×2): qty 1

## 2018-04-30 MED ORDER — AMLODIPINE BESYLATE 5 MG PO TABS
5.0000 mg | ORAL_TABLET | Freq: Every day | ORAL | Status: DC
  Administered 2018-04-30 – 2018-05-01 (×2): 5 mg via ORAL
  Filled 2018-04-30 (×2): qty 1

## 2018-04-30 NOTE — Progress Notes (Signed)
Pt alert and oriented up in chair in room. Still complains of some moderate left knee pain after injections but refuses pain meds at this time. Pt up to bathroom with 1 assist. Pt on room air. Chair alarm on, call bell and phone within reach. No other complaints or questions from pt at this time.

## 2018-04-30 NOTE — Op Note (Signed)
Procedure note Right knee injection Description of procedure: After informed consent was obtained the anterior lateral aspect of the knee was prepped and timeout procedure carried out.  After the Betadine had been used to prep the skin a 22-gauge needle was inserted into the joint and 40 mg Kenalog 5 cc of Marcaine was injected without difficulty.  A Band-Aid was then applied

## 2018-04-30 NOTE — Progress Notes (Addendum)
[facility] [place] NAME: Cindy Burke    MR#:  [id number]  DATE OF BIRTH:  [birth year]  sheis admitted for altered mental status, had LP done which is essentially showing no infection, patient EEG also is normal. She is alert, awake, oriented complaints of right knee pain.   CHIEF COMPLAINT:   Chief Complaint  Patient presents with  . Altered Mental Status   p stopped antibiotics is all CSFcultures  have been negative for infection REVIEW OF SYSTEMS:    Review of Systems  Constitutional: Negative for chills and fever.  HENT: Negative for hearing loss.   Eyes: Negative for blurred vision, double vision and photophobia.  Respiratory: Negative for cough, hemoptysis and shortness of breath.   Cardiovascular: Negative for palpitations, orthopnea and leg swelling.  Gastrointestinal: Negative for abdominal pain, diarrhea and vomiting.  Genitourinary: Negative for dysuria and urgency.  Musculoskeletal: Positive for joint pain. Negative for myalgias and neck pain.  Skin: Negative for rash.  Neurological: Negative for dizziness, focal weakness, seizures, weakness and headaches.  Psychiatric/Behavioral: Negative for memory loss. The patient does not have insomnia.     Nutrition:  Tolerating Diet: Tolerating PT:      DRUG ALLERGIES:  No Known Allergies  VITALS:  Blood pressure (!) 158/94, pulse 63, temperature 97.7 F (36.5 C), temperature source Oral, resp. rate 20, height 5\' 5"  (1.651 m), weight 104.3 kg (230 lb), SpO2 100 %.  PHYSICAL EXAMINATION:   Physical Exam  GENERAL:  52 y.o.-year-old patient lying in the bed with no acute distress.  EYES: Pupils equal, round, reactive to light and accommodation. No scleral icterus. Extraocular muscles intact.  HEENT: Head atraumatic, normocephalic. Oropharynx and nasopharynx clear.  NECK:  Supple, no jugular venous distention. No thyroid enlargement, no tenderness.  LUNGS: Normal  breath sounds bilaterally, no wheezing, rales,rhonchi or crepitation. No use of accessory muscles of respiration.  CARDIOVASCULAR: S1, S2 normal. No murmurs, rubs, or gallops.  ABDOMEN: Soft, nontender, nondistended. Bowel sounds present. No organomegaly or mass.  EXTREMITIES: right knee pain. No obvious swelling.Marland Kitchen  NEUROLOGIC: Cranial nerves II through XII are intact. Muscle strength 5/5 in all extremities. Sensation intact. Gait not checked.  PSYCHIATRIC: The patient is alert and oriented x 3.  SKIN: No obvious rash, lesion, or ulcer.    LABORATORY PANEL:   CBC Recent Labs  Lab 04/28/18 0223  WBC 7.3  HGB 11.3*  HCT 35.3  PLT 222   ------------------------------------------------------------------------------------------------------------------  Chemistries  Recent Labs  Lab 04/28/18 0223 04/28/18 0628 04/30/18 0332  NA 139 140  --   K 2.6* 3.4*  --   CL 104 106  --   CO2 22 25  --   GLUCOSE 124* 127*  --   BUN 30* 28*  --   CREATININE 1.06* 0.88 0.79  CALCIUM 8.9 8.5*  --   MG 2.1  --   --   AST 19  --   --   ALT 18  --   --   ALKPHOS 62  --   --   BILITOT 0.4  --   --    ------------------------------------------------------------------------------------------------------------------  Cardiac Enzymes Recent Labs  Lab 04/28/18 0223  TROPONINI <0.03   ------------------------------------------------------------------------------------------------------------------  RADIOLOGY:  Dg Knee 1-2 Views Left  Result Date: 04/28/2018 CLINICAL DATA:  Pt complains of 54/10 left knee pain not relieved with prn tylenol or advil sudden onset today without injury EXAM: LEFT KNEE - 1-2 VIEW COMPARISON:  None.  FINDINGS: No fracture of the proximal tibia or distal femur. Patella is normal. There is spurring of the lateral compartment. Small suprapatellar joint effusion. IMPRESSION: 1. No fracture or dislocation. 2. Mild osteoarthritis of the lateral compartment. 3. Small joint  effusion. Electronically Signed   By: Suzy Bouchard M.D.   On: 04/28/2018 17:25   Mr Jeri Cos MC Contrast  Result Date: 04/28/2018 CLINICAL DATA:  Acute presentation with altered mental status. Confusion. EXAM: MRI HEAD WITHOUT AND WITH CONTRAST TECHNIQUE: Multiplanar, multiecho pulse sequences of the brain and surrounding structures were obtained without and with intravenous contrast. CONTRAST:  14mL MULTIHANCE GADOBENATE DIMEGLUMINE 529 MG/ML IV SOLN COMPARISON:  Head CT same day FINDINGS: Brain: Diffusion imaging does not show any acute or subacute infarction. The brainstem and cerebellum are normal. Within the cerebral hemispheres, there are scattered foci of abnormal T2 and FLAIR signal within the deep and subcortical white matter. These are nonspecific and could be seen with an early manifestation of small vessel disease, demyelinating disease or other forms of white matter disease including inflammatory processes such as Lyme disease. There is no mass effect, vasogenic edema or contrast enhancement. No cortical or large vessel territory abnormality. Vascular: Major vessels at the base of the brain show flow. Skull and upper cervical spine: Negative Sinuses/Orbits: Clear/normal Other: None IMPRESSION: No acute finding definitely established. Scattered foci of abnormal T2 and FLAIR signal within the hemispheric white matter. Most commonly, particularly in a patient with a history of hypertension, these would represent an early manifestation of small vessel disease. See above discussion. Electronically Signed   By: Nelson Chimes M.D.   On: 04/28/2018 10:14     ASSESSMENT AND PLAN:   Active Problems:   Encephalopathy acute   1. Acute encephalopathy: likely secondary to polysubstance abuse rather than acute infection: urine toxicology positive for cannabinoids on admission. Patient status post LP, CSF cultures have been negative. EEG is normal. MRI of the brain did not show any stroke.Marland Kitchen Stop with IV  antibiotics, if patient is a relative day discharge home tomorrow.  2. Severe hypokalemia secondary to dehydration, diuretic induced, patient takes HCTZ home. Potassium improved from 2.6 to 3.4.  3. severe right knee pain, secondary to lateral compartment osteoarthritis, patellofemoral arthritis, patient is seen by orthopedic, recommended intra-articular steroid injection patient is scheduled for one today morning, after that patient needs to be out of bed to chair, PT evaluation possible discharge tomorrow.. 4. essential hypertension l; patient diastolic blood pressure is still high,resumehome blood pressure medicine with lisinopril, HCTZ> All the records are reviewed and case discussed with Care Management/Social Workerr. Management plans discussed with the patient, family and they are in agreement.  CODE STATUS: full code  TOTAL TIME TAKING CARE OF THIS PATIENT: 35 minutes.   POSSIBLE D/C IN 1-2DAYS, DEPENDING ON CLINICAL CONDITION.   Epifanio Lesches M.D on 04/30/2018 at 8:05 AM  Between 7am to 6pm - Pager - [phone]  After 6pm go to [email or web address] - password [email or web address] [facility]  Office  [phone]  CC: Primary care physician; Patient, No Pcp Per

## 2018-05-01 LAB — CSF CULTURE W GRAM STAIN: Culture: NO GROWTH

## 2018-05-01 LAB — N-METHYL-D-ASPARTATE RECPT.IGG

## 2018-05-01 LAB — CSF CULTURE

## 2018-05-01 MED ORDER — AMLODIPINE BESYLATE 5 MG PO TABS: 5.0000 mg | ORAL_TABLET | Freq: Every day | ORAL | 0 refills | Status: DC

## 2018-05-01 MED ORDER — OXYCODONE-ACETAMINOPHEN 5-325 MG PO TABS: 1.0000 | ORAL_TABLET | Freq: Four times a day (QID) | ORAL | 0 refills | Status: DC | PRN

## 2018-05-01 NOTE — Progress Notes (Signed)
Discharge note;  Discharge instructions and prescriptions given to pt. IVs removed. No questions from pt at this time. Will assist pt to get dressed and wait on ride home

## 2018-05-01 NOTE — Discharge Summary (Signed)
Cindy Burke, is a 52 y.o. female  DOB 1965-12-29  MRN [id number].  Admission date:  04/28/2018  Admitting Physician  Arta Silence, MD  Discharge Date:  05/01/2018   Primary MD  Patient, No Pcp Per  Recommendations for primary care physician for things to follow:   [facility]   Admission Diagnosis  Hypokalemia [E87.6] Encephalopathy [G93.40]   Discharge Diagnosis  Hypokalemia [E87.6] Encephalopathy [G93.40]    Active Problems:   Encephalopathy acute      Past Medical History:  Diagnosis Date  . Hypertension     History reviewed. No pertinent surgical history.     History of present illness and  Hospital Course:     Kindly see H&P for history of present illness and admission details, please review complete Labs, Consult reports and Test reports for all details in brief  HPI  from the history and physical done on the day of admission 52 year old female patient with history of essential hypertension admitted because of altered mental status.   Hospital Course  Acute encephalopathy: likely secondary to polysubstance abuse rather than acute infection: urine toxicology positive for cannabinoids on admission. Patient status post LP, CSF cultures have been negative. EEG is normal. MRI of the brain did not show any stroke..  She  aggressive IV antibiotics to cover for meningitis including IV Rocephin, vancomycin, seen by neurology did not think patient has any intracranial infection discontinued IV abx,s. Did not have any fever. It was kept NPO, patient received IV fluids to. Patient mental status improved, started on the diet, decreased  IV hydration..  2. Severe hypokalemia secondary to dehydration, diuretic induced, patient takes HCTZ home. Potassium improved from 2.6 to  3.4.  3. severe right knee pain, secondary to lateral compartment osteoarthritis, patellofemoral arthritis, patient is seen by orthopedic, recommended intra-articular steroid injection , received right knee steroid injection by Dr. Ellison Hughs knee pain improved after that. Gave limited  supply of oxycodone prescription.    4. essential hypertension l; patient diastolic blood pressure was running high on the hospital  Due to right knee pain.resumed home blood pressure medicine with lisinopril, HCTZ/, Norvasc.     Discharge Condition:stable   Follow UP  Follow-up Information    [facility]. Schedule an appointment as soon as possible for a visit in 1 week(s).             Discharge Instructions  and  Discharge Medications     Allergies as of 05/01/2018   No Known Allergies     Medication List    TAKE these medications   amLODipine 5 MG tablet Commonly known as:  NORVASC Take 1 tablet (5 mg total) by mouth daily.   hydrochlorothiazide 25 MG tablet Commonly known as:  HYDRODIURIL Take 1 tablet (25 mg total) by mouth daily.   lisinopril 10 MG tablet Commonly known as:  PRINIVIL,ZESTRIL Take 1 tablet (10 mg total) by mouth daily.   oxyCODONE-acetaminophen 5-325 MG tablet Commonly known as:  PERCOCET/ROXICET Take 1-2 tablets by mouth every 6 (six) hours as needed for severe pain.         Diet and Activity recommendation: See Discharge Instructions above   Consults obtained - neurology, othopedic   Major procedures and Radiology Reports - PLEASE review detailed and final reports for all details, in brief -      Dg Knee 1-2 Views Left  Result Date: 04/28/2018 CLINICAL DATA:  Pt complains of 10/10 left  knee pain not relieved with prn tylenol or advil sudden onset today without injury EXAM: LEFT KNEE - 1-2 VIEW COMPARISON:  None. FINDINGS: No fracture of the proximal tibia or distal femur. Patella is normal. There is spurring of the lateral compartment.  Small suprapatellar joint effusion. IMPRESSION: 1. No fracture or dislocation. 2. Mild osteoarthritis of the lateral compartment. 3. Small joint effusion. Electronically Signed   By: Suzy Bouchard M.D.   On: 04/28/2018 17:25   Ct Head Wo Contrast  Result Date: 04/28/2018 CLINICAL DATA:  Unexplained altered level of consciousness EXAM: CT HEAD WITHOUT CONTRAST TECHNIQUE: Contiguous axial images were obtained from the base of the skull through the vertex without intravenous contrast. COMPARISON:  06/06/2016 FINDINGS: Brain: No evidence of infarction, hemorrhage, hydrocephalus, extra-axial collection or mass lesion/mass effect. Vascular: No hyperdense vessel or unexpected calcification. Skull: Normal. Negative for fracture or focal lesion. Sinuses/Orbits: Negative IMPRESSION: Negative head CT Electronically Signed   By: Monte Fantasia M.D.   On: 04/28/2018 02:38   Mr Jeri Cos GE Contrast  Result Date: 04/28/2018 CLINICAL DATA:  Acute presentation with altered mental status. Confusion. EXAM: MRI HEAD WITHOUT AND WITH CONTRAST TECHNIQUE: Multiplanar, multiecho pulse sequences of the brain and surrounding structures were obtained without and with intravenous contrast. CONTRAST:  56mL MULTIHANCE GADOBENATE DIMEGLUMINE 529 MG/ML IV SOLN COMPARISON:  Head CT same day FINDINGS: Brain: Diffusion imaging does not show any acute or subacute infarction. The brainstem and cerebellum are normal. Within the cerebral hemispheres, there are scattered foci of abnormal T2 and FLAIR signal within the deep and subcortical white matter. These are nonspecific and could be seen with an early manifestation of small vessel disease, demyelinating disease or other forms of white matter disease including inflammatory processes such as Lyme disease. There is no mass effect, vasogenic edema or contrast enhancement. No cortical or large vessel territory abnormality. Vascular: Major vessels at the base of the brain show flow. Skull and upper  cervical spine: Negative Sinuses/Orbits: Clear/normal Other: None IMPRESSION: No acute finding definitely established. Scattered foci of abnormal T2 and FLAIR signal within the hemispheric white matter. Most commonly, particularly in a patient with a history of hypertension, these would represent an early manifestation of small vessel disease. See above discussion. Electronically Signed   By: Nelson Chimes M.D.   On: 04/28/2018 10:14   Dg Chest Port 1 View  Result Date: 04/28/2018 CLINICAL DATA:  Altered mental status.  Shortness of breath. EXAM: PORTABLE CHEST 1 VIEW COMPARISON:  Radiographs 01/19/2015 FINDINGS: The cardiomediastinal contours are normal. Prior right upper lobe opacity has resolved. Pulmonary vasculature is normal. No consolidation, pleural effusion, or pneumothorax. No acute osseous abnormalities are seen. IMPRESSION: No acute pulmonary process. Electronically Signed   By: Jeb Levering M.D.   On: 04/28/2018 02:52    Micro Results    Recent Results (from the past 240 hour(s))  CSF culture     Status: None   Collection Time: 04/28/18  2:23 AM  Result Value Ref Range Status   Specimen Description   Final    CSF Performed at [facility], [address] [place], [place] [address]    Special Requests   Final    NONE Performed at [facility], [address] [place], [place] [address]    Gram Stain   Final    FEW WBC SEEN MANY RED BLOOD CELLS NO ORGANISMS SEEN Performed at [facility], [address] [place], [place] [address]    Culture  Final    NO GROWTH 3 DAYS Performed at [facility], [place] [address], [place], [place] [address]    Report Status 05/01/2018 FINAL  Final       Today   Subjective:   Cindy Burke today has no headache,no chest abdominal pain,no new weakness tingling or numbness, feels much better wants to go home today.  Objective:   Blood pressure (!) 163/82, pulse 63, temperature 97.9 F (36.6  C), temperature source Oral, resp. rate 19, height 5\' 5"  (1.651 m), weight 104.3 kg (230 lb), SpO2 100 %.   Intake/Output Summary (Last 24 hours) at 05/01/2018 1426 Last data filed at 05/01/2018 0900 Gross per 24 hour  Intake 480 ml  Output -  Net 480 ml    Exam Awake Alert, Oriented x 3, No new F.N deficits, Normal affect [place].AT,PERRAL Supple Neck,No JVD, No cervical lymphadenopathy appriciated.  Symmetrical Chest wall movement, Good air movement bilaterally, CTAB RRR,No Gallops,Rubs or new Murmurs, No Parasternal Heave +ve B.Sounds, Abd Soft, Non tender, No organomegaly appriciated, No rebound -guarding or rigidity. No Cyanosis, Clubbing or edema, No new Rash or bruise  Data Review   CBC w Diff:  Lab Results  Component Value Date   WBC 7.3 04/28/2018   HGB 11.3 (L) 04/28/2018   HGB 9.5 (L) 12/22/2015   HCT 35.3 04/28/2018   HCT 32.0 (L) 12/22/2015   PLT 222 04/28/2018   PLT 156 01/19/2015   LYMPHOPCT 30 04/28/2018   LYMPHOPCT 14.0 01/19/2015   MONOPCT 8 04/28/2018   MONOPCT 4.3 01/19/2015   EOSPCT 6 04/28/2018   EOSPCT 0.2 01/19/2015   BASOPCT 1 04/28/2018   BASOPCT 0.7 01/19/2015    CMP:  Lab Results  Component Value Date   NA 140 04/28/2018   NA 138 12/22/2015   NA 138 01/19/2015   K 3.4 (L) 04/28/2018   K 3.1 (L) 01/19/2015   CL 106 04/28/2018   CL 107 01/19/2015   CO2 25 04/28/2018   CO2 23 01/19/2015   BUN 28 (H) 04/28/2018   BUN 18 12/22/2015   BUN 17 01/19/2015   CREATININE 0.79 04/30/2018   CREATININE 0.94 01/19/2015   PROT 7.3 04/28/2018   PROT 7.8 11/21/2013   ALBUMIN 3.9 04/28/2018   ALBUMIN 3.1 (L) 11/21/2013   BILITOT 0.4 04/28/2018   BILITOT 0.6 11/21/2013   ALKPHOS 62 04/28/2018   ALKPHOS 80 11/21/2013   AST 19 04/28/2018   AST 14 (L) 11/21/2013   ALT 18 04/28/2018   ALT 13 11/21/2013  .   Total Time in preparing paper work, data evaluation and todays exam - 35 minutes  Epifanio Lesches M.D on 05/01/2018 at 2:26 PM     Note: This dictation was prepared with Dragon dictation along with smaller phrase technology. Any transcriptional errors that result from this process are unintentional.

## 2018-05-01 NOTE — Care Management (Signed)
Met with patient at bedside. [facility]. No further needs identified.

## 2018-05-05 ENCOUNTER — Telehealth: Payer: Self-pay | Admitting: Pharmacy Technician

## 2018-05-05 NOTE — Telephone Encounter (Signed)
Provided patient with new patient packet to obtain [facility] services. Patient understands that [facility] must receive 2019 financial documentation in order to determine eligibility.  [facility]

## 2018-05-06 ENCOUNTER — Other Ambulatory Visit: Payer: Self-pay

## 2018-05-06 DIAGNOSIS — I10 Essential (primary) hypertension: Principal | ICD-10-CM

## 2018-05-06 DIAGNOSIS — E1159 Type 2 diabetes mellitus with other circulatory complications: Secondary | ICD-10-CM

## 2018-05-06 MED ORDER — LISINOPRIL 10 MG PO TABS: 10.0000 mg | ORAL_TABLET | Freq: Every day | ORAL | 1 refills | Status: DC

## 2018-05-09 LAB — B. BURGDORFI ANTIBODIES, CSF: B. Burgdorferi IgM Ab Index: UNDETERMINED Index

## 2018-05-15 ENCOUNTER — Encounter: Payer: Self-pay | Admitting: Adult Health

## 2018-05-15 ENCOUNTER — Ambulatory Visit: Payer: Self-pay | Admitting: Adult Health

## 2018-05-15 VITALS — BP 151/80 | HR 72 | Temp 98.6°F | Ht 65.0 in | Wt 247.4 lb

## 2018-05-15 DIAGNOSIS — G43009 Migraine without aura, not intractable, without status migrainosus: Secondary | ICD-10-CM

## 2018-05-15 DIAGNOSIS — E876 Hypokalemia: Secondary | ICD-10-CM

## 2018-05-15 DIAGNOSIS — I10 Essential (primary) hypertension: Secondary | ICD-10-CM

## 2018-05-15 MED ORDER — HYDROCHLOROTHIAZIDE 25 MG PO TABS: 25.0000 mg | ORAL_TABLET | Freq: Every day | ORAL | 3 refills | Status: DC

## 2018-05-15 MED ORDER — AMLODIPINE BESYLATE 5 MG PO TABS: 5.0000 mg | ORAL_TABLET | Freq: Every day | ORAL | 3 refills | Status: DC

## 2018-05-15 MED ORDER — LISINOPRIL 10 MG PO TABS: 10.0000 mg | ORAL_TABLET | Freq: Every day | ORAL | 3 refills | Status: DC

## 2018-05-15 NOTE — Progress Notes (Signed)
  Subjective:     Patient ID: Cindy Burke, female   DOB: [birth year], 52 y.o.   MRN: [id number]  HPI 52 y/o female who presents for f/u hypertension and post-hospital follow-up for acute encephalopathy and hypokalemia. She is c/o a headache that started yesterday and has progressively gotten worse. She rates as it as an 8/10, mostly in the frontal part of her head, steady, associated with nausea. She has a h/o migraines and reports two headaches a day but this one is more intense than usual. She reports taking all BP medications as prescribed.  She was recently hospitalized with hypokalemia and encephalopathy. All her work-up was negative Her left knee pain is much better, comes and goes; worse with activity. She was diagnosed with lateral compartment osteoarthritis.  Denies chest pain palpitations nausea vomiting and dizziness  Review of Systems  Constitutional: Negative for appetite change, fatigue and fever.  Respiratory: Negative for chest tightness and shortness of breath.   Cardiovascular: Negative.   Gastrointestinal: Negative.   Endocrine: Negative.   Musculoskeletal: Positive for arthralgias (Left knee pain).  Skin: Negative.   Neurological: Positive for headaches. Negative for dizziness and numbness.       Objective:   Physical Exam  Constitutional: She is oriented to person, place, and time. She appears well-developed and well-nourished.  Eyes: Pupils are equal, round, and reactive to light. Conjunctivae and EOM are normal.  Neck: Normal range of motion. Neck supple.  Cardiovascular: Normal rate, regular rhythm, normal heart sounds and intact distal pulses.  Pulmonary/Chest: Effort normal and breath sounds normal.  Abdominal: Soft. Bowel sounds are normal.  Musculoskeletal: Normal range of motion.  Mild pain with flexion and extension of the left knee  Neurological: She is alert and oriented to person, place, and time.  Skin: Skin is warm and dry. Capillary refill takes  less than 2 seconds.  Psychiatric: She has a normal mood and affect.      Assessment:     1. Essential hypertension Uncontrolled.  Will add Norvasc 5 mg p.o. daily.  Will increase lisinopril if blood pressure still elevated at next visit. - lisinopril (PRINIVIL,ZESTRIL) 10 MG tablet; Take 1 tablet (10 mg total) by mouth daily.  Dispense: 90 tablet; Refill: 3 - hydrochlorothiazide (HYDRODIURIL) 25 MG tablet; Take 1 tablet (25 mg total) by mouth daily.  Dispense: 90 tablet; Refill: 3   2. Migraine without aura and without status migrainosus, not intractable Patient encouraged to stay hydrated and take Motrin 600 mg as needed Return to clinic if symptoms worsen  3. Morbid (severe) obesity due to excess calories (HCC) Weight loss interventions reviewed  4. Hypokalemia - Comp Met (CMET) - Magnesium - Phosphorus -We will start potassium 10 mEq daily if potassium level is still low  5. OA Continue as needed Tylenol and alternate with Motrin If no improvement in symptoms will initiate Ortho referral  Cindy Burke ANP-BC Pager [phone] or [phone]  NB: This document was prepared using Dragon voice recognition software and may include unintentional dictation errors.

## 2018-05-16 LAB — COMPREHENSIVE METABOLIC PANEL
A/G RATIO: 1.5 (ref 1.2–2.2)
ALK PHOS: 79 IU/L (ref 39–117)
ALT: 25 IU/L (ref 0–32)
AST: 15 IU/L (ref 0–40)
Albumin: 4.6 g/dL (ref 3.5–5.5)
BILIRUBIN TOTAL: 0.5 mg/dL (ref 0.0–1.2)
BUN / CREAT RATIO: 28 — AB (ref 9–23)
BUN: 28 mg/dL — ABNORMAL HIGH (ref 6–24)
CHLORIDE: 101 mmol/L (ref 96–106)
CO2: 26 mmol/L (ref 20–29)
CREATININE: 1 mg/dL (ref 0.57–1.00)
Calcium: 9.5 mg/dL (ref 8.7–10.2)
GFR calc Af Amer: 74 mL/min/{1.73_m2} (ref >59)
GFR calc non Af Amer: 64 mL/min/{1.73_m2} (ref >59)
GLOBULIN, TOTAL: 3 g/dL (ref 1.5–4.5)
Glucose: 108 mg/dL — ABNORMAL HIGH (ref 65–99)
POTASSIUM: 3.3 mmol/L — AB (ref 3.5–5.2)
SODIUM: 142 mmol/L (ref 134–144)
Total Protein: 7.6 g/dL (ref 6.0–8.5)

## 2018-05-16 LAB — MAGNESIUM: MAGNESIUM: 2.1 mg/dL (ref 1.6–2.3)

## 2018-05-16 LAB — TSH: TSH: 0.692 u[IU]/mL (ref 0.450–4.500)

## 2018-05-16 LAB — PHOSPHORUS: PHOSPHORUS: 3.4 mg/dL (ref 2.5–4.5)

## 2018-06-02 MED ORDER — POTASSIUM CHLORIDE ER 10 MEQ PO TBCR: 10.0000 meq | EXTENDED_RELEASE_TABLET | Freq: Every day | ORAL | 1 refills | Status: DC

## 2018-06-19 ENCOUNTER — Ambulatory Visit: Payer: Self-pay | Admitting: Adult Health

## 2018-06-19 ENCOUNTER — Encounter: Payer: Self-pay | Admitting: Adult Health

## 2018-06-19 VITALS — BP 150/76 | HR 82 | Temp 97.9°F | Ht 65.0 in | Wt 254.9 lb

## 2018-06-19 DIAGNOSIS — M150 Primary generalized (osteo)arthritis: Secondary | ICD-10-CM

## 2018-06-19 DIAGNOSIS — I10 Essential (primary) hypertension: Secondary | ICD-10-CM

## 2018-06-19 DIAGNOSIS — M159 Polyosteoarthritis, unspecified: Secondary | ICD-10-CM

## 2018-06-19 DIAGNOSIS — E876 Hypokalemia: Secondary | ICD-10-CM

## 2018-06-19 MED ORDER — AMLODIPINE BESYLATE 5 MG PO TABS: 5.0000 mg | ORAL_TABLET | Freq: Every day | ORAL | 3 refills | Status: DC

## 2018-06-19 MED ORDER — LISINOPRIL 10 MG PO TABS: 10.0000 mg | ORAL_TABLET | Freq: Every day | ORAL | 3 refills | Status: DC

## 2018-06-19 MED ORDER — IBUPROFEN 800 MG PO TABS: 800.0000 mg | ORAL_TABLET | Freq: Three times a day (TID) | ORAL | 1 refills | Status: DC | PRN

## 2018-06-19 MED ORDER — HYDROCHLOROTHIAZIDE 25 MG PO TABS: 25.0000 mg | ORAL_TABLET | Freq: Every day | ORAL | 3 refills | Status: DC

## 2018-06-19 NOTE — Progress Notes (Signed)
Patient: Cindy Burke Female    DOB: [birth year]   52 y.o.   MRN: [id number] Visit Date: 06/19/2018  Today's Provider: Deforest Hoyles, NP   Chief Complaint  Patient presents with  . Arm Pain    c/o right arm pain from mid bicep area to wrist x 4 wks. Left knee pain x1 wk.   Subjective:    HPI 52 y/o female presenting for routine f/u of hypertension. She is c/o right arm pain and left knee pain x 4 weeks. Symptoms started gradually and have gotten worse over time.  She denies any associated symptoms.  Right arm pain is localized around the bicep area to the wrist.  She states that the pain starts from her bicep area and radiates down her wrist.  Her left knee pain is chronic and she denies any interval worsening.  Her blood pressure during her last visit was 151/80.  Her blood pressure still elevated at this visit.  She reports taking her medication as prescribed.  She denies chest pain palpitations headaches dizziness nausea and vomiting.  Her last labs showed a low potassium level of 3.3.  She is on potassium supplements..  Patient offers no other complaints  No Known Allergies Previous Medications   AMLODIPINE (NORVASC) 5 MG TABLET    Take 1 tablet (5 mg total) by mouth daily.   HYDROCHLOROTHIAZIDE (HYDRODIURIL) 25 MG TABLET    Take 1 tablet (25 mg total) by mouth daily.   LISINOPRIL (PRINIVIL,ZESTRIL) 10 MG TABLET    Take 1 tablet (10 mg total) by mouth daily.   OXYCODONE-ACETAMINOPHEN (PERCOCET/ROXICET) 5-325 MG TABLET    Take 1-2 tablets by mouth every 6 (six) hours as needed for severe pain.   POTASSIUM CHLORIDE (K-DUR) 10 MEQ TABLET    Take 1 tablet (10 mEq total) by mouth daily.    Review of Systems  Constitutional: Negative.   Respiratory: Negative.   Cardiovascular: Negative.   Musculoskeletal: Positive for arthralgias.  Skin: Negative.   Neurological: Negative.     Social History   Tobacco Use  . Smoking status: Current Some Day Smoker    Years: 10.00   Types: Cigarettes  . Smokeless tobacco: Never Used  . Tobacco comment: only 1 cigarette this month   Substance Use Topics  . Alcohol use: Yes    Alcohol/week: 1.0 standard drinks    Types: 1 Cans of beer per week   Objective:   BP (!) 150/76 (BP Location: Left Arm, Patient Position: Sitting, Cuff Size: Large)   Pulse 82   Temp 97.9 F (36.6 C) (Oral)   Ht '5\' 5"'  (1.651 m)   Wt 254 lb 14.4 oz (115.6 kg)   LMP  (LMP Unknown)   BMI 42.42 kg/m   Physical Exam  Constitutional: She is oriented to person, place, and time. She appears well-developed and well-nourished.  HENT:  Head: Normocephalic.  Mouth/Throat: Oropharynx is clear and moist.  Eyes: Pupils are equal, round, and reactive to light. EOM are normal.  Neck: Normal range of motion. Neck supple.  Cardiovascular: Normal rate, regular rhythm, normal heart sounds and intact distal pulses.  Pulmonary/Chest: Effort normal and breath sounds normal.  Abdominal: Soft. Bowel sounds are normal.  Musculoskeletal: Normal range of motion. She exhibits tenderness (Pain with flexion and extension of the left knee, palpation of the shoulder joints, crepitations with range of motion).  Neurological: She is alert and oriented to person, place, and time.  Skin: Skin is warm. Capillary refill takes less  than 2 seconds.  Nursing note and vitals reviewed.  Assessment & Plan:   1. Hypokalemia Stop potassium supplements.  Will repeat labs today - Comp Met (CMET)  2. Essential hypertension Continue current medications.  Will titrate medications if blood pressure is still elevated at her next visit.  Patient advised to monitor blood pressure and keep a log of readings and to call the clinic if blood pressure readings are consistently greater than 150 - hydrochlorothiazide (HYDRODIURIL) 25 MG tablet; Take 1 tablet (25 mg total) by mouth daily.  Dispense: 90 tablet; Refill: 3 - lisinopril (PRINIVIL,ZESTRIL) 10 MG tablet; Take 1 tablet (10 mg total)  by mouth at bedtime.  Dispense: 90 tablet; Refill: 3  3. Primary osteoarthritis involving multiple joints Ibuprofen 800 mg every 8 hours as needed Daily exercises reviewed to alleviate joint stiffness and pain  4. Morbid obesity ([place]) Weight loss lifestyle modification advice given.  5. Tobacco use disorder Smoking cessation advice given  Deforest Hoyles, NP   [facility]

## 2018-06-20 LAB — COMPREHENSIVE METABOLIC PANEL
A/G RATIO: 1.4 (ref 1.2–2.2)
ALK PHOS: 75 IU/L (ref 39–117)
ALT: 20 IU/L (ref 0–32)
AST: 15 IU/L (ref 0–40)
Albumin: 4.3 g/dL (ref 3.5–5.5)
BILIRUBIN TOTAL: 0.4 mg/dL (ref 0.0–1.2)
BUN/Creatinine Ratio: 26 — ABNORMAL HIGH (ref 9–23)
BUN: 26 mg/dL — ABNORMAL HIGH (ref 6–24)
CHLORIDE: 102 mmol/L (ref 96–106)
CO2: 27 mmol/L (ref 20–29)
Calcium: 10.4 mg/dL — ABNORMAL HIGH (ref 8.7–10.2)
Creatinine, Ser: 1 mg/dL (ref 0.57–1.00)
GFR calc Af Amer: 74 mL/min/{1.73_m2} (ref >59)
GFR, EST NON AFRICAN AMERICAN: 64 mL/min/{1.73_m2} (ref >59)
GLOBULIN, TOTAL: 3.1 g/dL (ref 1.5–4.5)
Glucose: 84 mg/dL (ref 65–99)
POTASSIUM: 4.4 mmol/L (ref 3.5–5.2)
SODIUM: 141 mmol/L (ref 134–144)
Total Protein: 7.4 g/dL (ref 6.0–8.5)

## 2018-06-28 ENCOUNTER — Encounter: Payer: Self-pay | Admitting: Adult Health

## 2018-07-16 ENCOUNTER — Telehealth: Payer: Self-pay

## 2018-07-16 NOTE — Telephone Encounter (Signed)
Pt has appts scheduled for 09/26 and 10/03. Left msg for pt to return call regarding those appts. According to doc's note from aug 29 she does not need an appt for 3 months.

## 2018-07-17 ENCOUNTER — Other Ambulatory Visit: Payer: Self-pay

## 2018-07-24 ENCOUNTER — Ambulatory Visit: Payer: Self-pay | Admitting: Adult Health

## 2018-08-05 ENCOUNTER — Ambulatory Visit: Payer: Self-pay | Admitting: Gerontology

## 2018-08-12 ENCOUNTER — Ambulatory Visit: Payer: Self-pay | Admitting: Gerontology

## 2018-09-12 ENCOUNTER — Other Ambulatory Visit: Payer: Self-pay | Admitting: Dentistry

## 2018-09-12 ENCOUNTER — Ambulatory Visit
Admission: RE | Admit: 2018-09-12 | Discharge: 2018-09-12 | Disposition: A | Discharge status: Home / Self Care | Payer: Disability Insurance | Source: Ambulatory Visit | Attending: Dentistry | Admitting: Dentistry

## 2018-09-12 DIAGNOSIS — R52 Pain, unspecified: Secondary | ICD-10-CM | POA: Diagnosis present

## 2018-09-12 DIAGNOSIS — R29898 Other symptoms and signs involving the musculoskeletal system: Secondary | ICD-10-CM

## 2018-09-12 DIAGNOSIS — M1712 Unilateral primary osteoarthritis, left knee: Secondary | ICD-10-CM | POA: Diagnosis not present

## 2018-09-15 ENCOUNTER — Telehealth: Payer: Self-pay

## 2018-09-15 NOTE — Telephone Encounter (Signed)
Pt wants refills on potassium pills, fluid pills and blood pressure medicine. Pt states that this is where she normally calls to get her refills. I explained to pt that this is the pain clinic and those medications are usually prescribed by a primary care dr.

## 2018-09-15 NOTE — Telephone Encounter (Signed)
This patient is not a patient of ours.

## 2018-11-04 ENCOUNTER — Ambulatory Visit: Payer: Self-pay | Admitting: Gerontology

## 2018-11-18 ENCOUNTER — Ambulatory Visit: Payer: Self-pay | Admitting: Specialist

## 2018-12-23 ENCOUNTER — Ambulatory Visit: Payer: Self-pay

## 2019-01-29 ENCOUNTER — Other Ambulatory Visit: Payer: Self-pay

## 2019-03-12 ENCOUNTER — Ambulatory Visit: Payer: Self-pay | Admitting: Adult Health Nurse Practitioner

## 2019-03-12 ENCOUNTER — Other Ambulatory Visit: Payer: Self-pay

## 2019-03-12 DIAGNOSIS — N939 Abnormal uterine and vaginal bleeding, unspecified: Secondary | ICD-10-CM

## 2019-03-12 DIAGNOSIS — I10 Essential (primary) hypertension: Secondary | ICD-10-CM

## 2019-03-12 NOTE — Progress Notes (Signed)
  Patient: Cindy Burke Female    DOB: Feb 29, 1966   53 y.o.   MRN: [id number] Visit Date: 03/12/2019  Today's Provider: Staci Acosta, NP   No chief complaint on file.  Subjective:    HPI  Telephonic visit.   Pt states that she has been lightheaded and thinks her BP is elevated. Last visit BP was 150/76. No medication titration was done.   Started having bleeding on Saturday- states she usually doesn't have a menstrual period. States she continues to have spotting when she wipes. Has hx of fibroids- with no interventions.      No Known Allergies Previous Medications   AMLODIPINE (NORVASC) 5 MG TABLET    Take 1 tablet (5 mg total) by mouth daily.   HYDROCHLOROTHIAZIDE (HYDRODIURIL) 25 MG TABLET    Take 1 tablet (25 mg total) by mouth daily.   IBUPROFEN (ADVIL,MOTRIN) 800 MG TABLET    Take 1 tablet (800 mg total) by mouth every 8 (eight) hours as needed for moderate pain.   LISINOPRIL (PRINIVIL,ZESTRIL) 10 MG TABLET    Take 1 tablet (10 mg total) by mouth at bedtime.    Review of Systems  All other systems reviewed and are negative.   Social History   Tobacco Use  . Smoking status: Current Some Day Smoker    Years: 10.00    Types: Cigarettes  . Smokeless tobacco: Never Used  . Tobacco comment: only 1 cigarette this month   Substance Use Topics  . Alcohol use: Yes    Alcohol/week: 1.0 standard drinks    Types: 1 Cans of beer per week   Objective:   There were no vitals taken for this visit.  Physical Exam  No BP.     Assessment & Plan:         HTN:  Will have patient come and pick up BP cuff next week. Take BP daily and keep a log to review.  Goal BP <140/90.  Continue current medication regimen.  Encourage low salt diet and exercise.  FU  In 2 weeks to review BPs.   Referral to [facility] for pap smear and mammogram.  May need GYN referral for fibroid management if bleeding continues.  Will check CBC on next labs.   Scheduled for routine labs.    Staci Acosta, NP   [facility]

## 2019-03-18 ENCOUNTER — Telehealth: Payer: Self-pay

## 2019-03-18 NOTE — Telephone Encounter (Signed)
Patient called requesting [facility] write a letter to [facility] Management stating that she needs her medications ASAP.  I contacted Medication Management and there are refills available. She needs to update annual paperwork with Medication Management before medications can be refilled.  I notified patient that she needs to contact Med Mgmt and bring updated paperwork to them.

## 2019-03-19 ENCOUNTER — Other Ambulatory Visit: Payer: Self-pay

## 2019-03-19 DIAGNOSIS — I10 Essential (primary) hypertension: Secondary | ICD-10-CM

## 2019-03-19 MED ORDER — AMLODIPINE BESYLATE 5 MG PO TABS: 5.0000 mg | ORAL_TABLET | Freq: Every day | ORAL | 0 refills | Status: DC

## 2019-03-19 MED ORDER — HYDROCHLOROTHIAZIDE 25 MG PO TABS: 25.0000 mg | ORAL_TABLET | Freq: Every day | ORAL | 0 refills | Status: DC

## 2019-03-24 ENCOUNTER — Other Ambulatory Visit: Payer: Self-pay

## 2019-03-24 ENCOUNTER — Ambulatory Visit: Payer: Self-pay | Admitting: Licensed Clinical Social Worker

## 2019-03-24 DIAGNOSIS — F4322 Adjustment disorder with anxiety: Secondary | ICD-10-CM

## 2019-03-24 NOTE — BH Specialist Note (Signed)
Integrated Behavioral Health Comprehensive Clinical Assessment Via Phone  MRN: [id number] Name: Cindy Burke  Type of Service: Integrated Behavioral Health-Individual Interpretor: No. Interpretor Name and Language: Not applicable.  PRESENTING CONCERNS: Cindy Burke is a 53 y.o. female accompanied by herself. Cindy Burke was referred to [facility] clinician by Leda Min CMA for a mental health assessment.  Previous mental health services Have you ever been treated for a mental health problem? No If "Yes", when were you treated and whom did you see?  Have you ever been hospitalized for mental health treatment? No Have you ever been treated for any of the following? Past Psychiatric History/Hospitalization(s): Anxiety: Yes Cindy Burke reports that she has been dealing with anxiety for awhile now. She explains that her anxiety is due to not being able to get adequate rest, her grand kids being too loud talking over the TV, and her cousin who is combative with her. She reports that when she is not working that she helps take care of her five grandchildren. Her symptoms of anxiety include: feeling nervous, anxious, and on edge nearly every day, not being able to stop or control her worrying, worrying too much about different things, trouble relaxing, restlessness, difficulty concentrating, and becoming easily annoyed and irritable.  Bipolar Disorder: Negative Depression: Negative Cindy Burke denies feeling down and depressed. She denies suicidal and homicidal thoughts.  Mania: Negative Psychosis: Negative Schizophrenia: Negative Personality Disorder: Negative Hospitalization for psychiatric illness: Negative History of Electroconvulsive Shock Therapy: Negative Prior Suicide Attempts: Negative Have you ever had thoughts of harming yourself or others or attempted suicide? No plan to harm self or others  Medical history  has a past medical history of Hypertension  and Passed out. Primary Care Physician: Patient, No Pcp Per Date of last physical exam:  Allergies: No Known Allergies Current medications:  Outpatient Encounter Medications as of 03/24/2019  Medication Sig  . amLODipine (NORVASC) 5 MG tablet Take 1 tablet (5 mg total) by mouth daily.  . hydrochlorothiazide (HYDRODIURIL) 25 MG tablet Take 1 tablet (25 mg total) by mouth daily.  Marland Kitchen ibuprofen (ADVIL,MOTRIN) 800 MG tablet Take 1 tablet (800 mg total) by mouth every 8 (eight) hours as needed for moderate pain.  Marland Kitchen lisinopril (PRINIVIL,ZESTRIL) 10 MG tablet Take 1 tablet (10 mg total) by mouth at bedtime.   No facility-administered encounter medications on file as of 03/24/2019.    Have you ever had any serious medication reactions? No Is there any history of mental health problems or substance abuse in your family? No Has anyone in your family been hospitalized for mental health treatment? No  Social/family history Who lives in your current household? Cindy Burke lives with her daughter and five grandchildren in an apartment through section 8 housing.  What is your family of origin, childhood history? Cindy Burke was born in [place], [place]. Where were you born? See above. Where did you grow up? Cindy Burke was raised in [place], [place]. How many different homes have you lived in? A few. Describe your childhood: Cindy Burke reports that her childhood was okay. Do you have siblings, step/half siblings? Yes- Cindy Burke has four sisters and two brothers. She is the second to the oldest. What are their names, relation, sex, age? She did not divulge this information. Are your parents separated or divorced? No Cindy Burke reports that both of her parents are deceased. She reports that her mom died in 10/17/18 due to emphysema and her dad  passed away in January of this year due to suffering from a cardiac arrest during surgery being performed on one of his legs. What are your social supports?  Cindy Burke repots that she has no one in her support system.  Education How many grades have you completed? 12th grade Did you have any problems in school? 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   Sleep Usual bedtime varies.  Sleeping arrangements: alone. Problems with snoring: No Obstructive sleep apnea is not a concern. Problems with nightmares: No Problems with night terrors: No Problems with sleepwalking: No  Trauma/Abuse history Have you ever experienced or been exposed to any form of abuse? No Have you ever experienced or been exposed to something traumatic? No  Substance use Do you use alcohol, nicotine or caffeine? no alcohol use How old were you when you first tasted alcohol? Cindy Burke denies abusing alcohol or other drugs. Have you ever used illicit drugs or abused prescription medications? Cindy Burke smokes two cigarettes a day.  Mental status General appearance/Behavior:  Unable to assess due to phone visit. Eye contact: Absent Motor behavior: Unable to assess due to phone visit. Speech: Normal Level of consciousness: Alert Mood: Euthymic Affect: Appropriate Anxiety level: Moderate Thought process: Coherent Thought content: WNL Perception: Normal Judgment: Fair Insight: Present  Diagnosis No diagnosis found.  GOALS ADDRESSED: Patient will reduce symptoms of: anxiety and stress and increase knowledge and/or ability of: coping skills, healthy habits, self-management skills and stress reduction and also: Increase healthy adjustment to current life circumstances              INTERVENTIONS: Interventions utilized: Psychoeducation and/or Health Education Standardized Assessments completed: GAD-7 and PHQ 9   ASSESSMENT/OUTCOME:  Cindy Burke is a 53 year old African American female who was referred by Leda Min CMA for a mental health assessment. Cindy Burke reports that she has been dealing with a  lot of stress and anxiety lived with her daughter plus five grandchildren. She has not seen a therapist and has not been diagnosed with a mental illness. She denies ever being hospitalized for mental illness. She denies any history of abusing drugs or alcohol. She is interested in therapy and taking a prescribed medication for her anxiety.   Ms. Cones is an established patient at [facility]. She has a history of heavy menstrual cycles, and essential hypertension. She smokes two cigarettes a day.  Ms. Bardsley lives with her daughter and five grandchildren. She works part time as an in Neurosurgeon. She receives a 192 dollars in food stamps a month. She has two children 25 year old fraternal twins-son and daughter by a previous relationship. She has a boyfriend of three to four months. She denies any history of mental illness and substance abuse in the family. She reports that she has no one in her support system.   PLAN: Case consultation with Dr. Octavia Heir, MD, Psychiatric consultant on Tuesday June 9th @ 9 am.  Scheduled next visit:   [facility]ger Clinical Social Work

## 2019-03-26 ENCOUNTER — Other Ambulatory Visit: Payer: Self-pay

## 2019-03-26 ENCOUNTER — Ambulatory Visit: Payer: Self-pay | Admitting: Urology

## 2019-03-26 ENCOUNTER — Encounter: Payer: Self-pay | Admitting: Urology

## 2019-03-26 VITALS — BP 147/83

## 2019-03-26 DIAGNOSIS — I10 Essential (primary) hypertension: Secondary | ICD-10-CM

## 2019-03-26 NOTE — Progress Notes (Signed)
Virtual Visit via Telephone Note  I connected with Cindy Burke on 03/26/19 at  7:15 PM EDT by telephone and verified that I am speaking with the correct person using two identifiers.  Location: Patient: Home Provider: [facility]    I discussed the limitations, risks, security and privacy concerns of performing an evaluation and management service by telephone and the availability of in person appointments. I also discussed with the patient that there may be a patient responsible charge related to this service. The patient expressed understanding and agreed to proceed.   History of Present Illness: BP still not at goal   Observations/Objective: Answers question appropriately. Has company cannot talk long.   Assessment and Plan:  1. HTN Increase lisinopril to 20 mg daily   Follow Up Instructions:  Follow up in two weeks to review BP after increasing lisinopril to 20 mg daily   I discussed the assessment and treatment plan with the patient. The patient was provided an opportunity to ask questions and all were answered. The patient agreed with the plan and demonstrated an understanding of the instructions.   The patient was advised to call back or seek an in-person evaluation if the symptoms worsen or if the condition fails to improve as anticipated.  I provided 10 minutes of non-face-to-face time during this encounter.   Deon Ivey, PA-C

## 2019-04-07 ENCOUNTER — Ambulatory Visit: Payer: Self-pay | Admitting: Licensed Clinical Social Worker

## 2019-04-07 ENCOUNTER — Telehealth: Payer: Self-pay | Admitting: Licensed Clinical Social Worker

## 2019-04-07 NOTE — Telephone Encounter (Signed)
Clinician attempted to contact the patient twice at the time of her scheduled phone session and there was not an option to leave a voicemail.

## 2019-04-08 ENCOUNTER — Other Ambulatory Visit: Payer: Self-pay

## 2019-04-09 ENCOUNTER — Ambulatory Visit: Payer: Self-pay

## 2019-04-23 ENCOUNTER — Ambulatory Visit: Payer: Self-pay | Admitting: Adult Health Nurse Practitioner

## 2019-04-23 ENCOUNTER — Other Ambulatory Visit: Payer: Self-pay

## 2019-04-23 DIAGNOSIS — I10 Essential (primary) hypertension: Secondary | ICD-10-CM

## 2019-04-23 NOTE — Progress Notes (Signed)
  Patient: Cindy Burke Female    DOB: 12-20-1965   53 y.o.   MRN: [id number] Visit Date: 04/23/2019  Today's Provider: Staci Acosta, NP   No chief complaint on file.  Subjective:    HPI   Telephonic visit.    Seen two weeks ago for HTN- Lisinopril was increased to 20mg  daily.   Pt states that she cannot get her medication as of yet because she needed to send her ID to [facility].   States BP was 154/90 today. Only taking Norvasc and HCTZ.    No Known Allergies Previous Medications   AMLODIPINE (NORVASC) 5 MG TABLET    Take 1 tablet (5 mg total) by mouth daily.   HYDROCHLOROTHIAZIDE (HYDRODIURIL) 25 MG TABLET    Take 1 tablet (25 mg total) by mouth daily.   IBUPROFEN (ADVIL,MOTRIN) 800 MG TABLET    Take 1 tablet (800 mg total) by mouth every 8 (eight) hours as needed for moderate pain.   LISINOPRIL (PRINIVIL,ZESTRIL) 10 MG TABLET    Take 1 tablet (10 mg total) by mouth at bedtime.    Review of Systems  All other systems reviewed and are negative.   Social History   Tobacco Use  . Smoking status: Current Some Day Smoker    Years: 10.00    Types: Cigarettes  . Smokeless tobacco: Never Used  . Tobacco comment: only 1 cigarette this month   Substance Use Topics  . Alcohol use: Yes    Alcohol/week: 1.0 standard drinks    Types: 1 Cans of beer per week   Objective:   There were no vitals taken for this visit.  Physical Exam  No PE.     Assessment & Plan:            Continue Norvasc and HCTZ.  Reschedule appointment for next week.  Will touch base with [facility] to get the patient the medication.    Staci Acosta, NP   [facility]

## 2019-04-30 ENCOUNTER — Ambulatory Visit: Payer: Self-pay | Admitting: Adult Health Nurse Practitioner

## 2019-04-30 ENCOUNTER — Other Ambulatory Visit: Payer: Self-pay

## 2019-04-30 DIAGNOSIS — I10 Essential (primary) hypertension: Secondary | ICD-10-CM

## 2019-04-30 NOTE — Progress Notes (Signed)
  Patient: Cindy Burke Female    DOB: 06/15/1966   53 y.o.   MRN: [id number] Visit Date: 04/30/2019  Today's Provider: [facility]   No chief complaint on file.  Subjective:    HPI  Telephonic visit.   Last week patient was seen for BP FU and unable to get her Lisinopril form [facility].  Pt states thatt she picked up her medications yesterday.  States that she is taking lisinopril 20mg  daily.     No Known Allergies Previous Medications   AMLODIPINE (NORVASC) 5 MG TABLET    Take 1 tablet (5 mg total) by mouth daily.   HYDROCHLOROTHIAZIDE (HYDRODIURIL) 25 MG TABLET    Take 1 tablet (25 mg total) by mouth daily.   IBUPROFEN (ADVIL,MOTRIN) 800 MG TABLET    Take 1 tablet (800 mg total) by mouth every 8 (eight) hours as needed for moderate pain.   LISINOPRIL (PRINIVIL,ZESTRIL) 10 MG TABLET    Take 1 tablet (10 mg total) by mouth at bedtime.    Review of Systems  All other systems reviewed and are negative.   Social History   Tobacco Use  . Smoking status: Current Some Day Smoker    Years: 10.00    Types: Cigarettes  . Smokeless tobacco: Never Used  . Tobacco comment: only 1 cigarette this month   Substance Use Topics  . Alcohol use: Yes    Alcohol/week: 1.0 standard drinks    Types: 1 Cans of beer per week   Objective:   There were no vitals taken for this visit.  Physical Exam  No PE.      BP: 146/84 today.   Assessment & Plan:         HTN:  Improved. .  Goal BP <140/90.  Continue current medication regimen.  Encourage low salt diet and exercise.   FU in 6 weeks for BP.  Labs a week prior.      [facility]

## 2019-05-27 ENCOUNTER — Other Ambulatory Visit: Payer: Self-pay

## 2019-05-27 ENCOUNTER — Ambulatory Visit: Payer: Disability Insurance | Admitting: Pharmacy Technician

## 2019-05-27 DIAGNOSIS — Z79899 Other long term (current) drug therapy: Secondary | ICD-10-CM

## 2019-05-27 NOTE — Progress Notes (Signed)
Patient verbally agreed to all terms of the [facility] contract.   Patient approved to receive medication assistance at [facility] as long as eligibility criteria continues to be met.    Provided patient with community resource material based on her particular needs.    [facility]

## 2019-06-04 ENCOUNTER — Other Ambulatory Visit: Payer: Self-pay

## 2019-06-04 DIAGNOSIS — I10 Essential (primary) hypertension: Secondary | ICD-10-CM

## 2019-06-05 LAB — CBC
Hematocrit: 37.7 % (ref 34.0–46.6)
Hemoglobin: 11.9 g/dL (ref 11.1–15.9)
MCH: 23 pg — ABNORMAL LOW (ref 26.6–33.0)
MCHC: 31.6 g/dL (ref 31.5–35.7)
MCV: 73 fL — ABNORMAL LOW (ref 79–97)
Platelets: 275 10*3/uL (ref 150–450)
RBC: 5.17 x10E6/uL (ref 3.77–5.28)
RDW: 14.7 % (ref 11.7–15.4)
WBC: 7.3 10*3/uL (ref 3.4–10.8)

## 2019-06-05 LAB — LIPID PANEL
Chol/HDL Ratio: 4.3 ratio (ref 0.0–4.4)
Cholesterol, Total: 192 mg/dL (ref 100–199)
HDL: 45 mg/dL (ref >39)
LDL Calculated: 104 mg/dL — ABNORMAL HIGH (ref 0–99)
Triglycerides: 217 mg/dL — ABNORMAL HIGH (ref 0–149)
VLDL Cholesterol Cal: 43 mg/dL — ABNORMAL HIGH (ref 5–40)

## 2019-06-05 LAB — COMPREHENSIVE METABOLIC PANEL
ALT: 13 IU/L (ref 0–32)
AST: 10 IU/L (ref 0–40)
Albumin/Globulin Ratio: 1.5 (ref 1.2–2.2)
Albumin: 4.3 g/dL (ref 3.8–4.9)
Alkaline Phosphatase: 75 IU/L (ref 39–117)
BUN/Creatinine Ratio: 24 — ABNORMAL HIGH (ref 9–23)
BUN: 22 mg/dL (ref 6–24)
Bilirubin Total: 0.3 mg/dL (ref 0.0–1.2)
CO2: 26 mmol/L (ref 20–29)
Calcium: 9.8 mg/dL (ref 8.7–10.2)
Chloride: 99 mmol/L (ref 96–106)
Creatinine, Ser: 0.92 mg/dL (ref 0.57–1.00)
GFR calc Af Amer: 82 mL/min/{1.73_m2} (ref >59)
GFR calc non Af Amer: 71 mL/min/{1.73_m2} (ref >59)
Globulin, Total: 2.9 g/dL (ref 1.5–4.5)
Glucose: 87 mg/dL (ref 65–99)
Potassium: 3.6 mmol/L (ref 3.5–5.2)
Sodium: 140 mmol/L (ref 134–144)
Total Protein: 7.2 g/dL (ref 6.0–8.5)

## 2019-06-05 LAB — HEMOGLOBIN A1C
Est. average glucose Bld gHb Est-mCnc: 123 mg/dL
Hgb A1c MFr Bld: 5.9 % — ABNORMAL HIGH (ref 4.8–5.6)

## 2019-06-05 LAB — TSH: TSH: 1.49 u[IU]/mL (ref 0.450–4.500)

## 2019-06-11 ENCOUNTER — Ambulatory Visit: Payer: Self-pay | Admitting: Adult Health Nurse Practitioner

## 2019-06-11 ENCOUNTER — Other Ambulatory Visit: Payer: Self-pay

## 2019-06-11 VITALS — BP 114/73 | HR 78 | Temp 98.6°F | Ht 66.0 in | Wt 261.0 lb

## 2019-06-11 DIAGNOSIS — I10 Essential (primary) hypertension: Secondary | ICD-10-CM

## 2019-06-11 DIAGNOSIS — R7303 Prediabetes: Secondary | ICD-10-CM

## 2019-06-11 MED ORDER — LISINOPRIL 20 MG PO TABS: 20.0000 mg | ORAL_TABLET | Freq: Every day | ORAL | 5 refills | Status: DC

## 2019-06-11 NOTE — Progress Notes (Signed)
  Patient: Cindy Burke Female    DOB: 1966-10-21   53 y.o.   MRN: [id number] Visit Date: 06/11/2019  Today's Provider: [facility]   Chief Complaint  Patient presents with  . Arm Pain    shooting pain up both arms, random times and sometimes when sleeping   Subjective:    HPI   BP is much better controlled since addition of lisinopril. Taking medications as directed. Reviewed BP log- BPs have been <578 systolic over the past week.   Pt states that she had a shooting pain that radiated down her right arm. Only happens in the left arm when she lays on it. Pain is self-limiting. Pain has been going on for a week. Denies weakness in arms or hands.     No Known Allergies Previous Medications   AMLODIPINE (NORVASC) 5 MG TABLET    Take 1 tablet (5 mg total) by mouth daily.   HYDROCHLOROTHIAZIDE (HYDRODIURIL) 25 MG TABLET    Take 1 tablet (25 mg total) by mouth daily.   IBUPROFEN (ADVIL,MOTRIN) 800 MG TABLET    Take 1 tablet (800 mg total) by mouth every 8 (eight) hours as needed for moderate pain.   LISINOPRIL (ZESTRIL) 20 MG TABLET    Take 1 tablet (20 mg total) by mouth at bedtime.    Review of Systems  All other systems reviewed and are negative.   Social History   Tobacco Use  . Smoking status: Current Some Day Smoker    Years: 10.00    Types: Cigarettes  . Smokeless tobacco: Never Used  . Tobacco comment: only 1 cigarette this month   Substance Use Topics  . Alcohol use: Yes    Alcohol/week: 1.0 standard drinks    Types: 1 Cans of beer per week   Objective:   BP 114/73 (Cuff Size: Large)   Pulse 78   Temp 98.6 F (37 C)   Ht 5\' 6"  (1.676 m)   Wt 261 lb (118.4 kg)   SpO2 100%   BMI 42.13 kg/m   Physical Exam Vitals signs reviewed.  Constitutional:      Appearance: Normal appearance.  HENT:     Head: Normocephalic and atraumatic.  Cardiovascular:     Rate and Rhythm: Normal rate and regular rhythm.  Pulmonary:     Effort: Pulmonary effort is  normal.     Breath sounds: Normal breath sounds.  Abdominal:     General: Bowel sounds are normal.     Palpations: Abdomen is soft.  Skin:    General: Skin is warm and dry.  Neurological:     Mental Status: She is alert.         Assessment & Plan:         HTN:  Controlled.   Goal BP <140/90.  Continue current medication regimen.  Encourage low salt diet and exercise.    Discussed pre-diabetes and healthy lifestyle changes.   Monitor arm pain- supportive care. if worsening RTC.       [facility]

## 2019-06-16 ENCOUNTER — Emergency Department
Admission: EM | Admit: 2019-06-16 | Discharge: 2019-06-16 | Disposition: A | Discharge status: Home / Self Care | Payer: Self-pay | Attending: Emergency Medicine | Admitting: Emergency Medicine

## 2019-06-16 ENCOUNTER — Other Ambulatory Visit: Payer: Self-pay

## 2019-06-16 ENCOUNTER — Encounter: Payer: Self-pay | Admitting: Emergency Medicine

## 2019-06-16 ENCOUNTER — Emergency Department: Payer: Self-pay

## 2019-06-16 DIAGNOSIS — E876 Hypokalemia: Secondary | ICD-10-CM

## 2019-06-16 DIAGNOSIS — I10 Essential (primary) hypertension: Secondary | ICD-10-CM | POA: Insufficient documentation

## 2019-06-16 DIAGNOSIS — F17210 Nicotine dependence, cigarettes, uncomplicated: Secondary | ICD-10-CM | POA: Insufficient documentation

## 2019-06-16 DIAGNOSIS — Z79899 Other long term (current) drug therapy: Secondary | ICD-10-CM | POA: Insufficient documentation

## 2019-06-16 DIAGNOSIS — R42 Dizziness and giddiness: Secondary | ICD-10-CM | POA: Insufficient documentation

## 2019-06-16 LAB — URINALYSIS, COMPLETE (UACMP) WITH MICROSCOPIC
Bacteria, UA: NONE SEEN
Bilirubin Urine: NEGATIVE
Glucose, UA: NEGATIVE mg/dL
Hgb urine dipstick: NEGATIVE
Ketones, ur: NEGATIVE mg/dL
Nitrite: NEGATIVE
Protein, ur: NEGATIVE mg/dL
Specific Gravity, Urine: 1.025 (ref 1.005–1.030)
pH: 5 (ref 5.0–8.0)

## 2019-06-16 LAB — BASIC METABOLIC PANEL
Anion gap: 11 (ref 5–15)
BUN: 21 mg/dL — ABNORMAL HIGH (ref 6–20)
CO2: 24 mmol/L (ref 22–32)
Calcium: 9.4 mg/dL (ref 8.9–10.3)
Chloride: 104 mmol/L (ref 98–111)
Creatinine, Ser: 0.88 mg/dL (ref 0.44–1.00)
GFR calc Af Amer: >60 mL/min (ref >60)
GFR calc non Af Amer: >60 mL/min (ref >60)
Glucose, Bld: 118 mg/dL — ABNORMAL HIGH (ref 70–99)
Potassium: 3.1 mmol/L — ABNORMAL LOW (ref 3.5–5.1)
Sodium: 139 mmol/L (ref 135–145)

## 2019-06-16 LAB — CBC
HCT: 37.6 % (ref 36.0–46.0)
Hemoglobin: 11.9 g/dL — ABNORMAL LOW (ref 12.0–15.0)
MCH: 22.5 pg — ABNORMAL LOW (ref 26.0–34.0)
MCHC: 31.6 g/dL (ref 30.0–36.0)
MCV: 70.9 fL — ABNORMAL LOW (ref 80.0–100.0)
Platelets: 310 10*3/uL (ref 150–400)
RBC: 5.3 MIL/uL — ABNORMAL HIGH (ref 3.87–5.11)
RDW: 14.4 % (ref 11.5–15.5)
WBC: 7.5 10*3/uL (ref 4.0–10.5)
nRBC: 0 % (ref 0.0–0.2)

## 2019-06-16 LAB — GLUCOSE, CAPILLARY: Glucose-Capillary: 124 mg/dL — ABNORMAL HIGH (ref 70–99)

## 2019-06-16 MED ORDER — LACTATED RINGERS IV BOLUS
1000.0000 mL | Freq: Once | INTRAVENOUS | Status: AC
  Administered 2019-06-16: 1000 mL via INTRAVENOUS

## 2019-06-16 MED ORDER — POTASSIUM CHLORIDE 20 MEQ/15ML (10%) PO SOLN
40.0000 meq | Freq: Once | ORAL | Status: AC
  Administered 2019-06-16: 40 meq via ORAL
  Filled 2019-06-16: qty 30

## 2019-06-16 MED ORDER — ONDANSETRON HCL 4 MG/2ML IJ SOLN
4.0000 mg | Freq: Once | INTRAMUSCULAR | Status: AC
  Administered 2019-06-16: 4 mg via INTRAVENOUS
  Filled 2019-06-16: qty 2

## 2019-06-16 NOTE — ED Notes (Signed)
Pt states she started feeling dizzy yesterday at work. Has been out of lisinopril, states "my daughter brought my 2 yesterday. Last night I took my blood pressure and it was too high. When I thought that lisinopril kick in I thought it was going to knock me out." states dizziness feels like room spinning. States blurred vision as well. Denies double vision. Moving all extremities on own. Denies having vertigo.   States chronic L knee pain that she states feels like a sharp pain. Denies injury to knee from fall.   A&O, speaking in complete sentences. No neuro deficits noted.

## 2019-06-16 NOTE — ED Provider Notes (Signed)
[facility] Emergency Department Provider Note   ____________________________________________   First MD Initiated Contact with Patient 06/16/19 [phone]     (approximate)  I have reviewed the triage vital signs and the nursing notes.   HISTORY  Chief Complaint Dizziness    HPI Cindy Burke is a 53 y.o. female with past medical history of hypertension who presents to the ED complaining of dizziness.  Patient reports she has felt dizzy and lightheaded since being at work yesterday.  She feels like she may pass out at times, but is never done so.  She denies any associated headache, vision changes, speech changes, numbness, or weakness.  She has not had any chest pain, shortness of breath, fevers, or cough.  She is concerned that her blood pressure has been elevated, states she ran out of her lisinopril recently.        Past Medical History:  Diagnosis Date  . Hypertension   . Passed out     Patient Active Problem List   Diagnosis Date Noted  . Encephalopathy acute 04/28/2018  . Syncope 06/06/2016  . Essential hypertension 12/15/2015  . Heavy menstrual bleeding 12/15/2015    History reviewed. No pertinent surgical history.  Prior to Admission medications   Medication Sig Start Date End Date Taking? Authorizing Provider  amLODipine (NORVASC) 5 MG tablet Take 1 tablet (5 mg total) by mouth daily. 03/19/19   Doles-Johnson, Teah, NP  hydrochlorothiazide (HYDRODIURIL) 25 MG tablet Take 1 tablet (25 mg total) by mouth daily. 03/19/19   Doles-Johnson, Teah, NP  ibuprofen (ADVIL,MOTRIN) 800 MG tablet Take 1 tablet (800 mg total) by mouth every 8 (eight) hours as needed for moderate pain. 06/19/18   Tukov-Yual, Arlyss Gandy, NP  lisinopril (ZESTRIL) 20 MG tablet Take 1 tablet (20 mg total) by mouth at bedtime. 06/11/19   Doles-Johnson, Teah, NP    Allergies Patient has no known allergies.  Family History  Problem Relation Age of Onset  . Hypertension  Mother   . Cancer Mother   . Diabetes Mother   . Hypertension Father   . Heart attack Father   . Diabetes Father     Social History Social History   Tobacco Use  . Smoking status: Current Some Day Smoker    Years: 10.00    Types: Cigarettes  . Smokeless tobacco: Never Used  . Tobacco comment: only 1 cigarette this month   Substance Use Topics  . Alcohol use: Yes    Alcohol/week: 1.0 standard drinks    Types: 1 Cans of beer per week  . Drug use: Not Currently    Types: Other-see comments    Comment: Marijuana    Review of Systems  Constitutional: No fever/chills Eyes: No visual changes. ENT: No sore throat. Cardiovascular: Denies chest pain. Respiratory: Denies shortness of breath. Gastrointestinal: No abdominal pain.  No nausea, no vomiting.  No diarrhea.  No constipation. Genitourinary: Negative for dysuria. Musculoskeletal: Negative for back pain. Skin: Negative for rash. Neurological: Negative for headaches, focal weakness or numbness.  Positive for dizziness and lightheadedness.  ____________________________________________   PHYSICAL EXAM:  VITAL SIGNS: ED Triage Vitals  Enc Vitals Group     BP 06/16/19 0937 136/78     Pulse Rate 06/16/19 0937 69     Resp 06/16/19 0937 18     Temp 06/16/19 0937 98.6 F (37 C)     Temp Source 06/16/19 0937 Oral     SpO2 06/16/19 0937 100 %  Weight 06/16/19 0938 210 lb (95.3 kg)     Height 06/16/19 0938 5\' 6"  (1.676 m)     Head Circumference --      Peak Flow --      Pain Score 06/16/19 0938 10     Pain Loc --      Pain Edu? --      Excl. in [place]? --     Constitutional: Alert and oriented. Eyes: Conjunctivae are normal. Head: Atraumatic. Nose: No congestion/rhinnorhea. Mouth/Throat: Mucous membranes are moist. Neck: Normal ROM Cardiovascular: Normal rate, regular rhythm. Grossly normal heart sounds. Respiratory: Normal respiratory effort.  No retractions. Lungs CTAB. Gastrointestinal: Soft and nontender. No  distention. Genitourinary: deferred Musculoskeletal: No lower extremity tenderness nor edema. Neurologic:  Normal speech and language. No gross focal neurologic deficits are appreciated.  5-5 strength in bilateral upper and lower extremities, no pronator drift. Skin:  Skin is warm, dry and intact. No rash noted. Psychiatric: Mood and affect are normal. Speech and behavior are normal.  ____________________________________________   LABS (all labs ordered are listed, but only abnormal results are displayed)  Labs Reviewed  GLUCOSE, CAPILLARY - Abnormal; Notable for the following components:      Result Value   Glucose-Capillary 124 (*)    All other components within normal limits  BASIC METABOLIC PANEL - Abnormal; Notable for the following components:   Potassium 3.1 (*)    Glucose, Bld 118 (*)    BUN 21 (*)    All other components within normal limits  CBC - Abnormal; Notable for the following components:   RBC 5.30 (*)    Hemoglobin 11.9 (*)    MCV 70.9 (*)    MCH 22.5 (*)    All other components within normal limits  URINALYSIS, COMPLETE (UACMP) WITH MICROSCOPIC - Abnormal; Notable for the following components:   Color, Urine YELLOW (*)    APPearance HAZY (*)    Leukocytes,Ua SMALL (*)    All other components within normal limits  CBG MONITORING, ED   ____________________________________________  EKG  ED ECG REPORT I, Blake Divine, the attending physician, personally viewed and interpreted this ECG.   Date: 06/16/2019  EKG Time: 9:34  Rate: 71  Rhythm: normal sinus rhythm  Axis: Normal  Intervals:Incomplete RBBB  ST&T Change: None    PROCEDURES  Procedure(s) performed (including Critical Care):  Procedures   ____________________________________________   INITIAL IMPRESSION / ASSESSMENT AND PLAN / ED COURSE       53 year old female presenting with dizziness and lightheadedness since yesterday.  EKG without acute ischemic changes or evidence of  arrhythmia.  Patient has a benign and nonfocal neurologic exam, denies any feeling like the room is spinning around her on my evaluation, doubt posterior stroke.  Labs and UA unremarkable, patient reports feeling better after Zofran and IV fluid bolus.  Counseled patient to follow-up with PCP and otherwise return to the ED for new or worsening symptoms, patient agrees with plan.      ____________________________________________   FINAL CLINICAL IMPRESSION(S) / ED DIAGNOSES  Final diagnoses:  Dizziness  Hypokalemia     ED Discharge Orders    None       Note:  This document was prepared using Dragon voice recognition software and may include unintentional dictation errors.   Blake Divine, MD 06/16/19 [phone]

## 2019-06-16 NOTE — ED Triage Notes (Signed)
Pt states she just hasn't been feeling well since yesterday, has been light headed, believes due to her blood pressure, has been out of bp med for 5 days, did take lisinopril last night. Is diabetic however states, "I got nothing to check my sugar with." Denies cp, Tearful in triage.

## 2019-07-10 ENCOUNTER — Other Ambulatory Visit: Payer: Self-pay

## 2019-07-10 DIAGNOSIS — I10 Essential (primary) hypertension: Secondary | ICD-10-CM

## 2019-07-10 MED ORDER — AMLODIPINE BESYLATE 5 MG PO TABS: 5.0000 mg | ORAL_TABLET | Freq: Every day | ORAL | 1 refills | Status: DC

## 2019-07-10 MED ORDER — HYDROCHLOROTHIAZIDE 25 MG PO TABS: 25.0000 mg | ORAL_TABLET | Freq: Every day | ORAL | 1 refills | Status: DC

## 2019-08-03 ENCOUNTER — Ambulatory Visit: Payer: Disability Insurance

## 2019-08-03 NOTE — Progress Notes (Cosign Needed)
  [facility] Visit Note  Patient: Cindy Burke MRN: [id number] Date of Birth: [birth year] PCP: Patient, No Pcp Per   [name] 53 y.o. female presents for a medication therapy management visit today.  LMP  (LMP Unknown)   Patient Information   Past Medical History:  Diagnosis Date  . Hypertension   . Passed out      History reviewed. No pertinent surgical history.   Family History  Problem Relation Age of Onset  . Hypertension Mother   . Cancer Mother   . Diabetes Mother   . Hypertension Father   . Heart attack Father   . Diabetes Father    New Diagnoses (since last visit): none  Lifestyle Diet: Breakfast: boiled egg Lunch:no examples provided Dinner: no examples provided Drinks:cool aid, soda, water       Social History   Substance and Sexual Activity  Alcohol Use Not Currently  . Alcohol/week: 1.0 standard drinks  . Types: 1 Cans of beer per week    Social History   Tobacco Use  Smoking Status Current Some Day Smoker  . Years: 10.00  . Types: Cigarettes  Smokeless Tobacco Never Used  Tobacco Comment   only 1 cigarette this month     Health Maintenance  Topic Date Due  . Hepatitis C Screening  02-27-1966  . PNEUMOCOCCAL POLYSACCHARIDE VACCINE AGE 52-64 HIGH RISK  08/22/1966  . FOOT EXAM  08/22/1974  . OPHTHALMOLOGY EXAM  08/22/1974  . TETANUS/TDAP  08/23/1983  . MAMMOGRAM  08/22/2014  . COLONOSCOPY  08/22/2014  . PAP SMEAR-Modifier  01/24/2019  . HEMOGLOBIN A1C  12/05/2019  . HIV Screening  Completed   Health Maintenance/Date Completed Last ED visit: 06/16/2019 Dental Exam: no Eye Exam: no Pelvic/PAP Exam: no Mammogram: no DEXA: no Colonoscopy: no Flu Vaccine: planning to receive 2020 Pneumonia Vaccine: no  Outpatient Encounter Medications as of 08/03/2019  Medication Sig  . amLODipine (NORVASC) 5 MG tablet Take 1 tablet (5 mg total) by mouth daily.  . hydrochlorothiazide (HYDRODIURIL) 25 MG tablet Take 1  tablet (25 mg total) by mouth daily.  Marland Kitchen ibuprofen (ADVIL,MOTRIN) 800 MG tablet Take 1 tablet (800 mg total) by mouth every 8 (eight) hours as needed for moderate pain.  Marland Kitchen lisinopril (ZESTRIL) 20 MG tablet Take 1 tablet (20 mg total) by mouth at bedtime.   No facility-administered encounter medications on file as of 08/03/2019.    Assessment: -Hypertension: amlodipine, hydrochlorothiazide, lisinopril Patient stated she does check blood pressure at home. Patient did not provide specific readings. Patient stated she is taking lisinopril 20 mg take one tablet by mouth in the morning and 1 tablet by mouth at night.   -Occasional headaches: ibuprofen Patient stated she takes ibuprofen occasionally when she has a headache. Patient did not provide how often she has headaches or needs to take ibuprofen.   Plan: -Salt reduction -Exercise -Medication adherence -Clarify lisinopril dose and directions with doctor   Chaney Born, PharmD [facility]

## 2019-08-04 ENCOUNTER — Other Ambulatory Visit: Payer: Self-pay

## 2019-08-11 ENCOUNTER — Telehealth: Payer: Self-pay

## 2019-08-11 NOTE — Telephone Encounter (Signed)
Patient LVM requesting a glucose test per her lawyer. I spoke with patient and this is in regards to her disability determination. I explained to patient that we do not facilitate disability determination and she would need to contact her lawyer to see which provider they would like her to see regarding this. Patient stated she understood.

## 2019-12-09 ENCOUNTER — Other Ambulatory Visit: Payer: Self-pay

## 2019-12-09 DIAGNOSIS — I10 Essential (primary) hypertension: Secondary | ICD-10-CM

## 2019-12-09 DIAGNOSIS — R7303 Prediabetes: Secondary | ICD-10-CM

## 2019-12-10 ENCOUNTER — Other Ambulatory Visit: Payer: Self-pay

## 2019-12-10 LAB — BASIC METABOLIC PANEL
BUN/Creatinine Ratio: 29 — ABNORMAL HIGH (ref 9–23)
BUN: 23 mg/dL (ref 6–24)
CO2: 25 mmol/L (ref 20–29)
Calcium: 9.6 mg/dL (ref 8.7–10.2)
Chloride: 104 mmol/L (ref 96–106)
Creatinine, Ser: 0.79 mg/dL (ref 0.57–1.00)
GFR calc Af Amer: 97 mL/min/{1.73_m2} (ref >59)
GFR calc non Af Amer: 85 mL/min/{1.73_m2} (ref >59)
Glucose: 110 mg/dL — ABNORMAL HIGH (ref 65–99)
Potassium: 3.5 mmol/L (ref 3.5–5.2)
Sodium: 143 mmol/L (ref 134–144)

## 2019-12-10 LAB — LIPID PANEL
Chol/HDL Ratio: 4.5 ratio — ABNORMAL HIGH (ref 0.0–4.4)
Cholesterol, Total: 199 mg/dL (ref 100–199)
HDL: 44 mg/dL (ref >39)
LDL Chol Calc (NIH): 137 mg/dL — ABNORMAL HIGH (ref 0–99)
Triglycerides: 102 mg/dL (ref 0–149)
VLDL Cholesterol Cal: 18 mg/dL (ref 5–40)

## 2019-12-10 LAB — HEMOGLOBIN A1C
Est. average glucose Bld gHb Est-mCnc: 128 mg/dL
Hgb A1c MFr Bld: 6.1 % — ABNORMAL HIGH (ref 4.8–5.6)

## 2019-12-17 ENCOUNTER — Encounter: Payer: Self-pay | Admitting: Family Medicine

## 2019-12-17 ENCOUNTER — Ambulatory Visit: Payer: Self-pay | Admitting: Family Medicine

## 2019-12-17 VITALS — BP 165/89 | HR 84

## 2019-12-17 DIAGNOSIS — R7303 Prediabetes: Secondary | ICD-10-CM

## 2019-12-17 DIAGNOSIS — I10 Essential (primary) hypertension: Secondary | ICD-10-CM

## 2019-12-17 MED ORDER — AMLODIPINE BESYLATE 5 MG PO TABS: 5.0000 mg | ORAL_TABLET | Freq: Every day | ORAL | 0 refills | Status: DC

## 2019-12-17 MED ORDER — LISINOPRIL 20 MG PO TABS: 20.0000 mg | ORAL_TABLET | Freq: Every day | ORAL | 0 refills | Status: DC

## 2019-12-17 MED ORDER — HYDROCHLOROTHIAZIDE 25 MG PO TABS: 25.0000 mg | ORAL_TABLET | Freq: Every day | ORAL | 0 refills | Status: DC

## 2019-12-17 NOTE — Progress Notes (Signed)
   Virtual Visit via Telephone Note  I connected with [name] on 12/17/19 at  6:00 PM EST by telephone and verified that I am speaking with the correct person using two identifiers.   I discussed the limitations, risks, security and privacy concerns of performing an evaluation and management service by telephone and the availability of in person appointments. I also discussed with the patient that there may be a patient responsible charge related to this service. The patient expressed understanding and agreed to proceed.   History of Present Illness: Cindy Burke  has a past medical history of Hypertension and Passed out. Patient states that she has been compliant with her medications. She does report checking her BP at home and at work. Most days, she states that her BP ranges from 12/80-14/90. She does have a few occurrences of elevated BP when dealing with challenging clients at work. She denies CP, SOB, dizziness or leg swelling. She is monitoring her carbohydrate intake and tries to eat a low sodium diet. She is not exercising on a daily basis.    Observations/Objective: Patient with regular voice tone, rate and rhythm. Speaking calmly and is in no apparent distress.    Assessment and Plan: 1. Essential hypertension Continue with current medications.  - lisinopril (ZESTRIL) 20 MG tablet; Take 1 tablet (20 mg total) by mouth at bedtime.  Dispense: 90 tablet; Refill: 0 - hydrochlorothiazide (HYDRODIURIL) 25 MG tablet; Take 1 tablet (25 mg total) by mouth daily.  Dispense: 90 tablet; Refill: 0 - amLODipine (NORVASC) 5 MG tablet; Take 1 tablet (5 mg total) by mouth daily.  Dispense: 90 tablet; Refill: 0  2. Prediabetes Discussed diet and exercise. Will repeat in 3 months. If still elevated, will start metformin.   Labs ordered for 1 week prior to next appointment.  Reviewed labs with patient during this encounter.   Discussed nutrition and identified carbohydrates.   Follow  Up Instructions:  We discussed hand washing, using hand sanitizer when soap and water are not available, only going out when absolutely necessary, and social distancing.  The patient was provided an opportunity to ask questions and all were answered. The patient agreed with the plan and demonstrated an understanding of the instructions.   The patient was advised to call back or seek an in-person evaluation if the symptoms worsen or if the condition fails to improve as anticipated.  I provided 15 minutes of non-face-to-face time during this encounter.  Ms. Andr L. Nathaneil Canary, FNP-BC [facility] [address] [place], [place] [address] [phone]

## 2019-12-17 NOTE — Patient Instructions (Signed)
Health Maintenance, Female Adopting a healthy lifestyle and getting preventive care are important in promoting health and wellness. Ask your health care provider about:  The right schedule for you to have regular tests and exams.  Things you can do on your own to prevent diseases and keep yourself healthy. What should I know about diet, weight, and exercise? Eat a healthy diet   Eat a diet that includes plenty of vegetables, fruits, low-fat dairy products, and lean protein.  Do not eat a lot of foods that are high in solid fats, added sugars, or sodium. Maintain a healthy weight Body mass index (BMI) is used to identify weight problems. It estimates body fat based on height and weight. Your health care provider can help determine your BMI and help you achieve or maintain a healthy weight. Get regular exercise Get regular exercise. This is one of the most important things you can do for your health. Most adults should:  Exercise for at least 150 minutes each week. The exercise should increase your heart rate and make you sweat (moderate-intensity exercise).  Do strengthening exercises at least twice a week. This is in addition to the moderate-intensity exercise.  Spend less time sitting. Even light physical activity can be beneficial. Watch cholesterol and blood lipids Have your blood tested for lipids and cholesterol at 56 years of age, then have this test every 5 years. Have your cholesterol levels checked more often if:  Your lipid or cholesterol levels are high.  You are older than 56 years of age.  You are at high risk for heart disease. What should I know about cancer screening? Depending on your health history and family history, you may need to have cancer screening at various ages. This may include screening for:  Breast cancer.  Cervical cancer.  Colorectal cancer.  Skin cancer.  Lung cancer. What should I know about heart disease, diabetes, and high blood  pressure? Blood pressure and heart disease  High blood pressure causes heart disease and increases the risk of stroke. This is more likely to develop in people who have high blood pressure readings, are of African descent, or are overweight.  Have your blood pressure checked: ? Every 3-5 years if you are 18-39 years of age. ? Every year if you are 40 years old or older. Diabetes Have regular diabetes screenings. This checks your fasting blood sugar level. Have the screening done:  Once every three years after age 40 if you are at a normal weight and have a low risk for diabetes.  More often and at a younger age if you are overweight or have a high risk for diabetes. What should I know about preventing infection? Hepatitis B If you have a higher risk for hepatitis B, you should be screened for this virus. Talk with your health care provider to find out if you are at risk for hepatitis B infection. Hepatitis C Testing is recommended for:  Everyone born from 1945 through 1965.  Anyone with known risk factors for hepatitis C. Sexually transmitted infections (STIs)  Get screened for STIs, including gonorrhea and chlamydia, if: ? You are sexually active and are younger than 56 years of age. ? You are older than 56 years of age and your health care provider tells you that you are at risk for this type of infection. ? Your sexual activity has changed since you were last screened, and you are at increased risk for chlamydia or gonorrhea. Ask your health care provider if   you are at risk.  Ask your health care provider about whether you are at high risk for HIV. Your health care provider may recommend a prescription medicine to help prevent HIV infection. If you choose to take medicine to prevent HIV, you should first get tested for HIV. You should then be tested every 3 months for as long as you are taking the medicine. Pregnancy  If you are about to stop having your period (premenopausal) and  you may become pregnant, seek counseling before you get pregnant.  Take 400 to 800 micrograms (mcg) of folic acid every day if you become pregnant.  Ask for birth control (contraception) if you want to prevent pregnancy. Osteoporosis and menopause Osteoporosis is a disease in which the bones lose minerals and strength with aging. This can result in bone fractures. If you are 65 years old or older, or if you are at risk for osteoporosis and fractures, ask your health care provider if you should:  Be screened for bone loss.  Take a calcium or vitamin D supplement to lower your risk of fractures.  Be given hormone replacement therapy (HRT) to treat symptoms of menopause. Follow these instructions at home: Lifestyle  Do not use any products that contain nicotine or tobacco, such as cigarettes, e-cigarettes, and chewing tobacco. If you need help quitting, ask your health care provider.  Do not use street drugs.  Do not share needles.  Ask your health care provider for help if you need support or information about quitting drugs. Alcohol use  Do not drink alcohol if: ? Your health care provider tells you not to drink. ? You are pregnant, may be pregnant, or are planning to become pregnant.  If you drink alcohol: ? Limit how much you use to 0-1 drink a day. ? Limit intake if you are breastfeeding.  Be aware of how much alcohol is in your drink. In the [place] one drink equals one 12 oz bottle of beer (355 mL), one 5 oz glass of wine (148 mL), or one 1 oz glass of hard liquor (44 mL). General instructions  Schedule regular health, dental, and eye exams.  Stay current with your vaccines.  Tell your health care provider if: ? You often feel depressed. ? You have ever been abused or do not feel safe at home. Summary  Adopting a healthy lifestyle and getting preventive care are important in promoting health and wellness.  Follow your health care provider's instructions about healthy  diet, exercising, and getting tested or screened for diseases.  Follow your health care provider's instructions on monitoring your cholesterol and blood pressure. This information is not intended to replace advice given to you by your health care provider. Make sure you discuss any questions you have with your health care provider. Document Revised: 10/01/2018 Document Reviewed: 10/01/2018 Elsevier Patient Education  2020 Elsevier Inc.  

## 2020-01-01 ENCOUNTER — Ambulatory Visit: Payer: Disability Insurance | Attending: Internal Medicine

## 2020-01-01 DIAGNOSIS — Z23 Encounter for immunization: Secondary | ICD-10-CM

## 2020-01-01 NOTE — Progress Notes (Signed)
   Covid-19 Vaccination Clinic  Name:  Cindy Burke    MRN: [id number] DOB: 16-Aug-1966  01/01/2020  Ms. Bartok was observed post Covid-19 immunization for 15 minutes without incident. She was provided with Vaccine Information Sheet and instruction to access the V-Safe system.   Ms. Wey was instructed to call 911 with any severe reactions post vaccine: Marland Kitchen Difficulty breathing  . Swelling of face and throat  . A fast heartbeat  . A bad rash all over body  . Dizziness and weakness   Immunizations Administered    Name Date Dose VIS Date Route   Moderna COVID-19 Vaccine 01/01/2020 12:09 PM 0.5 mL 09/22/2019 Intramuscular   Manufacturer: Moderna   Lot: [id number]   [id number]

## 2020-02-02 ENCOUNTER — Ambulatory Visit: Payer: Disability Insurance

## 2020-02-04 ENCOUNTER — Telehealth: Payer: Self-pay | Admitting: Pharmacy Technician

## 2020-02-04 NOTE — Telephone Encounter (Signed)
Patient asking for refills for Amlodipine and Lisinopril.  Eligibility ended on 12/20/20.  Has not provided updated financial information.  Information requested 11/06/19.  Made patient aware that [facility] could not refill medications without proof of income.  Patient stated that she would obtain and bring to [facility].  [facility]

## 2020-02-22 ENCOUNTER — Telehealth: Payer: Self-pay | Admitting: Pharmacy Technician

## 2020-02-22 NOTE — Telephone Encounter (Signed)
Patient stated that she is having difficulty getting to [facility].  Mailing patient 2 [employer].  [place] [facility]

## 2020-03-10 ENCOUNTER — Telehealth: Payer: Self-pay | Admitting: Licensed Clinical Social Worker

## 2020-03-10 NOTE — Telephone Encounter (Signed)
Patient contacted the clinic on Thursday May 20th @ approximately 11:00 am to state that she has tried more than once to call in her medications at [facility] but cannot get anyone on the phone.   Clinician explained to the patient that medication management is short staffed due to two of the pharmacists being out on vacation so encouraged her to try calling them back later on this afternoon. She explained the CMA at [facility] is not in the office today to help with this issue but will be back in the coming week.

## 2020-03-11 ENCOUNTER — Telehealth: Payer: Self-pay | Admitting: Pharmacy Technician

## 2020-03-11 NOTE — Telephone Encounter (Signed)
Patient called in refills for Lisinopril and Amlodipine.  Explained to patient that [facility] is unable to fill these medications because she has not provided income information for 2021.  Patient stated that her sister was supposed to bring that information to our clinic.  I informed patient that we have not received the requested financial documentation.  Patient stated that she would contact sister and call me back.  [facility] has not received a phone call from patient.  [place] [facility]

## 2020-03-23 ENCOUNTER — Other Ambulatory Visit: Payer: Self-pay

## 2020-03-23 DIAGNOSIS — R7303 Prediabetes: Secondary | ICD-10-CM

## 2020-03-24 ENCOUNTER — Telehealth: Payer: Self-pay | Admitting: Pharmacy Technician

## 2020-03-24 LAB — HEMOGLOBIN A1C
Est. average glucose Bld gHb Est-mCnc: 117 mg/dL
Hgb A1c MFr Bld: 5.7 % — ABNORMAL HIGH (ref 4.8–5.6)

## 2020-03-24 NOTE — Telephone Encounter (Signed)
Received updated proof of income.  Patient eligible to receive medication assistance at [facility] until time for re-certification in 9359, and as long as eligibility requirements continue to be met.  [place] [facility]

## 2020-03-30 ENCOUNTER — Ambulatory Visit: Payer: Self-pay | Admitting: Gerontology

## 2020-03-31 ENCOUNTER — Telehealth: Payer: Self-pay | Admitting: Gerontology

## 2020-03-31 NOTE — Telephone Encounter (Signed)
Called to r/s a missed appt. Phone did not ring; received the "we're sorry, this call could not be completed as dialed" message

## 2020-04-06 ENCOUNTER — Telehealth: Payer: Self-pay | Admitting: General Practice

## 2020-04-06 NOTE — Telephone Encounter (Signed)
Left a VM asking patient to call back to reschedule her missed followup from last week

## 2020-04-21 ENCOUNTER — Ambulatory Visit: Payer: Self-pay | Admitting: Gerontology

## 2020-04-21 ENCOUNTER — Encounter: Payer: Self-pay | Admitting: Gerontology

## 2020-04-21 VITALS — BP 122/81 | HR 66 | Ht 66.0 in | Wt 259.0 lb

## 2020-04-21 DIAGNOSIS — M25511 Pain in right shoulder: Secondary | ICD-10-CM | POA: Insufficient documentation

## 2020-04-21 DIAGNOSIS — I10 Essential (primary) hypertension: Secondary | ICD-10-CM

## 2020-04-21 DIAGNOSIS — E785 Hyperlipidemia, unspecified: Secondary | ICD-10-CM

## 2020-04-21 DIAGNOSIS — R252 Cramp and spasm: Secondary | ICD-10-CM | POA: Insufficient documentation

## 2020-04-21 DIAGNOSIS — Z0000 Encounter for general adult medical examination without abnormal findings: Secondary | ICD-10-CM | POA: Insufficient documentation

## 2020-04-21 DIAGNOSIS — R7303 Prediabetes: Secondary | ICD-10-CM | POA: Insufficient documentation

## 2020-04-21 MED ORDER — AMLODIPINE BESYLATE 5 MG PO TABS: 5.0000 mg | ORAL_TABLET | Freq: Every day | ORAL | 0 refills | Status: DC

## 2020-04-21 MED ORDER — HYDROCHLOROTHIAZIDE 25 MG PO TABS: 25.0000 mg | ORAL_TABLET | Freq: Every day | ORAL | 0 refills | Status: DC

## 2020-04-21 MED ORDER — LISINOPRIL 20 MG PO TABS: 20.0000 mg | ORAL_TABLET | Freq: Every day | ORAL | 0 refills | Status: DC

## 2020-04-21 NOTE — Patient Instructions (Signed)
Fat and Cholesterol Restricted Eating Plan Getting too much fat and cholesterol in your diet may cause health problems. Choosing the right foods helps keep your fat and cholesterol at normal levels. This can keep you from getting certain diseases. Your doctor may recommend an eating plan that includes:  Total fat: ______% or less of total calories a day.  Saturated fat: ______% or less of total calories a day.  Cholesterol: less than _________mg a day.  Fiber: ______g a day. What are tips for following this plan? Meal planning  At meals, divide your plate into four equal parts: ? Fill one-half of your plate with vegetables and green salads. ? Fill one-fourth of your plate with whole grains. ? Fill one-fourth of your plate with low-fat (lean) protein foods.  Eat fish that is high in omega-3 fats at least two times a week. This includes mackerel, tuna, sardines, and salmon.  Eat foods that are high in fiber, such as whole grains, beans, apples, broccoli, carrots, peas, and barley. General tips   Work with your doctor to lose weight if you need to.  Avoid: ? Foods with added sugar. ? Fried foods. ? Foods with partially hydrogenated oils.  Limit alcohol intake to no more than 1 drink a day for nonpregnant women and 2 drinks a day for men. One drink equals 12 oz of beer, 5 oz of wine, or 1 oz of hard liquor. Reading food labels  Check food labels for: ? Trans fats. ? Partially hydrogenated oils. ? Saturated fat (g) in each serving. ? Cholesterol (mg) in each serving. ? Fiber (g) in each serving.  Choose foods with healthy fats, such as: ? Monounsaturated fats. ? Polyunsaturated fats. ? Omega-3 fats.  Choose grain products that have whole grains. Look for the word "whole" as the first word in the ingredient list. Cooking  Cook foods using low-fat methods. These include baking, boiling, grilling, and broiling.  Eat more home-cooked foods. Eat at restaurants and buffets  less often.  Avoid cooking using saturated fats, such as butter, cream, palm oil, palm kernel oil, and coconut oil. Recommended foods  Fruits  All fresh, canned (in natural juice), or frozen fruits. Vegetables  Fresh or frozen vegetables (raw, steamed, roasted, or grilled). Green salads. Grains  Whole grains, such as whole wheat or whole grain breads, crackers, cereals, and pasta. Unsweetened oatmeal, bulgur, barley, quinoa, or brown rice. Corn or whole wheat flour tortillas. Meats and other protein foods  Ground beef (85% or leaner), grass-fed beef, or beef trimmed of fat. Skinless chicken or turkey. Ground chicken or turkey. Pork trimmed of fat. All fish and seafood. Egg whites. Dried beans, peas, or lentils. Unsalted nuts or seeds. Unsalted canned beans. Nut butters without added sugar or oil. Dairy  Low-fat or nonfat dairy products, such as skim or 1% milk, 2% or reduced-fat cheeses, low-fat and fat-free ricotta or cottage cheese, or plain low-fat and nonfat yogurt. Fats and oils  Tub margarine without trans fats. Light or reduced-fat mayonnaise and salad dressings. Avocado. Olive, canola, sesame, or safflower oils. The items listed above may not be a complete list of foods and beverages you can eat. Contact a dietitian for more information. Foods to avoid Fruits  Canned fruit in heavy syrup. Fruit in cream or butter sauce. Fried fruit. Vegetables  Vegetables cooked in cheese, cream, or butter sauce. Fried vegetables. Grains  White bread. White pasta. White rice. Cornbread. Bagels, pastries, and croissants. Crackers and snack foods that contain trans fat   and hydrogenated oils. Meats and other protein foods  Fatty cuts of meat. Ribs, chicken wings, bacon, sausage, bologna, salami, chitterlings, fatback, hot dogs, bratwurst, and packaged lunch meats. Liver and organ meats. Whole eggs and egg yolks. Chicken and turkey with skin. Fried meat. Dairy  Whole or 2% milk, cream,  half-and-half, and cream cheese. Whole milk cheeses. Whole-fat or sweetened yogurt. Full-fat cheeses. Nondairy creamers and whipped toppings. Processed cheese, cheese spreads, and cheese curds. Beverages  Alcohol. Sugar-sweetened drinks such as sodas, lemonade, and fruit drinks. Fats and oils  Butter, stick margarine, lard, shortening, ghee, or bacon fat. Coconut, palm kernel, and palm oils. Sweets and desserts  Corn syrup, sugars, honey, and molasses. Candy. Jam and jelly. Syrup. Sweetened cereals. Cookies, pies, cakes, donuts, muffins, and ice cream. The items listed above may not be a complete list of foods and beverages you should avoid. Contact a dietitian for more information. Summary  Choosing the right foods helps keep your fat and cholesterol at normal levels. This can keep you from getting certain diseases.  At meals, fill one-half of your plate with vegetables and green salads.  Eat high-fiber foods, like whole grains, beans, apples, carrots, peas, and barley.  Limit added sugar, saturated fats, alcohol, and fried foods. This information is not intended to replace advice given to you by your health care provider. Make sure you discuss any questions you have with your health care provider. Document Revised: 06/11/2018 Document Reviewed: 06/25/2017 Elsevier Patient Education  2020 Elsevier Inc. DASH Eating Plan DASH stands for "Dietary Approaches to Stop Hypertension." The DASH eating plan is a healthy eating plan that has been shown to reduce high blood pressure (hypertension). It may also reduce your risk for type 2 diabetes, heart disease, and stroke. The DASH eating plan may also help with weight loss. What are tips for following this plan?  General guidelines  Avoid eating more than 2,300 mg (milligrams) of salt (sodium) a day. If you have hypertension, you may need to reduce your sodium intake to 1,500 mg a day.  Limit alcohol intake to no more than 1 drink a day for  nonpregnant women and 2 drinks a day for men. One drink equals 12 oz of beer, 5 oz of wine, or 1 oz of hard liquor.  Work with your health care provider to maintain a healthy body weight or to lose weight. Ask what an ideal weight is for you.  Get at least 30 minutes of exercise that causes your heart to beat faster (aerobic exercise) most days of the week. Activities may include walking, swimming, or biking.  Work with your health care provider or diet and nutrition specialist (dietitian) to adjust your eating plan to your individual calorie needs. Reading food labels   Check food labels for the amount of sodium per serving. Choose foods with less than 5 percent of the Daily Value of sodium. Generally, foods with less than 300 mg of sodium per serving fit into this eating plan.  To find whole grains, look for the word "whole" as the first word in the ingredient list. Shopping  Buy products labeled as "low-sodium" or "no salt added."  Buy fresh foods. Avoid canned foods and premade or frozen meals. Cooking  Avoid adding salt when cooking. Use salt-free seasonings or herbs instead of table salt or sea salt. Check with your health care provider or pharmacist before using salt substitutes.  Do not fry foods. Cook foods using healthy methods such as baking, boiling, grilling,   and broiling instead.  Cook with heart-healthy oils, such as olive, canola, soybean, or sunflower oil. Meal planning  Eat a balanced diet that includes: ? 5 or more servings of fruits and vegetables each day. At each meal, try to fill half of your plate with fruits and vegetables. ? Up to 6-8 servings of whole grains each day. ? Less than 6 oz of lean meat, poultry, or fish each day. A 3-oz serving of meat is about the same size as a deck of cards. One egg equals 1 oz. ? 2 servings of low-fat dairy each day. ? A serving of nuts, seeds, or beans 5 times each week. ? Heart-healthy fats. Healthy fats called Omega-3  fatty acids are found in foods such as flaxseeds and coldwater fish, like sardines, salmon, and mackerel.  Limit how much you eat of the following: ? Canned or prepackaged foods. ? Food that is high in trans fat, such as fried foods. ? Food that is high in saturated fat, such as fatty meat. ? Sweets, desserts, sugary drinks, and other foods with added sugar. ? Full-fat dairy products.  Do not salt foods before eating.  Try to eat at least 2 vegetarian meals each week.  Eat more home-cooked food and less restaurant, buffet, and fast food.  When eating at a restaurant, ask that your food be prepared with less salt or no salt, if possible. What foods are recommended? The items listed may not be a complete list. Talk with your dietitian about what dietary choices are best for you. Grains Whole-grain or whole-wheat bread. Whole-grain or whole-wheat pasta. Brown rice. Oatmeal. Quinoa. Bulgur. Whole-grain and low-sodium cereals. Pita bread. Low-fat, low-sodium crackers. Whole-wheat flour tortillas. Vegetables Fresh or frozen vegetables (raw, steamed, roasted, or grilled). Low-sodium or reduced-sodium tomato and vegetable juice. Low-sodium or reduced-sodium tomato sauce and tomato paste. Low-sodium or reduced-sodium canned vegetables. Fruits All fresh, dried, or frozen fruit. Canned fruit in natural juice (without added sugar). Meat and other protein foods Skinless chicken or turkey. Ground chicken or turkey. Pork with fat trimmed off. Fish and seafood. Egg whites. Dried beans, peas, or lentils. Unsalted nuts, nut butters, and seeds. Unsalted canned beans. Lean cuts of beef with fat trimmed off. Low-sodium, lean deli meat. Dairy Low-fat (1%) or fat-free (skim) milk. Fat-free, low-fat, or reduced-fat cheeses. Nonfat, low-sodium ricotta or cottage cheese. Low-fat or nonfat yogurt. Low-fat, low-sodium cheese. Fats and oils Soft margarine without trans fats. Vegetable oil. Low-fat, reduced-fat, or  light mayonnaise and salad dressings (reduced-sodium). Canola, safflower, olive, soybean, and sunflower oils. Avocado. Seasoning and other foods Herbs. Spices. Seasoning mixes without salt. Unsalted popcorn and pretzels. Fat-free sweets. What foods are not recommended? The items listed may not be a complete list. Talk with your dietitian about what dietary choices are best for you. Grains Baked goods made with fat, such as croissants, muffins, or some breads. Dry pasta or rice meal packs. Vegetables Creamed or fried vegetables. Vegetables in a cheese sauce. Regular canned vegetables (not low-sodium or reduced-sodium). Regular canned tomato sauce and paste (not low-sodium or reduced-sodium). Regular tomato and vegetable juice (not low-sodium or reduced-sodium). Pickles. Olives. Fruits Canned fruit in a light or heavy syrup. Fried fruit. Fruit in cream or butter sauce. Meat and other protein foods Fatty cuts of meat. Ribs. Fried meat. Bacon. Sausage. Bologna and other processed lunch meats. Salami. Fatback. Hotdogs. Bratwurst. Salted nuts and seeds. Canned beans with added salt. Canned or smoked fish. Whole eggs or egg yolks. Chicken or turkey   with skin. Dairy Whole or 2% milk, cream, and half-and-half. Whole or full-fat cream cheese. Whole-fat or sweetened yogurt. Full-fat cheese. Nondairy creamers. Whipped toppings. Processed cheese and cheese spreads. Fats and oils Butter. Stick margarine. Lard. Shortening. Ghee. Bacon fat. Tropical oils, such as coconut, palm kernel, or palm oil. Seasoning and other foods Salted popcorn and pretzels. Onion salt, garlic salt, seasoned salt, table salt, and sea salt. Worcestershire sauce. Tartar sauce. Barbecue sauce. Teriyaki sauce. Soy sauce, including reduced-sodium. Steak sauce. Canned and packaged gravies. Fish sauce. Oyster sauce. Cocktail sauce. Horseradish that you find on the shelf. Ketchup. Mustard. Meat flavorings and tenderizers. Bouillon cubes. Hot  sauce and Tabasco sauce. Premade or packaged marinades. Premade or packaged taco seasonings. Relishes. Regular salad dressings. Where to find more information:  National Heart, Lung, and Blood Institute: www.nhlbi.nih.gov  American Heart Association: www.heart.org Summary  The DASH eating plan is a healthy eating plan that has been shown to reduce high blood pressure (hypertension). It may also reduce your risk for type 2 diabetes, heart disease, and stroke.  With the DASH eating plan, you should limit salt (sodium) intake to 2,300 mg a day. If you have hypertension, you may need to reduce your sodium intake to 1,500 mg a day.  When on the DASH eating plan, aim to eat more fresh fruits and vegetables, whole grains, lean proteins, low-fat dairy, and heart-healthy fats.  Work with your health care provider or diet and nutrition specialist (dietitian) to adjust your eating plan to your individual calorie needs. This information is not intended to replace advice given to you by your health care provider. Make sure you discuss any questions you have with your health care provider. Document Revised: 09/20/2017 Document Reviewed: 10/01/2016 Elsevier Patient Education  2020 Elsevier Inc.  

## 2020-04-21 NOTE — Progress Notes (Signed)
Established Patient Office Visit  Subjective:  Patient ID: Cindy Burke, female    DOB: 07-Jan-1966  Age: 54 y.o. MRN: [id number]  CC:  Chief Complaint  Patient presents with  . Hypertension  . right shoulder pain    chronic  . cramps in feet    worsening now that she is working and on her feet all day    HPI Cindy Burke is a 54 y.o.female who presents for hypertension, and prediabetes. She reports being compliant with treatment regimen. She reports checking her blood pressure BID. Her most recent HgbA1C which was done 03/23/2020 showed a decrease from 6.1% to 5.7%. She complained of shooting pain from her right arm to her right shoulder. Pain is described as shooting,numbing pain which is  rated 10/10. She states nothing makes it better while laying on her right side makes it worse. She reports falling off a chair a while back unto her right shoulder and hearing a pop sound. She also verbalized experiencing some cramps on her right foot which has been on-going. She currently started a new job at [employer] and have been standing on her feet a lot. She also verbalized an interest in receiving the Pneumococcal vaccine, eye exam, mammogram and colonoscopy. Overall, she states that she is doing well and offer no additional complaint.     Past Medical History:  Diagnosis Date  . Hypertension   . Passed out     History reviewed. No pertinent surgical history.  Family History  Problem Relation Age of Onset  . Hypertension Mother   . Cancer Mother   . Diabetes Mother   . Hypertension Father   . Heart attack Father   . Diabetes Father     Social History   Socioeconomic History  . Marital status: Single    Spouse name: Not on file  . Number of children: Not on file  . Years of education: Not on file  . Highest education level: Not on file  Occupational History  . Not on file  Tobacco Use  . Smoking status: Current Some Day Smoker    Years: 10.00    Types: Cigarettes  .  Smokeless tobacco: Never Used  . Tobacco comment: only 1 cigarette this month   Vaping Use  . Vaping Use: Never used  Substance and Sexual Activity  . Alcohol use: Not Currently    Alcohol/week: 1.0 standard drink    Types: 1 Cans of beer per week  . Drug use: Not Currently    Types: Other-see comments    Comment: Marijuana  . Sexual activity: Not on file  Other Topics Concern  . Not on file  Social History Narrative   Sometimes has trouble getting to work, relies on other people to take the because [employer] bus does not run far enough, also has trouble falling asleep occassionally   Social Determinants of Radio broadcast assistant Strain:   . Difficulty of Paying Living Expenses:   Food Insecurity: No Food Insecurity  . Worried About Charity fundraiser in the Last Year: Never true  . Ran Out of Food in the Last Year: Never true  Transportation Needs: Unmet Transportation Needs  . Lack of Transportation (Medical): No  . Lack of Transportation (Non-Medical): Yes  Physical Activity: Insufficiently Active  . Days of Exercise per Week: 3 days  . Minutes of Exercise per Session: 30 min  Stress: Stress Concern Present  . Feeling of Stress : Very much  Social Connections: Moderately Isolated  . Frequency of Communication with Friends and Family: Three times a week  . Frequency of Social Gatherings with Friends and Family: Twice a week  . Attends Religious Services: 1 to 4 times per year  . Active Member of Clubs or Organizations: No  . Attends Archivist Meetings: Never  . Marital Status: Never married  Intimate Partner Violence: Not At Risk  . Fear of Current or Ex-Partner: No  . Emotionally Abused: No  . Physically Abused: No  . Sexually Abused: No    Outpatient Medications Prior to Visit  Medication Sig Dispense Refill  . ibuprofen (ADVIL,MOTRIN) 800 MG tablet Take 1 tablet (800 mg total) by mouth every 8 (eight) hours as needed for moderate pain. 90 tablet 1  .  amLODipine (NORVASC) 5 MG tablet Take 1 tablet (5 mg total) by mouth daily. 90 tablet 0  . hydrochlorothiazide (HYDRODIURIL) 25 MG tablet Take 1 tablet (25 mg total) by mouth daily. 90 tablet 0  . lisinopril (ZESTRIL) 20 MG tablet Take 1 tablet (20 mg total) by mouth at bedtime. 90 tablet 0   No facility-administered medications prior to visit.    No Known Allergies  ROS Review of Systems  Constitutional: Negative.   Respiratory: Negative.   Cardiovascular: Negative.   Gastrointestinal: Negative.   Genitourinary: Negative.   Musculoskeletal: Negative.   Neurological: Negative.   Psychiatric/Behavioral: Negative.       Objective:    Physical Exam Constitutional:      Appearance: Normal appearance.  HENT:     Head: Normocephalic and atraumatic.  Cardiovascular:     Rate and Rhythm: Normal rate and regular rhythm.     Pulses: Normal pulses.     Heart sounds: Normal heart sounds.  Pulmonary:     Effort: Pulmonary effort is normal.     Breath sounds: Normal breath sounds.  Neurological:     General: No focal deficit present.     Mental Status: She is alert and oriented to person, place, and time.  Psychiatric:        Mood and Affect: Mood normal.        Behavior: Behavior normal.        Thought Content: Thought content normal.        Judgment: Judgment normal.     BP 122/81 (BP Location: Left Arm, Patient Position: Sitting)   Pulse 66   Ht '5\' 6"'  (1.676 m)   Wt 259 lb (117.5 kg)   LMP  (LMP Unknown)   SpO2 97%   BMI 41.80 kg/m  Wt Readings from Last 3 Encounters:  04/21/20 259 lb (117.5 kg)  03/23/20 264 lb (119.7 kg)  06/16/19 210 lb (95.3 kg)   She lost 5 lbs. and was encouraged to continue her weight loss regimen.  Health Maintenance Due  Topic Date Due  . Hepatitis C Screening  Never done  . OPHTHALMOLOGY EXAM  Never done  . TETANUS/TDAP  Never done  . MAMMOGRAM  Never done  . COLONOSCOPY  Never done  . PAP SMEAR-Modifier  01/24/2019  . COVID-19  Vaccine (2 - Moderna 2-dose series) 01/29/2020    There are no preventive care reminders to display for this patient.  Lab Results  Component Value Date   TSH 1.490 06/04/2019   Lab Results  Component Value Date   WBC 7.5 06/16/2019   HGB 11.9 (L) 06/16/2019   HCT 37.6 06/16/2019   MCV 70.9 (L) 06/16/2019   PLT  310 06/16/2019   Lab Results  Component Value Date   NA 143 12/09/2019   K 3.5 12/09/2019   CO2 25 12/09/2019   GLUCOSE 110 (H) 12/09/2019   BUN 23 12/09/2019   CREATININE 0.79 12/09/2019   BILITOT 0.3 06/04/2019   ALKPHOS 75 06/04/2019   AST 10 06/04/2019   ALT 13 06/04/2019   PROT 7.2 06/04/2019   ALBUMIN 4.3 06/04/2019   CALCIUM 9.6 12/09/2019   ANIONGAP 11 06/16/2019   Lab Results  Component Value Date   CHOL 199 12/09/2019   Lab Results  Component Value Date   HDL 44 12/09/2019   Lab Results  Component Value Date   LDLCALC 137 (H) 12/09/2019   Lab Results  Component Value Date   TRIG 102 12/09/2019   Lab Results  Component Value Date   CHOLHDL 4.5 (H) 12/09/2019   Lab Results  Component Value Date   HGBA1C 5.7 (H) 03/23/2020      Assessment & Plan:   1. Essential hypertension Her hypertension is under control. She will continue current treatment regimen. - amLODipine (NORVASC) 5 MG tablet; Take 1 tablet (5 mg total) by mouth daily.  Dispense: 90 tablet; Refill: 0 -- hydrochlorothiazide (HYDRODIURIL) 25 MG tablet; Take 1 tablet (25 mg total) by mouth daily.  Dispense: 90 tablet; Refill: 0 - lisinopril (ZESTRIL) 20 MG tablet; Take 1 tablet (20 mg total) by mouth at bedtime.  Dispense: 90 tablet; Refill: 0 - Comp Met (CMET); Future   2. Elevated lipids -Her ASCVD 10 year risk was 5.1%. She was advise to continue on low fat/cholesterol diet and exercise as tolerated.  -Lipid panel; Future  3. Prediabetes She was encouraged to embrace lifestyle modifications and decrease her intake of concentrated sweets. - HgB A1c; Future   4.  Health care maintenance - Pneumococcal polysaccharide vaccine 23-valent greater than or equal to 2yo subcutaneous/IM was administered during visit. - Ambulatory referral to Hematology / Oncology for Mammogram and Pap smear - Ambulatory referral to Gastroenterology for Colonoscopy - Ambulatory referral to Ophthalmology  5. Right shoulder pain, unspecified chronicity She was advise to take over the counter Tylenol 650 MG  Every 12 hrs prn for pain. She was also advised to alternate warm and cold compress to her shoulder. She would be referred to Dr. Vickki Hearing   6. Cramping of feet She was advised to increase fluid intake and wear comfortable footwear.     Follow-up: Return in about 2 months (around 06/29/2020), or if symptoms worsen or fail to improve.    Carney Corners, RN

## 2020-05-03 ENCOUNTER — Ambulatory Visit: Payer: Self-pay | Admitting: Specialist

## 2020-05-04 ENCOUNTER — Telehealth (INDEPENDENT_AMBULATORY_CARE_PROVIDER_SITE_OTHER): Payer: Self-pay | Admitting: Gastroenterology

## 2020-05-04 ENCOUNTER — Other Ambulatory Visit: Payer: Self-pay

## 2020-05-04 DIAGNOSIS — Z1211 Encounter for screening for malignant neoplasm of colon: Secondary | ICD-10-CM

## 2020-05-04 MED ORDER — NA SULFATE-K SULFATE-MG SULF 17.5-3.13-1.6 GM/177ML PO SOLN: 1.0000 | Freq: Once | ORAL | 0 refills | Status: AC

## 2020-05-04 NOTE — Progress Notes (Signed)
Gastroenterology Pre-Procedure Review  Request Date: Thursday 06/09/20 Requesting Physician: Dr. Vicente Males  PATIENT REVIEW QUESTIONS: The patient responded to the following health history questions as indicated:    1. Are you having any GI issues? no 2. Do you have a personal history of Polyps? no 3. Do you have a family history of Colon Cancer or Polyps? no 4. Diabetes Mellitus? no 5. Joint replacements in the past 12 months?no 6. Major health problems in the past 3 months?no 7. Any artificial heart valves, MVP, or defibrillator?no    MEDICATIONS & ALLERGIES:    Patient reports the following regarding taking any anticoagulation/antiplatelet therapy:   Plavix, Coumadin, Eliquis, Xarelto, Lovenox, Pradaxa, Brilinta, or Effient? no Aspirin? no  Patient confirms/reports the following medications:  Current Outpatient Medications  Medication Sig Dispense Refill   amLODipine (NORVASC) 5 MG tablet Take 1 tablet (5 mg total) by mouth daily. 90 tablet 0   hydrochlorothiazide (HYDRODIURIL) 25 MG tablet Take 1 tablet (25 mg total) by mouth daily. 90 tablet 0   ibuprofen (ADVIL,MOTRIN) 800 MG tablet Take 1 tablet (800 mg total) by mouth every 8 (eight) hours as needed for moderate pain. 90 tablet 1   lisinopril (ZESTRIL) 20 MG tablet Take 1 tablet (20 mg total) by mouth at bedtime. 90 tablet 0   Na Sulfate-K Sulfate-Mg Sulf 17.5-3.13-1.6 GM/177ML SOLN Take 1 kit by mouth once for 1 dose. 354 mL 0   No current facility-administered medications for this visit.    Patient confirms/reports the following allergies:  No Known Allergies  No orders of the defined types were placed in this encounter.   AUTHORIZATION INFORMATION Primary Insurance: 1D#: Group #:  Secondary Insurance: 1D#: Group #:  SCHEDULE INFORMATION: Date: 06/09/20 Time: Location:[facility]

## 2020-06-07 ENCOUNTER — Other Ambulatory Visit: Admission: RE | Admit: 2020-06-07 | Payer: Disability Insurance | Source: Ambulatory Visit

## 2020-06-22 ENCOUNTER — Ambulatory Visit: Payer: Self-pay

## 2020-06-22 ENCOUNTER — Other Ambulatory Visit: Payer: Self-pay

## 2020-06-22 DIAGNOSIS — R7303 Prediabetes: Secondary | ICD-10-CM

## 2020-06-22 DIAGNOSIS — I10 Essential (primary) hypertension: Secondary | ICD-10-CM

## 2020-06-22 DIAGNOSIS — E785 Hyperlipidemia, unspecified: Secondary | ICD-10-CM

## 2020-06-23 LAB — LIPID PANEL
Chol/HDL Ratio: 4.2 ratio (ref 0.0–4.4)
Cholesterol, Total: 188 mg/dL (ref 100–199)
HDL: 45 mg/dL (ref >39)
LDL Chol Calc (NIH): 124 mg/dL — ABNORMAL HIGH (ref 0–99)
Triglycerides: 107 mg/dL (ref 0–149)
VLDL Cholesterol Cal: 19 mg/dL (ref 5–40)

## 2020-06-23 LAB — COMPREHENSIVE METABOLIC PANEL
ALT: 20 IU/L (ref 0–32)
AST: 17 IU/L (ref 0–40)
Albumin/Globulin Ratio: 1.7 (ref 1.2–2.2)
Albumin: 4.4 g/dL (ref 3.8–4.9)
Alkaline Phosphatase: 72 IU/L (ref 48–121)
BUN/Creatinine Ratio: 22 (ref 9–23)
BUN: 19 mg/dL (ref 6–24)
Bilirubin Total: 0.7 mg/dL (ref 0.0–1.2)
CO2: 27 mmol/L (ref 20–29)
Calcium: 9.6 mg/dL (ref 8.7–10.2)
Chloride: 104 mmol/L (ref 96–106)
Creatinine, Ser: 0.88 mg/dL (ref 0.57–1.00)
GFR calc Af Amer: 86 mL/min/{1.73_m2} (ref >59)
GFR calc non Af Amer: 74 mL/min/{1.73_m2} (ref >59)
Globulin, Total: 2.6 g/dL (ref 1.5–4.5)
Glucose: 96 mg/dL (ref 65–99)
Potassium: 3.4 mmol/L — ABNORMAL LOW (ref 3.5–5.2)
Sodium: 144 mmol/L (ref 134–144)
Total Protein: 7 g/dL (ref 6.0–8.5)

## 2020-06-23 LAB — HEMOGLOBIN A1C
Est. average glucose Bld gHb Est-mCnc: 126 mg/dL
Hgb A1c MFr Bld: 6 % — ABNORMAL HIGH (ref 4.8–5.6)

## 2020-06-29 ENCOUNTER — Ambulatory Visit: Payer: Self-pay | Admitting: Gerontology

## 2020-06-30 ENCOUNTER — Other Ambulatory Visit: Admission: RE | Admit: 2020-06-30 | Payer: Disability Insurance | Source: Ambulatory Visit

## 2020-07-04 ENCOUNTER — Inpatient Hospital Stay: Admission: RE | Admit: 2020-07-04 | Payer: Self-pay | Source: Ambulatory Visit

## 2020-07-04 ENCOUNTER — Encounter: Admission: RE | Payer: Self-pay | Source: Ambulatory Visit

## 2020-07-04 ENCOUNTER — Ambulatory Visit: Admission: RE | Admit: 2020-07-04 | Payer: Disability Insurance | Source: Ambulatory Visit | Admitting: Gastroenterology

## 2020-07-04 SURGERY — COLONOSCOPY WITH PROPOFOL
Anesthesia: General

## 2020-07-06 ENCOUNTER — Other Ambulatory Visit: Payer: Self-pay

## 2020-07-07 ENCOUNTER — Other Ambulatory Visit: Payer: Self-pay

## 2020-07-21 ENCOUNTER — Telehealth: Payer: Self-pay | Admitting: Gerontology

## 2020-07-21 NOTE — Telephone Encounter (Signed)
Called patient and administered Social Determinants of Health Screening.  She is interested in talking to someone and the clinic's social worker was recommended.  Told patient that she will receive a call back concerning an appointment with the social worker.

## 2020-07-26 NOTE — Telephone Encounter (Signed)
Called patient to schedule an appointment for her with Jerrilyn Cairo, per patient's request.  Patient will call back once she knows what her schedule looks like.

## 2020-07-28 ENCOUNTER — Ambulatory Visit: Payer: Self-pay | Admitting: Gerontology

## 2020-07-28 ENCOUNTER — Encounter: Payer: Self-pay | Admitting: Gerontology

## 2020-07-28 ENCOUNTER — Other Ambulatory Visit: Payer: Self-pay

## 2020-07-28 VITALS — BP 115/71 | HR 68 | Ht 66.0 in | Wt 248.1 lb

## 2020-07-28 DIAGNOSIS — I10 Essential (primary) hypertension: Secondary | ICD-10-CM

## 2020-07-28 DIAGNOSIS — R7303 Prediabetes: Secondary | ICD-10-CM

## 2020-07-28 DIAGNOSIS — Z0000 Encounter for general adult medical examination without abnormal findings: Secondary | ICD-10-CM

## 2020-07-28 DIAGNOSIS — E785 Hyperlipidemia, unspecified: Secondary | ICD-10-CM

## 2020-07-28 DIAGNOSIS — Z8659 Personal history of other mental and behavioral disorders: Secondary | ICD-10-CM | POA: Insufficient documentation

## 2020-07-28 MED ORDER — HYDROCHLOROTHIAZIDE 12.5 MG PO TABS: 12.5000 mg | ORAL_TABLET | Freq: Every day | ORAL | 1 refills | Status: DC

## 2020-07-28 MED ORDER — LISINOPRIL 20 MG PO TABS: 20.0000 mg | ORAL_TABLET | Freq: Every day | ORAL | 0 refills | Status: DC

## 2020-07-28 MED ORDER — AMLODIPINE BESYLATE 5 MG PO TABS: 5.0000 mg | ORAL_TABLET | Freq: Every day | ORAL | 0 refills | Status: DC

## 2020-07-28 NOTE — Patient Instructions (Signed)
Fat and Cholesterol Restricted Eating Plan Getting too much fat and cholesterol in your diet may cause health problems. Choosing the right foods helps keep your fat and cholesterol at normal levels. This can keep you from getting certain diseases. Your doctor may recommend an eating plan that includes:  Total fat: ______% or less of total calories a day.  Saturated fat: ______% or less of total calories a day.  Cholesterol: less than _________mg a day.  Fiber: ______g a day. What are tips for following this plan? Meal planning  At meals, divide your plate into four equal parts: ? Fill one-half of your plate with vegetables and green salads. ? Fill one-fourth of your plate with whole grains. ? Fill one-fourth of your plate with low-fat (lean) protein foods.  Eat fish that is high in omega-3 fats at least two times a week. This includes mackerel, tuna, sardines, and salmon.  Eat foods that are high in fiber, such as whole grains, beans, apples, broccoli, carrots, peas, and barley. General tips   Work with your doctor to lose weight if you need to.  Avoid: ? Foods with added sugar. ? Fried foods. ? Foods with partially hydrogenated oils.  Limit alcohol intake to no more than 1 drink a day for nonpregnant women and 2 drinks a day for men. One drink equals 12 oz of beer, 5 oz of wine, or 1 oz of hard liquor. Reading food labels  Check food labels for: ? Trans fats. ? Partially hydrogenated oils. ? Saturated fat (g) in each serving. ? Cholesterol (mg) in each serving. ? Fiber (g) in each serving.  Choose foods with healthy fats, such as: ? Monounsaturated fats. ? Polyunsaturated fats. ? Omega-3 fats.  Choose grain products that have whole grains. Look for the word "whole" as the first word in the ingredient list. Cooking  Cook foods using low-fat methods. These include baking, boiling, grilling, and broiling.  Eat more home-cooked foods. Eat at restaurants and buffets  less often.  Avoid cooking using saturated fats, such as butter, cream, palm oil, palm kernel oil, and coconut oil. Recommended foods  Fruits  All fresh, canned (in natural juice), or frozen fruits. Vegetables  Fresh or frozen vegetables (raw, steamed, roasted, or grilled). Green salads. Grains  Whole grains, such as whole wheat or whole grain breads, crackers, cereals, and pasta. Unsweetened oatmeal, bulgur, barley, quinoa, or brown rice. Corn or whole wheat flour tortillas. Meats and other protein foods  Ground beef (85% or leaner), grass-fed beef, or beef trimmed of fat. Skinless chicken or turkey. Ground chicken or turkey. Pork trimmed of fat. All fish and seafood. Egg whites. Dried beans, peas, or lentils. Unsalted nuts or seeds. Unsalted canned beans. Nut butters without added sugar or oil. Dairy  Low-fat or nonfat dairy products, such as skim or 1% milk, 2% or reduced-fat cheeses, low-fat and fat-free ricotta or cottage cheese, or plain low-fat and nonfat yogurt. Fats and oils  Tub margarine without trans fats. Light or reduced-fat mayonnaise and salad dressings. Avocado. Olive, canola, sesame, or safflower oils. The items listed above may not be a complete list of foods and beverages you can eat. Contact a dietitian for more information. Foods to avoid Fruits  Canned fruit in heavy syrup. Fruit in cream or butter sauce. Fried fruit. Vegetables  Vegetables cooked in cheese, cream, or butter sauce. Fried vegetables. Grains  White bread. White pasta. White rice. Cornbread. Bagels, pastries, and croissants. Crackers and snack foods that contain trans fat   and hydrogenated oils. Meats and other protein foods  Fatty cuts of meat. Ribs, chicken wings, bacon, sausage, bologna, salami, chitterlings, fatback, hot dogs, bratwurst, and packaged lunch meats. Liver and organ meats. Whole eggs and egg yolks. Chicken and turkey with skin. Fried meat. Dairy  Whole or 2% milk, cream,  half-and-half, and cream cheese. Whole milk cheeses. Whole-fat or sweetened yogurt. Full-fat cheeses. Nondairy creamers and whipped toppings. Processed cheese, cheese spreads, and cheese curds. Beverages  Alcohol. Sugar-sweetened drinks such as sodas, lemonade, and fruit drinks. Fats and oils  Butter, stick margarine, lard, shortening, ghee, or bacon fat. Coconut, palm kernel, and palm oils. Sweets and desserts  Corn syrup, sugars, honey, and molasses. Candy. Jam and jelly. Syrup. Sweetened cereals. Cookies, pies, cakes, donuts, muffins, and ice cream. The items listed above may not be a complete list of foods and beverages you should avoid. Contact a dietitian for more information. Summary  Choosing the right foods helps keep your fat and cholesterol at normal levels. This can keep you from getting certain diseases.  At meals, fill one-half of your plate with vegetables and green salads.  Eat high-fiber foods, like whole grains, beans, apples, carrots, peas, and barley.  Limit added sugar, saturated fats, alcohol, and fried foods. This information is not intended to replace advice given to you by your health care provider. Make sure you discuss any questions you have with your health care provider. Document Revised: 06/11/2018 Document Reviewed: 06/25/2017 Elsevier Patient Education  2020 [employer] DASH Eating Plan DASH stands for "Dietary Approaches to Stop Hypertension." The DASH eating plan is a healthy eating plan that has been shown to reduce high blood pressure (hypertension). It may also reduce your risk for type 2 diabetes, heart disease, and stroke. The DASH eating plan may also help with weight loss. What are tips for following this plan?  General guidelines  Avoid eating more than 2,300 mg (milligrams) of salt (sodium) a day. If you have hypertension, you may need to reduce your sodium intake to 1,500 mg a day.  Limit alcohol intake to no more than 1 drink a day for  nonpregnant women and 2 drinks a day for men. One drink equals 12 oz of beer, 5 oz of wine, or 1 oz of hard liquor.  Work with your health care provider to maintain a healthy body weight or to lose weight. Ask what an ideal weight is for you.  Get at least 30 minutes of exercise that causes your heart to beat faster (aerobic exercise) most days of the week. Activities may include walking, swimming, or biking.  Work with your health care provider or diet and nutrition specialist (dietitian) to adjust your eating plan to your individual calorie needs. Reading food labels   Check food labels for the amount of sodium per serving. Choose foods with less than 5 percent of the Daily Value of sodium. Generally, foods with less than 300 mg of sodium per serving fit into this eating plan.  To find whole grains, look for the word "whole" as the first word in the ingredient list. Shopping  Buy products labeled as "low-sodium" or "no salt added."  Buy fresh foods. Avoid canned foods and premade or frozen meals. Cooking  Avoid adding salt when cooking. Use salt-free seasonings or herbs instead of table salt or sea salt. Check with your health care provider or pharmacist before using salt substitutes.  Do not fry foods. Cook foods using healthy methods such as baking, boiling, grilling,   and broiling instead.  Cook with heart-healthy oils, such as olive, canola, soybean, or sunflower oil. Meal planning  Eat a balanced diet that includes: ? 5 or more servings of fruits and vegetables each day. At each meal, try to fill half of your plate with fruits and vegetables. ? Up to 6-8 servings of whole grains each day. ? Less than 6 oz of lean meat, poultry, or fish each day. A 3-oz serving of meat is about the same size as a deck of cards. One egg equals 1 oz. ? 2 servings of low-fat dairy each day. ? A serving of nuts, seeds, or beans 5 times each week. ? Heart-healthy fats. Healthy fats called Omega-3  fatty acids are found in foods such as flaxseeds and coldwater fish, like sardines, salmon, and mackerel.  Limit how much you eat of the following: ? Canned or prepackaged foods. ? Food that is high in trans fat, such as fried foods. ? Food that is high in saturated fat, such as fatty meat. ? Sweets, desserts, sugary drinks, and other foods with added sugar. ? Full-fat dairy products.  Do not salt foods before eating.  Try to eat at least 2 vegetarian meals each week.  Eat more home-cooked food and less restaurant, buffet, and fast food.  When eating at a restaurant, ask that your food be prepared with less salt or no salt, if possible. What foods are recommended? The items listed may not be a complete list. Talk with your dietitian about what dietary choices are best for you. Grains Whole-grain or whole-wheat bread. Whole-grain or whole-wheat pasta. Brown rice. Oatmeal. Quinoa. Bulgur. Whole-grain and low-sodium cereals. Pita bread. Low-fat, low-sodium crackers. Whole-wheat flour tortillas. Vegetables Fresh or frozen vegetables (raw, steamed, roasted, or grilled). Low-sodium or reduced-sodium tomato and vegetable juice. Low-sodium or reduced-sodium tomato sauce and tomato paste. Low-sodium or reduced-sodium canned vegetables. Fruits All fresh, dried, or frozen fruit. Canned fruit in natural juice (without added sugar). Meat and other protein foods Skinless chicken or turkey. Ground chicken or turkey. Pork with fat trimmed off. Fish and seafood. Egg whites. Dried beans, peas, or lentils. Unsalted nuts, nut butters, and seeds. Unsalted canned beans. Lean cuts of beef with fat trimmed off. Low-sodium, lean deli meat. Dairy Low-fat (1%) or fat-free (skim) milk. Fat-free, low-fat, or reduced-fat cheeses. Nonfat, low-sodium ricotta or cottage cheese. Low-fat or nonfat yogurt. Low-fat, low-sodium cheese. Fats and oils Soft margarine without trans fats. Vegetable oil. Low-fat, reduced-fat, or  light mayonnaise and salad dressings (reduced-sodium). Canola, safflower, olive, soybean, and sunflower oils. Avocado. Seasoning and other foods Herbs. Spices. Seasoning mixes without salt. Unsalted popcorn and pretzels. Fat-free sweets. What foods are not recommended? The items listed may not be a complete list. Talk with your dietitian about what dietary choices are best for you. Grains Baked goods made with fat, such as croissants, muffins, or some breads. Dry pasta or rice meal packs. Vegetables Creamed or fried vegetables. Vegetables in a cheese sauce. Regular canned vegetables (not low-sodium or reduced-sodium). Regular canned tomato sauce and paste (not low-sodium or reduced-sodium). Regular tomato and vegetable juice (not low-sodium or reduced-sodium). Pickles. Olives. Fruits Canned fruit in a light or heavy syrup. Fried fruit. Fruit in cream or butter sauce. Meat and other protein foods Fatty cuts of meat. Ribs. Fried meat. Bacon. Sausage. Bologna and other processed lunch meats. Salami. Fatback. Hotdogs. Bratwurst. Salted nuts and seeds. Canned beans with added salt. Canned or smoked fish. Whole eggs or egg yolks. Chicken or turkey   with skin. Dairy Whole or 2% milk, cream, and half-and-half. Whole or full-fat cream cheese. Whole-fat or sweetened yogurt. Full-fat cheese. Nondairy creamers. Whipped toppings. Processed cheese and cheese spreads. Fats and oils Butter. Stick margarine. Lard. Shortening. Ghee. Bacon fat. Tropical oils, such as coconut, palm kernel, or palm oil. Seasoning and other foods Salted popcorn and pretzels. Onion salt, garlic salt, seasoned salt, table salt, and sea salt. Worcestershire sauce. Tartar sauce. Barbecue sauce. Teriyaki sauce. Soy sauce, including reduced-sodium. Steak sauce. Canned and packaged gravies. Fish sauce. Oyster sauce. Cocktail sauce. Horseradish that you find on the shelf. Ketchup. Mustard. Meat flavorings and tenderizers. Bouillon cubes. Hot  sauce and Tabasco sauce. Premade or packaged marinades. Premade or packaged taco seasonings. Relishes. Regular salad dressings. Where to find more information:  National Heart, Lung, and [place]: https://wilson-eaton.com/  American Heart Association: www.heart.org Summary  The DASH eating plan is a healthy eating plan that has been shown to reduce high blood pressure (hypertension). It may also reduce your risk for type 2 diabetes, heart disease, and stroke.  With the DASH eating plan, you should limit salt (sodium) intake to 2,300 mg a day. If you have hypertension, you may need to reduce your sodium intake to 1,500 mg a day.  When on the DASH eating plan, aim to eat more fresh fruits and vegetables, whole grains, lean proteins, low-fat dairy, and heart-healthy fats.  Work with your health care provider or diet and nutrition specialist (dietitian) to adjust your eating plan to your individual calorie needs. This information is not intended to replace advice given to you by your health care provider. Make sure you discuss any questions you have with your health care provider. Document Revised: 09/20/2017 Document Reviewed: 10/01/2016 Elsevier Patient Education  2020 [place] for Diabetes Mellitus, Adult  Carbohydrate counting is a method of keeping track of how many carbohydrates you eat. Eating carbohydrates naturally increases the amount of sugar (glucose) in the blood. Counting how many carbohydrates you eat helps keep your blood glucose within normal limits, which helps you manage your diabetes (diabetes mellitus). It is important to know how many carbohydrates you can safely have in each meal. This is different for every person. A diet and nutrition specialist (registered dietitian) can help you make a meal plan and calculate how many carbohydrates you should have at each meal and snack. Carbohydrates are found in the following foods:  Grains, such as breads  and cereals.  Dried beans and soy products.  Starchy vegetables, such as potatoes, peas, and corn.  Fruit and fruit juices.  Milk and yogurt.  Sweets and snack foods, such as cake, cookies, candy, chips, and soft drinks. How do I count carbohydrates? There are two ways to count carbohydrates in food. You can use either of the methods or a combination of both. Reading "Nutrition Facts" on packaged food The "Nutrition Facts" list is included on the labels of almost all packaged foods and beverages in the [place] It includes:  The serving size.  Information about nutrients in each serving, including the grams (g) of carbohydrate per serving. To use the "Nutrition Facts":  Decide how many servings you will have.  Multiply the number of servings by the number of carbohydrates per serving.  The resulting number is the total amount of carbohydrates that you will be having. Learning standard serving sizes of other foods When you eat carbohydrate foods that are not packaged or do not include "Nutrition Facts" on the label, you need  to measure the servings in order to count the amount of carbohydrates:  Measure the foods that you will eat with a food scale or measuring cup, if needed.  Decide how many standard-size servings you will eat.  Multiply the number of servings by 15. Most carbohydrate-rich foods have about 15 g of carbohydrates per serving. ? For example, if you eat 8 oz (170 g) of strawberries, you will have eaten 2 servings and 30 g of carbohydrates (2 servings x 15 g = 30 g).  For foods that have more than one food mixed, such as soups and casseroles, you must count the carbohydrates in each food that is included. The following list contains standard serving sizes of common carbohydrate-rich foods. Each of these servings has about 15 g of carbohydrates:   hamburger bun or  English muffin.   oz (15 mL) syrup.   oz (14 g) jelly.  1 slice of bread.  1 six-inch  tortilla.  3 oz (85 g) cooked rice or pasta.  4 oz (113 g) cooked dried beans.  4 oz (113 g) starchy vegetable, such as peas, corn, or potatoes.  4 oz (113 g) hot cereal.  4 oz (113 g) mashed potatoes or  of a large baked potato.  4 oz (113 g) canned or frozen fruit.  4 oz (120 mL) fruit juice.  4-6 crackers.  6 chicken nuggets.  6 oz (170 g) unsweetened dry cereal.  6 oz (170 g) plain fat-free yogurt or yogurt sweetened with artificial sweeteners.  8 oz (240 mL) milk.  8 oz (170 g) fresh fruit or one small piece of fruit.  24 oz (680 g) popped popcorn. Example of carbohydrate counting Sample meal  3 oz (85 g) chicken breast.  6 oz (170 g) brown rice.  4 oz (113 g) corn.  8 oz (240 mL) milk.  8 oz (170 g) strawberries with sugar-free whipped topping. Carbohydrate calculation 1. Identify the foods that contain carbohydrates: ? Rice. ? Corn. ? Milk. ? Strawberries. 2. Calculate how many servings you have of each food: ? 2 servings rice. ? 1 serving corn. ? 1 serving milk. ? 1 serving strawberries. 3. Multiply each number of servings by 15 g: ? 2 servings rice x 15 g = 30 g. ? 1 serving corn x 15 g = 15 g. ? 1 serving milk x 15 g = 15 g. ? 1 serving strawberries x 15 g = 15 g. 4. Add together all of the amounts to find the total grams of carbohydrates eaten: ? 30 g + 15 g + 15 g + 15 g = 75 g of carbohydrates total. Summary  Carbohydrate counting is a method of keeping track of how many carbohydrates you eat.  Eating carbohydrates naturally increases the amount of sugar (glucose) in the blood.  Counting how many carbohydrates you eat helps keep your blood glucose within normal limits, which helps you manage your diabetes.  A diet and nutrition specialist (registered dietitian) can help you make a meal plan and calculate how many carbohydrates you should have at each meal and snack. This information is not intended to replace advice given to you by  your health care provider. Make sure you discuss any questions you have with your health care provider. Document Revised: 05/02/2017 Document Reviewed: 03/21/2016 Elsevier Patient Education  [place].

## 2020-07-28 NOTE — Progress Notes (Signed)
Established Patient Office Visit  Subjective:  Patient ID: Cindy Burke, female    DOB: Feb 05, 1966  Age: 54 y.o. MRN: [id number]  CC:  Chief Complaint  Patient presents with  . Follow-up  . Fatigue    states" sluggish"   . Depression    HPI Cindy Burke presents for hypertension, prediabetes and lab review. She states that she's compliant with her medications, continues to work on her diet and denies medication side effects. She states that she checks her blood pressure at least once daily and it's usually less than 130/80. Her Potasium decreased to 3.4 mmol/L, and she takes 25 mg of hydrochlorothiazide. She states that she's been going through a lot of stressors, denies suicidal nor homicidal ideation. Her HgbA1c done on 06/22/2020 increased from 5.7% to 6%. She states that she drinks 1 can of Sprite at work and less water intake. Her LDL decreased from 137 mg/dl to 124 mg/dl. Overall, she states that she's doing well and offers no further complaint.  Past Medical History:  Diagnosis Date  . Hypertension   . Passed out     History reviewed. No pertinent surgical history.  Family History  Problem Relation Age of Onset  . Hypertension Mother   . Cancer Mother   . Diabetes Mother   . Hypertension Father   . Heart attack Father   . Diabetes Father     Social History   Socioeconomic History  . Marital status: Single    Spouse name: Not on file  . Number of children: Not on file  . Years of education: Not on file  . Highest education level: Not on file  Occupational History  . Occupation: Surveyor, quantity: [employer]  Tobacco Use  . Smoking status: Current Some Day Smoker    Packs/day: 0.25    Years: 10.00    Pack years: 2.50    Types: Cigarettes  . Smokeless tobacco: Never Used  . Tobacco comment: 1-2 cigarettes per day  Vaping Use  . Vaping Use: Never used  Substance and Sexual Activity  . Alcohol use: Yes    Alcohol/week: 1.0 - 2.0 standard drink    Types:  1 - 2 Shots of liquor per week    Comment: drinks weekend/socially  . Drug use: Not Currently    Types: Other-see comments, Marijuana  . Sexual activity: Not on file  Other Topics Concern  . Not on file  Social History Narrative   Sometimes has trouble getting to work, relies on other people to take the because [employer] bus does not run far enough, also has trouble falling asleep occassionally   Social Determinants of Health   Financial Resource Strain: High Risk  . Difficulty of Paying Living Expenses: Very hard  Food Insecurity: No Food Insecurity  . Worried About Charity fundraiser in the Last Year: Never true  . Ran Out of Food in the Last Year: Never true  Transportation Needs: Unmet Transportation Needs  . Lack of Transportation (Medical): Yes  . Lack of Transportation (Non-Medical): Yes  Physical Activity: Insufficiently Active  . Days of Exercise per Week: 3 days  . Minutes of Exercise per Session: 30 min  Stress: Stress Concern Present  . Feeling of Stress : Very much  Social Connections: Socially Isolated  . Frequency of Communication with Friends and Family: Twice a week  . Frequency of Social Gatherings with Friends and Family: Once a week  . Attends Religious Services: Never  .  Active Member of Clubs or Organizations: No  . Attends Archivist Meetings: Never  . Marital Status: Never married  Intimate Partner Violence:   . Fear of Current or Ex-Partner: Not on file  . Emotionally Abused: Not on file  . Physically Abused: Not on file  . Sexually Abused: Not on file    Outpatient Medications Prior to Visit  Medication Sig Dispense Refill  . ibuprofen (ADVIL,MOTRIN) 800 MG tablet Take 1 tablet (800 mg total) by mouth every 8 (eight) hours as needed for moderate pain. 90 tablet 1  . lisinopril (ZESTRIL) 20 MG tablet Take 1 tablet (20 mg total) by mouth at bedtime. 90 tablet 0  . amLODipine (NORVASC) 5 MG tablet Take 1 tablet (5 mg total) by mouth daily. 90  tablet 0  . hydrochlorothiazide (HYDRODIURIL) 25 MG tablet Take 1 tablet (25 mg total) by mouth daily. 90 tablet 0   No facility-administered medications prior to visit.    No Known Allergies  ROS Review of Systems  Constitutional: Negative.   Respiratory: Negative.   Cardiovascular: Negative.   Genitourinary: Negative.   Neurological: Negative.   Psychiatric/Behavioral: Positive for dysphoric mood.      Objective:    Physical Exam HENT:     Head: Normocephalic and atraumatic.  Cardiovascular:     Rate and Rhythm: Normal rate and regular rhythm.     Pulses: Normal pulses.     Heart sounds: Normal heart sounds.  Pulmonary:     Effort: Pulmonary effort is normal.     Breath sounds: Normal breath sounds.  Skin:    General: Skin is warm and dry.  Neurological:     General: No focal deficit present.     Mental Status: She is alert and oriented to person, place, and time. Mental status is at baseline.  Psychiatric:        Mood and Affect: Mood normal.        Behavior: Behavior normal.        Thought Content: Thought content normal.        Judgment: Judgment normal.     BP 115/71 (BP Location: Left Arm, Patient Position: Sitting)   Pulse 68   Ht 5\' 6"  (1.676 m)   Wt 248 lb 1.6 oz (112.5 kg)   LMP  (LMP Unknown)   SpO2 95%   BMI 40.04 kg/m  Wt Readings from Last 3 Encounters:  07/28/20 248 lb 1.6 oz (112.5 kg)  04/21/20 259 lb (117.5 kg)  03/23/20 264 lb (119.7 kg)   She lost 11 pounds in 3 months and was encouraged to continue on her weight loss regimen.  Health Maintenance Due  Topic Date Due  . Hepatitis C Screening  Never done  . OPHTHALMOLOGY EXAM  Never done  . TETANUS/TDAP  Never done  . MAMMOGRAM  Never done  . COLONOSCOPY  Never done  . PAP SMEAR-Modifier  01/24/2019  . COVID-19 Vaccine (2 - Moderna 2-dose series) 01/29/2020    There are no preventive care reminders to display for this patient.  Lab Results  Component Value Date   TSH 1.490  06/04/2019   Lab Results  Component Value Date   WBC 7.5 06/16/2019   HGB 11.9 (L) 06/16/2019   HCT 37.6 06/16/2019   MCV 70.9 (L) 06/16/2019   PLT 310 06/16/2019   Lab Results  Component Value Date   NA 144 06/22/2020   K 3.4 (L) 06/22/2020   CO2 27 06/22/2020   GLUCOSE  96 06/22/2020   BUN 19 06/22/2020   CREATININE 0.88 06/22/2020   BILITOT 0.7 06/22/2020   ALKPHOS 72 06/22/2020   AST 17 06/22/2020   ALT 20 06/22/2020   PROT 7.0 06/22/2020   ALBUMIN 4.4 06/22/2020   CALCIUM 9.6 06/22/2020   ANIONGAP 11 06/16/2019   Lab Results  Component Value Date   CHOL 188 06/22/2020   Lab Results  Component Value Date   HDL 45 06/22/2020   Lab Results  Component Value Date   LDLCALC 124 (H) 06/22/2020   Lab Results  Component Value Date   TRIG 107 06/22/2020   Lab Results  Component Value Date   CHOLHDL 4.2 06/22/2020   Lab Results  Component Value Date   HGBA1C 6.0 (H) 06/22/2020      Assessment & Plan:   1. Essential hypertension -Her blood pressure is under control, her Hydrochlorothiazide was decreased to 12.5 mg. She will continue to check her blood glucose, DASH diet and exercise as tolerated. - hydrochlorothiazide (HYDRODIURIL) 12.5 MG tablet; Take 1 tablet (12.5 mg total) by mouth daily.  Dispense: 30 tablet; Refill: 1 - lisinopril (ZESTRIL) 20 MG tablet; Take 1 tablet (20 mg total) by mouth at bedtime.  Dispense: 90 tablet; Refill: 0 - amLODipine (NORVASC) 5 MG tablet; Take 1 tablet (5 mg total) by mouth daily.  Dispense: 90 tablet; Refill: 0  2. Elevated lipids - Her ASCVD was 7.6% and she declines statin therapy. She was advised to continue on low fat low cholesterol diet and exercise as tolerated.  3. Prediabetes - Her HgbA1c was 6%, she declines Metformin therapy, and was advised to continue on low carb/non concentrated sweet diet and exercise as tolerated. - HgB A1c; Future  4. Health care maintenance -She was advised to schedule appointment for  Mammogram and Pap smear. - Ambulatory referral to Hematology / Oncology  5. History of anxiety - She will follow up with Behavioral health, was provided with Crisis Help line for worsening anxiety.    Follow-up: Return in about 2 months (around 09/28/2020), or if symptoms worsen or fail to improve.    Ward Boissonneault Jerold Coombe, NP

## 2020-08-03 ENCOUNTER — Telehealth: Payer: Self-pay | Admitting: Licensed Clinical Social Worker

## 2020-08-03 NOTE — Telephone Encounter (Signed)
Talked to patient and rescheduled her appointment with Jerrilyn Cairo to October 26 instead of October 20 due to clinician not being available.

## 2020-08-08 ENCOUNTER — Ambulatory Visit: Payer: Disability Insurance | Admitting: Pharmacist

## 2020-08-08 DIAGNOSIS — Z79899 Other long term (current) drug therapy: Secondary | ICD-10-CM

## 2020-08-08 NOTE — Progress Notes (Signed)
Medication Management Clinic Visit Note  Patient: Cindy Burke MRN: [id number] Date of Birth: 01/14/1966 PCP: Langston Reusing, NP   [name] 54 y.o. female presents for a telephone medication therapy visit with the student pharmacist. Patient reports no concerns about medications.  Patient Information   Past Medical History:  Diagnosis Date  . Hypertension   . Passed out      History reviewed. No pertinent surgical history.   Family History  Problem Relation Age of Onset  . Hypertension Mother   . Cancer Mother   . Diabetes Mother   . Hypertension Father   . Heart attack Father   . Diabetes Father   . Sickle cell anemia Son     New Diagnoses (since last visit): None  Family Support: Good  Lifestyle/Exercises: Patient reports cleaning and doing work around the house. She is about to move to a new place and has been busy getting prepared  Diet: Breakfast: Fruit, toast, eggs, water Lunch: Sandwich (lettuce, tomato, meats) Dinner: Chicken, pinto beans, potato salad Drinks: Water, sometimes soft drinks Snacks: Small bag of chips at work        Social History   Substance and Sexual Activity  Alcohol Use Yes  . Alcohol/week: 1.0 - 2.0 standard drink  . Types: 1 - 2 Shots of liquor per week   Comment: drinks weekend/socially    Social History   Tobacco Use  Smoking Status Current Some Day Smoker  . Packs/day: 0.25  . Years: 10.00  . Pack years: 2.50  . Types: Cigarettes  Smokeless Tobacco Never Used  Tobacco Comment   1-2 cigarettes per day     Health Maintenance  Topic Date Due  . Hepatitis C Screening  Never done  . OPHTHALMOLOGY EXAM  Never done  . TETANUS/TDAP  Never done  . MAMMOGRAM  Never done  . COLONOSCOPY  Never done  . PAP SMEAR-Modifier  01/24/2019  . COVID-19 Vaccine (2 - Moderna 2-dose series) 01/29/2020  . HEMOGLOBIN A1C  12/20/2020  . FOOT EXAM  04/21/2021  . PNEUMOCOCCAL POLYSACCHARIDE VACCINE AGE 48-64 HIGH RISK   Completed  . HIV Screening  Completed   Health Maintenance/Date Completed  Last ED visit: No Last Visit to PCP: 07/28/20 Next Visit to PCP: Yes; December 2021 Specialist Visit: No Dental Exam: No; interested Eye Exam: No; interested Pelvic/PAP Exam: No; interested Mammogram: No; interested Colonoscopy: No; interested Flu Vaccine: Yes, per records Pneumonia Vaccine: Yes, per records COVID-19 Vaccine:Yes, only got the first dose. She said she had a bad reaction and didn't want the second one Shingrix Vaccine: No  Outpatient Encounter Medications as of 08/08/2020  Medication Sig Note  . amLODipine (NORVASC) 5 MG tablet Take 1 tablet (5 mg total) by mouth daily.   . hydrochlorothiazide (HYDRODIURIL) 12.5 MG tablet Take 1 tablet (12.5 mg total) by mouth daily.   Marland Kitchen ibuprofen (ADVIL,MOTRIN) 800 MG tablet Take 1 tablet (800 mg total) by mouth every 8 (eight) hours as needed for moderate pain. 08/08/2020: Patient takes one a day  . lisinopril (ZESTRIL) 20 MG tablet Take 1 tablet (20 mg total) by mouth at bedtime.    No facility-administered encounter medications on file as of 08/08/2020.    Assessment and Plan:  Hypertension: amlodipine, lisinopril, HCTZ Patient reports no concerns about medications. She says she is compliant and has not felt dizzy. Patient endorses taking HCTZ 12.5 mg, as the change was noted in her last PCP visit.  Plan: Follow up on  BP and continue medications  Mathis Fare, [place] [facility]

## 2020-08-10 ENCOUNTER — Institutional Professional Consult (permissible substitution): Payer: Self-pay | Admitting: Licensed Clinical Social Worker

## 2020-08-10 ENCOUNTER — Other Ambulatory Visit: Payer: Self-pay

## 2020-08-11 ENCOUNTER — Telehealth: Payer: Self-pay | Admitting: Licensed Clinical Social Worker

## 2020-08-11 NOTE — Telephone Encounter (Signed)
Left message for patient reminding her of her [place] with Jerrilyn Cairo on Tuesday, October 26 at 11 am and left clinic phone number, as well.

## 2020-08-16 ENCOUNTER — Institutional Professional Consult (permissible substitution): Payer: Self-pay | Admitting: Licensed Clinical Social Worker

## 2020-08-18 ENCOUNTER — Telehealth: Payer: Self-pay | Admitting: Licensed Clinical Social Worker

## 2020-08-18 NOTE — Telephone Encounter (Signed)
Called patient and rescheduled her appointment on a full hour slot and not a half hour slot with [facility].

## 2020-08-29 ENCOUNTER — Other Ambulatory Visit: Payer: Self-pay | Admitting: Gerontology

## 2020-08-29 DIAGNOSIS — I10 Essential (primary) hypertension: Secondary | ICD-10-CM

## 2020-08-30 ENCOUNTER — Telehealth: Payer: Self-pay

## 2020-08-30 ENCOUNTER — Other Ambulatory Visit: Payer: Self-pay | Admitting: Gerontology

## 2020-08-30 NOTE — Telephone Encounter (Signed)
Social work Theatre manager called for patient to see if she wanted to change her appointment with Jerrilyn Cairo from November 10th to today at noon.  Patient was not able to and confirmed that her appointment tomorrow at 9 am works fine.

## 2020-08-31 ENCOUNTER — Ambulatory Visit: Payer: Self-pay | Admitting: Licensed Clinical Social Worker

## 2020-08-31 ENCOUNTER — Institutional Professional Consult (permissible substitution): Payer: Self-pay | Admitting: Licensed Clinical Social Worker

## 2020-08-31 ENCOUNTER — Other Ambulatory Visit: Payer: Self-pay

## 2020-08-31 DIAGNOSIS — F4322 Adjustment disorder with anxiety: Secondary | ICD-10-CM

## 2020-08-31 NOTE — Progress Notes (Signed)
This encounter was created in error - please disregard.

## 2020-08-31 NOTE — Addendum Note (Signed)
Addended by: Lesli Albee on: 08/31/2020 09:19 AM   Modules accepted: Level of Service, SmartSet

## 2020-08-31 NOTE — BH Specialist Note (Addendum)
Integrated Behavioral Health Follow Up Visit  MRN: [id number] Name: Cindy Burke   Type of Service: [place] Interpretor:No. Interpretor Name and Language:   SUBJECTIVE: Cindy Burke is a 54 y.o. female accompanied by herself Patient was referred by Carlyon Shadow for mental health Patient reports the following symptoms/concerns: The patient stated that she just woke up however, she agreed to continue with the mental health assessment this morning. She noted that she now lives alone in her apartment.The patient reports that she is currently working at [employer] as a Scientist, water quality and does not want to discuss her job. She notes that she has two grown children and five grandchildren. She noted that she has to go see them sometimes because they have no transportation. She stated she has a friend who she can count on for support. She stated that "everything is the same" since her last assessment on 03/24/2019 with Julian Hy, LCSW. She stated, " You are really getting on my nerves." The patient denied any symptoms of depression. She stated that she feels nervous daily but denied any other anxiety symptoms.  She denies any history or current substance alcohol use. The patient denied suicidal or homicidal thoughts. Duration of problem: ; Severity of problem: moderate  OBJECTIVE: Mood: Irritable and Affect: Blunt Risk of harm to self or others: No plan to harm self or others  LIFE CONTEXT: Family and Social: see above School/Work: see above Self-Care: see above Life Changes: see above  GOALS ADDRESSED: Patient will: 1.  Reduce symptoms of: agitation  2.  Increase knowledge and/or ability of: coping skills and healthy habits  3.  Demonstrate ability to: Increase healthy adjustment to current life circumstances  INTERVENTIONS: Interventions utilized:  Psychoeducation and/or Health Education was utilized by the clinician during today's  follow-up session. The clinician possessed with the patient today's observations were contradictory of each other; reporting not being agitated or irritable while telling the clinician "you are getting on my nerves." The clinician explained that although the patient did not want therapy it may be useful to revisit this further in the future. Clinician provided the patient with contact information and instructions on how to request another appointment should she change her mind. The clinician measured the patient's anxiety and depression on a numerical scale.  Standardized Assessments completed: GAD-7 and PHQ 9  GAD-7   3 PHQ-9   1  ASSESSMENT: Patient currently experiencing see above   Patient may benefit from see above  PLAN: 1. Follow up with behavioral health clinician on : patient refused 2. Behavioral recommendations:  3. Referral(s): patient refused  4. "From scale of 1-10, how likely are you to follow plan?":   Lesli Albee, Student-Social Work

## 2020-09-21 ENCOUNTER — Other Ambulatory Visit: Payer: Self-pay

## 2020-09-21 ENCOUNTER — Telehealth: Payer: Self-pay | Admitting: Gerontology

## 2020-09-21 NOTE — Telephone Encounter (Signed)
Pt missed lab appt on 12/1. Her labs and fu with Benjamine Mola need to be reschedule. Left her a message. MD 12/1 @ 2:25

## 2020-09-22 ENCOUNTER — Telehealth: Payer: Self-pay | Admitting: Gerontology

## 2020-09-27 ENCOUNTER — Ambulatory Visit: Payer: Self-pay | Admitting: Gerontology

## 2020-11-09 ENCOUNTER — Other Ambulatory Visit: Payer: Self-pay

## 2020-11-16 ENCOUNTER — Other Ambulatory Visit: Payer: Self-pay

## 2020-11-17 ENCOUNTER — Ambulatory Visit: Payer: Self-pay | Admitting: Gerontology

## 2020-11-23 ENCOUNTER — Telehealth: Payer: Self-pay | Admitting: Gerontology

## 2020-11-23 NOTE — Telephone Encounter (Signed)
Called patient to notify her of appointment on 2/10 at 9:30am.

## 2020-12-01 ENCOUNTER — Encounter: Payer: Self-pay | Admitting: Gerontology

## 2020-12-01 ENCOUNTER — Ambulatory Visit: Payer: Self-pay | Admitting: Gerontology

## 2020-12-01 ENCOUNTER — Other Ambulatory Visit: Payer: Self-pay

## 2020-12-01 ENCOUNTER — Other Ambulatory Visit: Payer: Self-pay | Admitting: Gerontology

## 2020-12-01 VITALS — BP 144/86 | HR 69 | Temp 97.0°F | Resp 16 | Wt 245.1 lb

## 2020-12-01 DIAGNOSIS — R7303 Prediabetes: Secondary | ICD-10-CM

## 2020-12-01 DIAGNOSIS — I10 Essential (primary) hypertension: Secondary | ICD-10-CM

## 2020-12-01 DIAGNOSIS — E785 Hyperlipidemia, unspecified: Secondary | ICD-10-CM

## 2020-12-01 DIAGNOSIS — Z0000 Encounter for general adult medical examination without abnormal findings: Secondary | ICD-10-CM

## 2020-12-01 LAB — GLUCOSE, POCT (MANUAL RESULT ENTRY): POC Glucose: 88 mg/dl (ref 70–99)

## 2020-12-01 LAB — POCT GLYCOSYLATED HEMOGLOBIN (HGB A1C)
HbA1c POC (<> result, manual entry): 5.9 % (ref 4.0–5.6)
HbA1c, POC (controlled diabetic range): 0 % (ref 0.0–7.0)
HbA1c, POC (prediabetic range): 0 % — AB (ref 5.7–6.4)
Hemoglobin A1C: 5.9 % — AB (ref 4.0–5.6)

## 2020-12-01 MED ORDER — METFORMIN HCL 1000 MG PO TABS: 500.0000 mg | ORAL_TABLET | Freq: Every day | ORAL | 0 refills | Status: DC

## 2020-12-01 MED ORDER — HYDROCHLOROTHIAZIDE 12.5 MG PO TABS: 12.5000 mg | ORAL_TABLET | Freq: Every day | ORAL | 2 refills | Status: DC

## 2020-12-01 MED ORDER — LISINOPRIL 20 MG PO TABS
20.0000 mg | ORAL_TABLET | Freq: Every day | ORAL | 0 refills | Status: DC
Start: 2020-12-01 — End: 2020-12-01

## 2020-12-01 MED ORDER — AMLODIPINE BESYLATE 5 MG PO TABS: 5.0000 mg | ORAL_TABLET | Freq: Every day | ORAL | 0 refills | Status: DC

## 2020-12-01 NOTE — Progress Notes (Signed)
Established Patient Office Visit  Subjective:  Patient ID: Cindy Burke, female    DOB: Nov 12, 1965  Age: 55 y.o. MRN: [id number]  CC: No chief complaint on file.   HPI Cindy Burke is a 55 year old female who presents for follow up of HTN and prediabetes. She reports compliance with her current medication regimen. Her blood pressure has been running 150's over 80's. She previously experienced a syncopal episode and was having dizzy spells so her hctz was decreased. She has not had any more episodes after the medication change. She denies any swelling in her legs or chest pain.   Her FSBG was 88 fasting and her POC A1C is 5.9%. She denies any lightheadedness, palpitations, or peripheral numbness or tingling. She does report polyuria and polydipsia. She does not currently check her blood sugars at home and is not on any medication. She does perform daily foot exams and denies any lesions or sores. Overall, she feels well and has no complaints or concerns today.   Past Medical History:  Diagnosis Date  . Hypertension   . Passed out     No past surgical history on file.  Family History  Problem Relation Age of Onset  . Hypertension Mother   . Cancer Mother   . Diabetes Mother   . Hypertension Father   . Heart attack Father   . Diabetes Father   . Sickle cell anemia Son     Social History   Socioeconomic History  . Marital status: Single    Spouse name: Not on file  . Number of children: Not on file  . Years of education: Not on file  . Highest education level: Not on file  Occupational History  . Occupation: Surveyor, quantity: [employer]  Tobacco Use  . Smoking status: Current Some Day Smoker    Packs/day: 0.25    Years: 10.00    Pack years: 2.50    Types: Cigarettes  . Smokeless tobacco: Never Used  . Tobacco comment: 1-2 cigarettes per day  Vaping Use  . Vaping Use: Never used  Substance and Sexual Activity  . Alcohol use: Yes    Alcohol/week: 1.0 - 2.0  standard drink    Types: 1 - 2 Shots of liquor per week    Comment: drinks weekend/socially  . Drug use: Not Currently    Types: Other-see comments, Marijuana  . Sexual activity: Not on file  Other Topics Concern  . Not on file  Social History Narrative   Sometimes has trouble getting to work, relies on other people to take the because [employer] bus does not run far enough, also has trouble falling asleep occassionally   Social Determinants of Health   Financial Resource Strain: High Risk  . Difficulty of Paying Living Expenses: Very hard  Food Insecurity: No Food Insecurity  . Worried About Charity fundraiser in the Last Year: Never true  . Ran Out of Food in the Last Year: Never true  Transportation Needs: Unmet Transportation Needs  . Lack of Transportation (Medical): Yes  . Lack of Transportation (Non-Medical): Yes  Physical Activity: Not on file  Stress: Stress Concern Present  . Feeling of Stress : Very much  Social Connections: Socially Isolated  . Frequency of Communication with Friends and Family: Twice a week  . Frequency of Social Gatherings with Friends and Family: Once a week  . Attends Religious Services: Never  . Active Member of Clubs or Organizations: No  .  Attends Archivist Meetings: Never  . Marital Status: Never married  Intimate Partner Violence: Not on file    Outpatient Medications Prior to Visit  Medication Sig Dispense Refill  . amLODipine (NORVASC) 5 MG tablet TAKE ONE TABLET BY MOUTH EVERY DAY 90 tablet 0  . hydrochlorothiazide (HYDRODIURIL) 12.5 MG tablet Take 1 tablet (12.5 mg total) by mouth daily. 30 tablet 1  . ibuprofen (ADVIL,MOTRIN) 800 MG tablet Take 1 tablet (800 mg total) by mouth every 8 (eight) hours as needed for moderate pain. 90 tablet 1  . lisinopril (ZESTRIL) 20 MG tablet Take 1 tablet (20 mg total) by mouth at bedtime. 90 tablet 0   No facility-administered medications prior to visit.    No Known Allergies  ROS Review  of Systems  Constitutional: Negative.   Eyes: Negative.   Respiratory: Negative.   Cardiovascular: Negative.   Gastrointestinal: Negative.   Endocrine: Positive for polydipsia and polyuria.  Musculoskeletal: Negative.   Skin: Negative.   Neurological: Negative.   Psychiatric/Behavioral: Negative.       Objective:    Physical Exam Constitutional:      Appearance: She is obese.  Cardiovascular:     Rate and Rhythm: Normal rate and regular rhythm.     Pulses: Normal pulses.     Heart sounds: Normal heart sounds.  Pulmonary:     Effort: Pulmonary effort is normal.     Breath sounds: Normal breath sounds.  Abdominal:     General: Bowel sounds are normal.     Palpations: Abdomen is soft.  Feet:     Right foot:     Skin integrity: Skin integrity normal.     Toenail Condition: Right toenails are normal.     Left foot:     Skin integrity: Skin integrity normal.     Toenail Condition: Left toenails are normal.  Skin:    General: Skin is warm and dry.  Neurological:     Mental Status: She is alert and oriented to person, place, and time.  Psychiatric:        Mood and Affect: Mood normal.        Behavior: Behavior normal.     BP (!) 144/86 (BP Location: Left Arm, Patient Position: Sitting, Cuff Size: Large)   Pulse 69   Temp (!) 97 F (36.1 C)   Resp 16   Wt 245 lb 1.6 oz (111.2 kg)   LMP  (LMP Unknown)   SpO2 99%   BMI 39.56 kg/m  Wt Readings from Last 3 Encounters:  12/01/20 245 lb 1.6 oz (111.2 kg)  07/28/20 248 lb 1.6 oz (112.5 kg)  04/21/20 259 lb (117.5 kg)     Health Maintenance Due  Topic Date Due  . Hepatitis C Screening  Never done  . OPHTHALMOLOGY EXAM  Never done  . TETANUS/TDAP  Never done  . COLONOSCOPY (Pts 45-62yrs Insurance coverage will need to be confirmed)  Never done  . MAMMOGRAM  Never done  . PAP SMEAR-Modifier  01/24/2019  . COVID-19 Vaccine (2 - Moderna 3-dose series) 01/29/2020    There are no preventive care reminders to display  for this patient.  Lab Results  Component Value Date   TSH 1.490 06/04/2019   Lab Results  Component Value Date   WBC 7.5 06/16/2019   HGB 11.9 (L) 06/16/2019   HCT 37.6 06/16/2019   MCV 70.9 (L) 06/16/2019   PLT 310 06/16/2019   Lab Results  Component Value Date  NA 144 06/22/2020   K 3.4 (L) 06/22/2020   CO2 27 06/22/2020   GLUCOSE 96 06/22/2020   BUN 19 06/22/2020   CREATININE 0.88 06/22/2020   BILITOT 0.7 06/22/2020   ALKPHOS 72 06/22/2020   AST 17 06/22/2020   ALT 20 06/22/2020   PROT 7.0 06/22/2020   ALBUMIN 4.4 06/22/2020   CALCIUM 9.6 06/22/2020   ANIONGAP 11 06/16/2019   Lab Results  Component Value Date   CHOL 188 06/22/2020   Lab Results  Component Value Date   HDL 45 06/22/2020   Lab Results  Component Value Date   LDLCALC 124 (H) 06/22/2020   Lab Results  Component Value Date   TRIG 107 06/22/2020   Lab Results  Component Value Date   CHOLHDL 4.2 06/22/2020   Lab Results  Component Value Date   HGBA1C 6.0 (H) 06/22/2020      Assessment & Plan:   1. Prediabetes POC A1C 5.9% Initiated metformin therapy for the prevention of DM Discussed diet modifications and carb counting Encouraged weight loss and exercise - POCT HgB A1C; Future - POCT Glucose (CBG) - POCT HgB A1C - metFORMIN (GLUCOPHAGE) 1000 MG tablet; Take 0.5 tablets (500 mg total) by mouth daily with breakfast.  Dispense: 30 tablet; Refill: 0 - Ambulatory referral to Ophthalmology  2. Elevated lipids Previous LDL elevated - pt had declined statin therapy.  Pt is willing to initiate statin therapy at this time if indicated. Will determine further treatment plan after labs obtained.  Eat a well-balanced diet and avoid greasy and fried foods - Lipid Profile; Future  3. Healthcare maintenance Mammogram, pap smear, and colonoscopy screening ordered Foot exam completed Referral to optho for eye exam - Ambulatory referral to Hematology - Ambulatory referral to  Gastroenterology  4. Essential hypertension Discussed goal <140/90 - keep a log at home and bring to your next visit DASH diet recommended - amLODipine (NORVASC) 5 MG tablet; Take 1 tablet (5 mg total) by mouth daily.  Dispense: 90 tablet; Refill: 0 - hydrochlorothiazide (HYDRODIURIL) 12.5 MG tablet; Take 1 tablet (12.5 mg total) by mouth daily.  Dispense: 30 tablet; Refill: 2 - lisinopril (ZESTRIL) 20 MG tablet; Take 1 tablet (20 mg total) by mouth at bedtime.  Dispense: 90 tablet; Refill: 0    Follow-up: Return in one month (12/29/20), or return if symptoms worsen or do not improve.     Clayton Bibles, RN, BSN, FNP-S

## 2020-12-01 NOTE — Patient Instructions (Signed)
https://www.diabeteseducator.org/docs/default-source/living-with-diabetes/conquering-the-grocery-store-v1.pdf?sfvrsn=4">  Carbohydrate Counting for Diabetes Mellitus, Adult Carbohydrate counting is a method of keeping track of how many carbohydrates you eat. Eating carbohydrates naturally increases the amount of sugar (glucose) in the blood. Counting how many carbohydrates you eat improves your blood glucose control, which helps you manage your diabetes. It is important to know how many carbohydrates you can safely have in each meal. This is different for every person. A dietitian can help you make a meal plan and calculate how many carbohydrates you should have at each meal and snack. What foods contain carbohydrates? Carbohydrates are found in the following foods:  Grains, such as breads and cereals.  Dried beans and soy products.  Starchy vegetables, such as potatoes, peas, and corn.  Fruit and fruit juices.  Milk and yogurt.  Sweets and snack foods, such as cake, cookies, candy, chips, and soft drinks.   How do I count carbohydrates in foods? There are two ways to count carbohydrates in food. You can read food labels or learn standard serving sizes of foods. You can use either of the methods or a combination of both. Using the Nutrition Facts label The Nutrition Facts list is included on the labels of almost all packaged foods and beverages in the [place] It includes:  The serving size.  Information about nutrients in each serving, including the grams (g) of carbohydrate per serving. To use the Nutrition Facts:  Decide how many servings you will have.  Multiply the number of servings by the number of carbohydrates per serving.  The resulting number is the total amount of carbohydrates that you will be having. Learning the standard serving sizes of foods When you eat carbohydrate foods that are not packaged or do not include Nutrition Facts on the label, you need to measure the  servings in order to count the amount of carbohydrates.  Measure the foods that you will eat with a food scale or measuring cup, if needed.  Decide how many standard-size servings you will eat.  Multiply the number of servings by 15. For foods that contain carbohydrates, one serving equals 15 g of carbohydrates. ? For example, if you eat 2 cups or 10 oz (300 g) of strawberries, you will have eaten 2 servings and 30 g of carbohydrates (2 servings x 15 g = 30 g).  For foods that have more than one food mixed, such as soups and casseroles, you must count the carbohydrates in each food that is included. The following list contains standard serving sizes of common carbohydrate-rich foods. Each of these servings has about 15 g of carbohydrates:  1 slice of bread.  1 six-inch (15 cm) tortilla.  ? cup or 2 oz (53 g) cooked rice or pasta.   cup or 3 oz (85 g) cooked or canned, drained and rinsed beans or lentils.   cup or 3 oz (85 g) starchy vegetable, such as peas, corn, or squash.   cup or 4 oz (120 g) hot cereal.   cup or 3 oz (85 g) boiled or mashed potatoes, or  or 3 oz (85 g) of a large baked potato.   cup or 4 fl oz (118 mL) fruit juice.  1 cup or 8 fl oz (237 mL) milk.  1 small or 4 oz (106 g) apple.   or 2 oz (63 g) of a medium banana.  1 cup or 5 oz (150 g) strawberries.  3 cups or 1 oz (24 g) popped popcorn. What is an example of   carbohydrate counting? To calculate the number of carbohydrates in this sample meal, follow the steps shown below. Sample meal  3 oz (85 g) chicken breast.  ? cup or 4 oz (106 g) brown rice.   cup or 3 oz (85 g) corn.  1 cup or 8 fl oz (237 mL) milk.  1 cup or 5 oz (150 g) strawberries with sugar-free whipped topping. Carbohydrate calculation 1. Identify the foods that contain carbohydrates: ? Rice. ? Corn. ? Milk. ? Strawberries. 2. Calculate how many servings you have of each food: ? 2 servings rice. ? 1 serving  corn. ? 1 serving milk. ? 1 serving strawberries. 3. Multiply each number of servings by 15 g: ? 2 servings rice x 15 g = 30 g. ? 1 serving corn x 15 g = 15 g. ? 1 serving milk x 15 g = 15 g. ? 1 serving strawberries x 15 g = 15 g. 4. Add together all of the amounts to find the total grams of carbohydrates eaten: ? 30 g + 15 g + 15 g + 15 g = 75 g of carbohydrates total. What are tips for following this plan? Shopping  Develop a meal plan and then make a shopping list.  Buy fresh and frozen vegetables, fresh and frozen fruit, dairy, eggs, beans, lentils, and whole grains.  Look at food labels. Choose foods that have more fiber and less sugar.  Avoid processed foods and foods with added sugars. Meal planning  Aim to have the same amount of carbohydrates at each meal and for each snack time.  Plan to have regular, balanced meals and snacks. Where to find more information  American Diabetes Association: www.diabetes.org  Centers for Disease Control and Prevention: www.cdc.gov Summary  Carbohydrate counting is a method of keeping track of how many carbohydrates you eat.  Eating carbohydrates naturally increases the amount of sugar (glucose) in the blood.  Counting how many carbohydrates you eat improves your blood glucose control, which helps you manage your diabetes.  A dietitian can help you make a meal plan and calculate how many carbohydrates you should have at each meal and snack. This information is not intended to replace advice given to you by your health care provider. Make sure you discuss any questions you have with your health care provider. Document Revised: 10/08/2019 Document Reviewed: 10/09/2019 Elsevier Patient Education  2021 Elsevier Inc. https://www.nhlbi.nih.gov/files/docs/public/heart/dash_brief.pdf">  DASH Eating Plan DASH stands for Dietary Approaches to Stop Hypertension. The DASH eating plan is a healthy eating plan that has been shown to:  Reduce  high blood pressure (hypertension).  Reduce your risk for type 2 diabetes, heart disease, and stroke.  Help with weight loss. What are tips for following this plan? Reading food labels  Check food labels for the amount of salt (sodium) per serving. Choose foods with less than 5 percent of the Daily Value of sodium. Generally, foods with less than 300 milligrams (mg) of sodium per serving fit into this eating plan.  To find whole grains, look for the word "whole" as the first word in the ingredient list. Shopping  Buy products labeled as "low-sodium" or "no salt added."  Buy fresh foods. Avoid canned foods and pre-made or frozen meals. Cooking  Avoid adding salt when cooking. Use salt-free seasonings or herbs instead of table salt or sea salt. Check with your health care provider or pharmacist before using salt substitutes.  Do not fry foods. Cook foods using healthy methods such as baking, boiling,   grilling, roasting, and broiling instead.  Cook with heart-healthy oils, such as olive, canola, avocado, soybean, or sunflower oil. Meal planning  Eat a balanced diet that includes: ? 4 or more servings of fruits and 4 or more servings of vegetables each day. Try to fill one-half of your plate with fruits and vegetables. ? 6-8 servings of whole grains each day. ? Less than 6 oz (170 g) of lean meat, poultry, or fish each day. A 3-oz (85-g) serving of meat is about the same size as a deck of cards. One egg equals 1 oz (28 g). ? 2-3 servings of low-fat dairy each day. One serving is 1 cup (237 mL). ? 1 serving of nuts, seeds, or beans 5 times each week. ? 2-3 servings of heart-healthy fats. Healthy fats called omega-3 fatty acids are found in foods such as walnuts, flaxseeds, fortified milks, and eggs. These fats are also found in cold-water fish, such as sardines, salmon, and mackerel.  Limit how much you eat of: ? Canned or prepackaged foods. ? Food that is high in trans fat, such as  some fried foods. ? Food that is high in saturated fat, such as fatty meat. ? Desserts and other sweets, sugary drinks, and other foods with added sugar. ? Full-fat dairy products.  Do not salt foods before eating.  Do not eat more than 4 egg yolks a week.  Try to eat at least 2 vegetarian meals a week.  Eat more home-cooked food and less restaurant, buffet, and fast food.   Lifestyle  When eating at a restaurant, ask that your food be prepared with less salt or no salt, if possible.  If you drink alcohol: ? Limit how much you use to:  0-1 drink a day for women who are not pregnant.  0-2 drinks a day for men. ? Be aware of how much alcohol is in your drink. In the [place] one drink equals one 12 oz bottle of beer (355 mL), one 5 oz glass of wine (148 mL), or one 1 oz glass of hard liquor (44 mL). General information  Avoid eating more than 2,300 mg of salt a day. If you have hypertension, you may need to reduce your sodium intake to 1,500 mg a day.  Work with your health care provider to maintain a healthy body weight or to lose weight. Ask what an ideal weight is for you.  Get at least 30 minutes of exercise that causes your heart to beat faster (aerobic exercise) most days of the week. Activities may include walking, swimming, or biking.  Work with your health care provider or dietitian to adjust your eating plan to your individual calorie needs. What foods should I eat? Fruits All fresh, dried, or frozen fruit. Canned fruit in natural juice (without added sugar). Vegetables Fresh or frozen vegetables (raw, steamed, roasted, or grilled). Low-sodium or reduced-sodium tomato and vegetable juice. Low-sodium or reduced-sodium tomato sauce and tomato paste. Low-sodium or reduced-sodium canned vegetables. Grains Whole-grain or whole-wheat bread. Whole-grain or whole-wheat pasta. Brown rice. Oatmeal. Quinoa. Bulgur. Whole-grain and low-sodium cereals. Pita bread. Low-fat, low-sodium  crackers. Whole-wheat flour tortillas. Meats and other proteins Skinless chicken or turkey. Ground chicken or turkey. Pork with fat trimmed off. Fish and seafood. Egg whites. Dried beans, peas, or lentils. Unsalted nuts, nut butters, and seeds. Unsalted canned beans. Lean cuts of beef with fat trimmed off. Low-sodium, lean precooked or cured meat, such as sausages or meat loaves. Dairy Low-fat (1%) or fat-free (skim)   milk. Reduced-fat, low-fat, or fat-free cheeses. Nonfat, low-sodium ricotta or cottage cheese. Low-fat or nonfat yogurt. Low-fat, low-sodium cheese. Fats and oils Soft margarine without trans fats. Vegetable oil. Reduced-fat, low-fat, or light mayonnaise and salad dressings (reduced-sodium). Canola, safflower, olive, avocado, soybean, and sunflower oils. Avocado. Seasonings and condiments Herbs. Spices. Seasoning mixes without salt. Other foods Unsalted popcorn and pretzels. Fat-free sweets. The items listed above may not be a complete list of foods and beverages you can eat. Contact a dietitian for more information. What foods should I avoid? Fruits Canned fruit in a light or heavy syrup. Fried fruit. Fruit in cream or butter sauce. Vegetables Creamed or fried vegetables. Vegetables in a cheese sauce. Regular canned vegetables (not low-sodium or reduced-sodium). Regular canned tomato sauce and paste (not low-sodium or reduced-sodium). Regular tomato and vegetable juice (not low-sodium or reduced-sodium). Pickles. Olives. Grains Baked goods made with fat, such as croissants, muffins, or some breads. Dry pasta or rice meal packs. Meats and other proteins Fatty cuts of meat. Ribs. Fried meat. Bacon. Bologna, salami, and other precooked or cured meats, such as sausages or meat loaves. Fat from the back of a pig (fatback). Bratwurst. Salted nuts and seeds. Canned beans with added salt. Canned or smoked fish. Whole eggs or egg yolks. Chicken or turkey with skin. Dairy Whole or 2% milk,  cream, and half-and-half. Whole or full-fat cream cheese. Whole-fat or sweetened yogurt. Full-fat cheese. Nondairy creamers. Whipped toppings. Processed cheese and cheese spreads. Fats and oils Butter. Stick margarine. Lard. Shortening. Ghee. Bacon fat. Tropical oils, such as coconut, palm kernel, or palm oil. Seasonings and condiments Onion salt, garlic salt, seasoned salt, table salt, and sea salt. Worcestershire sauce. Tartar sauce. Barbecue sauce. Teriyaki sauce. Soy sauce, including reduced-sodium. Steak sauce. Canned and packaged gravies. Fish sauce. Oyster sauce. Cocktail sauce. Store-bought horseradish. Ketchup. Mustard. Meat flavorings and tenderizers. Bouillon cubes. Hot sauces. Pre-made or packaged marinades. Pre-made or packaged taco seasonings. Relishes. Regular salad dressings. Other foods Salted popcorn and pretzels. The items listed above may not be a complete list of foods and beverages you should avoid. Contact a dietitian for more information. Where to find more information  National Heart, Lung, and Blood Institute: www.nhlbi.nih.gov  American Heart Association: www.heart.org  Academy of Nutrition and Dietetics: www.eatright.org  National Kidney Foundation: www.kidney.org Summary  The DASH eating plan is a healthy eating plan that has been shown to reduce high blood pressure (hypertension). It may also reduce your risk for type 2 diabetes, heart disease, and stroke.  When on the DASH eating plan, aim to eat more fresh fruits and vegetables, whole grains, lean proteins, low-fat dairy, and heart-healthy fats.  With the DASH eating plan, you should limit salt (sodium) intake to 2,300 mg a day. If you have hypertension, you may need to reduce your sodium intake to 1,500 mg a day.  Work with your health care provider or dietitian to adjust your eating plan to your individual calorie needs. This information is not intended to replace advice given to you by your health care  provider. Make sure you discuss any questions you have with your health care provider. Document Revised: 09/11/2019 Document Reviewed: 09/11/2019 Elsevier Patient Education  2021 Elsevier Inc.  

## 2020-12-07 ENCOUNTER — Other Ambulatory Visit: Payer: Self-pay

## 2020-12-28 ENCOUNTER — Ambulatory Visit: Payer: Disability Insurance | Attending: Oncology

## 2020-12-29 ENCOUNTER — Ambulatory Visit: Payer: Self-pay | Admitting: Gerontology

## 2021-01-10 ENCOUNTER — Telehealth: Payer: Self-pay | Admitting: Pharmacy Technician

## 2021-01-10 NOTE — Telephone Encounter (Signed)
Patient failed to provide 2022 proof of income.  No additional medication assistance will be provided by [facility] without the required proof of income documentation.  Patient notified by letter.  [place], [place]  [id number]    January 09, 2021    Cindy Burke [place], [place]  [id number]  Dear Cindy Burke:  This is to inform you that you are no longer eligible to receive medication assistance at [facility].  The reason(s) are:    _____Your total gross monthly household income exceeds 250% of the Federal Poverty Level.   _____Tangible assets (savings, checking, stocks/bonds, pension, retirement, etc.) exceeds our limit  _____You are eligible to receive benefits from [facility], [facility] or HIV Medication              Assistance Program _____You are eligible to receive benefits from a Medicare Part "D" plan _____You have prescription insurance  _____You are not an [facility] resident __X__Failure to provide all requested proof of income information for 2022.    Medication assistance will resume once all requested financial information has been returned to our clinic.  If you have questions, please contact our clinic at [phone].    Thank you,  [facility]

## 2021-03-01 ENCOUNTER — Other Ambulatory Visit: Payer: Self-pay | Admitting: Gerontology

## 2021-03-01 ENCOUNTER — Other Ambulatory Visit: Payer: Self-pay

## 2021-03-01 MED FILL — Hydrochlorothiazide Cap 12.5 MG: ORAL | 14 days supply | Qty: 14 | Fill #0 | Status: AC

## 2021-03-01 MED FILL — Metformin HCl Tab 500 MG: ORAL | 14 days supply | Qty: 14 | Fill #0 | Status: AC

## 2021-03-01 MED FILL — Amlodipine Besylate Tab 5 MG (Base Equivalent): ORAL | 14 days supply | Qty: 14 | Fill #0 | Status: AC

## 2021-03-01 MED FILL — Lisinopril Tab 20 MG: ORAL | 14 days supply | Qty: 14 | Fill #0 | Status: AC

## 2021-03-02 ENCOUNTER — Other Ambulatory Visit: Payer: Self-pay

## 2021-03-03 ENCOUNTER — Other Ambulatory Visit: Payer: Self-pay

## 2021-03-03 MED ORDER — METFORMIN HCL 500 MG PO TABS
ORAL_TABLET | ORAL | 0 refills | Status: DC
  Filled 2021-03-03: qty 60, fill #0

## 2021-03-06 ENCOUNTER — Other Ambulatory Visit: Payer: Self-pay

## 2021-03-10 ENCOUNTER — Other Ambulatory Visit: Payer: Self-pay

## 2021-03-15 ENCOUNTER — Ambulatory Visit: Payer: Self-pay | Admitting: Gerontology

## 2021-03-16 ENCOUNTER — Other Ambulatory Visit: Payer: Self-pay

## 2021-03-16 ENCOUNTER — Ambulatory Visit: Payer: Self-pay | Admitting: Gerontology

## 2021-03-16 VITALS — BP 154/87 | HR 82 | Temp 97.1°F | Resp 16 | Ht 65.0 in | Wt 240.9 lb

## 2021-03-16 DIAGNOSIS — I10 Essential (primary) hypertension: Secondary | ICD-10-CM

## 2021-03-16 DIAGNOSIS — R7303 Prediabetes: Secondary | ICD-10-CM

## 2021-03-16 DIAGNOSIS — E785 Hyperlipidemia, unspecified: Secondary | ICD-10-CM

## 2021-03-16 DIAGNOSIS — Z87898 Personal history of other specified conditions: Secondary | ICD-10-CM

## 2021-03-16 DIAGNOSIS — Z0000 Encounter for general adult medical examination without abnormal findings: Secondary | ICD-10-CM

## 2021-03-16 MED ORDER — CHLORTHALIDONE 25 MG PO TABS
25.0000 mg | ORAL_TABLET | Freq: Every day | ORAL | 0 refills | Status: DC
  Filled 2021-03-16: qty 30, 30d supply, fill #0

## 2021-03-16 MED ORDER — AMLODIPINE BESYLATE 10 MG PO TABS
10.0000 mg | ORAL_TABLET | Freq: Every day | ORAL | 0 refills | Status: DC
  Filled 2021-03-16: qty 30, 30d supply, fill #0

## 2021-03-16 MED ORDER — METFORMIN HCL 500 MG PO TABS
ORAL_TABLET | ORAL | 0 refills | Status: DC
  Filled 2021-03-16: qty 30, 30d supply, fill #0

## 2021-03-16 MED ORDER — LISINOPRIL 20 MG PO TABS
ORAL_TABLET | Freq: Every day | ORAL | 0 refills | Status: DC
Start: 2021-03-16 — End: 2021-06-28
  Filled 2021-03-16: qty 30, 30d supply, fill #0

## 2021-03-16 NOTE — Patient Instructions (Signed)

## 2021-03-16 NOTE — Progress Notes (Signed)
Established Patient Office Visit  Subjective:  Patient ID: Cindy Burke, female    DOB: May 14, 1966  Age: 55 y.o. MRN: [id number]  CC:  Chief Complaint  Patient presents with  . Follow-up  . Hypertension    HPI Cindy Burke is a 56 y/o female who has history of Hypertension, Prediabetes, presents for routine follow up. Her blood pressure was elevated, she doesn't check it at home, but compliant with her medications and adheres to DASH diet. Her HgbA1c done on 12/01/20 was 5.9%, she states that she adheres to her medications. She also states that she has apnea, wakes up gasping for air sometimes, sleeps with her mouth open and snores.  Overall, she states that she's doing well and offers no further complaint.  Past Medical History:  Diagnosis Date  . Hypertension   . Passed out   . Pre-diabetes     Past Surgical History:  Procedure Laterality Date  . NO PAST SURGERIES      Family History  Problem Relation Age of Onset  . Hypertension Mother   . Cancer Mother   . Diabetes Mother   . Hypertension Father   . Heart attack Father   . Diabetes Father   . Sickle cell anemia Son   . Stroke Brother     Social History   Socioeconomic History  . Marital status: Single    Spouse name: Not on file  . Number of children: Not on file  . Years of education: Not on file  . Highest education level: Not on file  Occupational History  . Occupation: Surveyor, quantity: [employer]  Tobacco Use  . Smoking status: Current Some Day Smoker    Packs/day: 0.00    Years: 10.00    Pack years: 0.00    Types: Cigarettes  . Smokeless tobacco: Never Used  . Tobacco comment: 2 cigarettes a day  Vaping Use  . Vaping Use: Never used  Substance and Sexual Activity  . Alcohol use: Yes    Alcohol/week: 1.0 - 2.0 standard drink    Types: 1 - 2 Shots of liquor per week    Comment: drinks weekend/socially  . Drug use: Not Currently    Types: Marijuana    Comment: last use 2021  . Sexual  activity: Not on file  Other Topics Concern  . Not on file  Social History Narrative   Sometimes has trouble getting to work, relies on other people to take the because [employer] bus does not run far enough, also has trouble falling asleep occassionally   Social Determinants of Health   Financial Resource Strain: High Risk  . Difficulty of Paying Living Expenses: Very hard  Food Insecurity: No Food Insecurity  . Worried About Charity fundraiser in the Last Year: Never true  . Ran Out of Food in the Last Year: Never true  Transportation Needs: Unmet Transportation Needs  . Lack of Transportation (Medical): Yes  . Lack of Transportation (Non-Medical): Yes  Physical Activity: Not on file  Stress: Stress Concern Present  . Feeling of Stress : Very much  Social Connections: Socially Isolated  . Frequency of Communication with Friends and Family: Twice a week  . Frequency of Social Gatherings with Friends and Family: Once a week  . Attends Religious Services: Never  . Active Member of Clubs or Organizations: No  . Attends Archivist Meetings: Never  . Marital Status: Never married  Intimate Partner Violence: Not on file  Outpatient Medications Prior to Visit  Medication Sig Dispense Refill  . ibuprofen (ADVIL,MOTRIN) 800 MG tablet Take 1 tablet (800 mg total) by mouth every 8 (eight) hours as needed for moderate pain. 90 tablet 1  . amLODipine (NORVASC) 5 MG tablet TAKE ONE TABLET BY MOUTH EVERY DAY 90 tablet 0  . hydrochlorothiazide (MICROZIDE) 12.5 MG capsule TAKE ONE CAPSULE BY MOUTH EVERY DAY 30 capsule 2  . lisinopril (ZESTRIL) 20 MG tablet TAKE ONE TABLET BY MOUTH AT BEDTIME 90 tablet 0  . metFORMIN (GLUCOPHAGE) 500 MG tablet TAKE ONE TABLET BY MOUTH EVERY DAY WITH BREAKFAST 60 tablet 0  . hydrochlorothiazide (HYDRODIURIL) 12.5 MG tablet Take 1 tablet (12.5 mg total) by mouth daily. 30 tablet 2   No facility-administered medications prior to visit.    No Known  Allergies  ROS Review of Systems  Constitutional: Negative.   Eyes: Negative.   Respiratory: Negative.   Endocrine: Negative.   Skin: Negative.   Neurological: Negative.   Psychiatric/Behavioral: Negative.       Objective:    Physical Exam HENT:     Head: Normocephalic.  Eyes:     Extraocular Movements: Extraocular movements intact.     Conjunctiva/sclera: Conjunctivae normal.     Pupils: Pupils are equal, round, and reactive to light.  Cardiovascular:     Rate and Rhythm: Normal rate and regular rhythm.     Pulses: Normal pulses.     Heart sounds: Normal heart sounds.  Pulmonary:     Effort: Pulmonary effort is normal.     Breath sounds: Normal breath sounds.  Skin:    General: Skin is warm.  Neurological:     General: No focal deficit present.     Mental Status: She is alert and oriented to person, place, and time. Mental status is at baseline.  Psychiatric:        Mood and Affect: Mood normal.        Behavior: Behavior normal.        Thought Content: Thought content normal.        Judgment: Judgment normal.     BP (!) 154/87 (BP Location: Right Arm, Patient Position: Sitting, Cuff Size: Large)   Pulse 82   Temp (!) 97.1 F (36.2 C)   Resp 16   Ht _0  (1.651 m)   Wt 240 lb 14.4 oz (109.3 kg)   LMP  (LMP Unknown)   SpO2 98%   BMI 40.09 kg/m  Wt Readings from Last 3 Encounters:  03/16/21 240 lb 14.4 oz (109.3 kg)  12/01/20 245 lb 1.6 oz (111.2 kg)  07/28/20 248 lb 1.6 oz (112.5 kg)   Se lost 5 pounds, encourage weight loss  Health Maintenance Due  Topic Date Due  . OPHTHALMOLOGY EXAM  Never done  . Hepatitis C Screening  Never done  . TETANUS/TDAP  Never done  . COLONOSCOPY (Pts 45-70yr Insurance coverage will need to be confirmed)  Never done  . MAMMOGRAM  Never done  . Zoster Vaccines- Shingrix (1 of 2) Never done  . PAP SMEAR-Modifier  01/24/2019  . COVID-19 Vaccine (2 - Moderna 3-dose series) 01/29/2020    There are no preventive care  reminders to display for this patient.  Lab Results  Component Value Date   TSH 1.490 06/04/2019   Lab Results  Component Value Date   WBC 7.5 06/16/2019   HGB 11.9 (L) 06/16/2019   HCT 37.6 06/16/2019   MCV 70.9 (L) 06/16/2019   PLT  310 06/16/2019   Lab Results  Component Value Date   NA 144 06/22/2020   K 3.4 (L) 06/22/2020   CO2 27 06/22/2020   GLUCOSE 96 06/22/2020   BUN 19 06/22/2020   CREATININE 0.88 06/22/2020   BILITOT 0.7 06/22/2020   ALKPHOS 72 06/22/2020   AST 17 06/22/2020   ALT 20 06/22/2020   PROT 7.0 06/22/2020   ALBUMIN 4.4 06/22/2020   CALCIUM 9.6 06/22/2020   ANIONGAP 11 06/16/2019   Lab Results  Component Value Date   CHOL 188 06/22/2020   Lab Results  Component Value Date   HDL 45 06/22/2020   Lab Results  Component Value Date   LDLCALC 124 (H) 06/22/2020   Lab Results  Component Value Date   TRIG 107 06/22/2020   Lab Results  Component Value Date   CHOLHDL 4.2 06/22/2020   Lab Results  Component Value Date   HGBA1C 5.9 (A) 12/01/2020   HGBA1C 5.9 12/01/2020   HGBA1C 0 (A) 12/01/2020   HGBA1C 0.0 12/01/2020      Assessment & Plan:   1. Essential hypertension - Her blood pressure is not under control, her goal should be less than 140/90. Her Amlodipine was increased to 10 mg daily, and HCTZ was changed to 25 mg Chlorthalidone. She was advised to check, record blood pressure and bring log to follow up appointment. She will follow up with Dr Waldemar Dickens for Ophthalmology exam. - Ambulatory referral to Ophthalmology - chlorthalidone (HYGROTON) 25 MG tablet; Take 1 tablet (25 mg total) by mouth daily.  Dispense: 30 tablet; Refill: 0 - lisinopril (ZESTRIL) 20 MG tablet; TAKE ONE TABLET BY MOUTH AT BEDTIME  Dispense: 90 tablet; Refill: 0 - amLODipine (NORVASC) 10 MG tablet; Take 1 tablet (10 mg total) by mouth daily.  Dispense: 90 tablet; Refill: 0 - Comp Met (CMET); Future - Comp Met (CMET)  2. Health care maintenance - She was provided  with [facility] flyer to schedule Mammogram and Pap smear.  3. Prediabetes - She will continue on Metformin, low carb/non concentrated sweet diet, and exercise as tolerated. - metFORMIN (GLUCOPHAGE) 500 MG tablet; TAKE ONE TABLET BY MOUTH EVERY DAY WITH BREAKFAST  Dispense: 90 tablet; Refill: 0  4. History of snoring - She was advised to complete [facility] financial application for - PSG Sleep Study; Future  5. Elevated lipids - She missed her lipid profile lab appointment, but will collect. - Lipid Profile    Follow-up: Return in about 4 weeks (around 04/13/2021), or if symptoms worsen or fail to improve.    Demisha Nokes Jerold Coombe, NP

## 2021-03-17 ENCOUNTER — Other Ambulatory Visit: Payer: Self-pay

## 2021-03-17 LAB — LIPID PANEL
Chol/HDL Ratio: 4 ratio (ref 0.0–4.4)
Cholesterol, Total: 206 mg/dL — ABNORMAL HIGH (ref 100–199)
HDL: 51 mg/dL (ref >39)
LDL Chol Calc (NIH): 122 mg/dL — ABNORMAL HIGH (ref 0–99)
Triglycerides: 188 mg/dL — ABNORMAL HIGH (ref 0–149)
VLDL Cholesterol Cal: 33 mg/dL (ref 5–40)

## 2021-03-17 LAB — COMPREHENSIVE METABOLIC PANEL
ALT: 20 IU/L (ref 0–32)
AST: 14 IU/L (ref 0–40)
Albumin/Globulin Ratio: 1.6 (ref 1.2–2.2)
Albumin: 4.6 g/dL (ref 3.8–4.9)
Alkaline Phosphatase: 76 IU/L (ref 44–121)
BUN/Creatinine Ratio: 25 — ABNORMAL HIGH (ref 9–23)
BUN: 28 mg/dL — ABNORMAL HIGH (ref 6–24)
Bilirubin Total: 0.5 mg/dL (ref 0.0–1.2)
CO2: 25 mmol/L (ref 20–29)
Calcium: 9.9 mg/dL (ref 8.7–10.2)
Chloride: 102 mmol/L (ref 96–106)
Creatinine, Ser: 1.11 mg/dL — ABNORMAL HIGH (ref 0.57–1.00)
Globulin, Total: 2.9 g/dL (ref 1.5–4.5)
Glucose: 89 mg/dL (ref 65–99)
Potassium: 3.7 mmol/L (ref 3.5–5.2)
Sodium: 144 mmol/L (ref 134–144)
Total Protein: 7.5 g/dL (ref 6.0–8.5)
eGFR: 58 mL/min/{1.73_m2} — ABNORMAL LOW (ref >59)

## 2021-03-23 ENCOUNTER — Other Ambulatory Visit: Payer: Self-pay

## 2021-03-29 ENCOUNTER — Telehealth: Payer: Self-pay | Admitting: Pharmacist

## 2021-03-29 NOTE — Telephone Encounter (Signed)
Patient approved for medication assistance at [facility] until 12/20/21, as long as eligibility criteria continues to be met.   Vonda Henderson [facility] Administrative Assistant 

## 2021-03-30 ENCOUNTER — Other Ambulatory Visit: Payer: Self-pay

## 2021-04-13 ENCOUNTER — Ambulatory Visit: Payer: Self-pay | Admitting: Gerontology

## 2021-04-13 ENCOUNTER — Encounter: Payer: Self-pay | Admitting: Gerontology

## 2021-04-13 ENCOUNTER — Other Ambulatory Visit: Payer: Self-pay

## 2021-04-13 VITALS — BP 135/83 | HR 91 | Temp 96.8°F | Resp 16 | Wt 239.7 lb

## 2021-04-13 DIAGNOSIS — E785 Hyperlipidemia, unspecified: Secondary | ICD-10-CM

## 2021-04-13 DIAGNOSIS — Z0000 Encounter for general adult medical examination without abnormal findings: Secondary | ICD-10-CM

## 2021-04-13 DIAGNOSIS — I10 Essential (primary) hypertension: Secondary | ICD-10-CM

## 2021-04-13 MED ORDER — CHLORTHALIDONE 25 MG PO TABS
25.0000 mg | ORAL_TABLET | Freq: Every day | ORAL | 0 refills | Status: DC
  Filled 2021-04-13: qty 90, 90d supply, fill #0

## 2021-04-13 MED ORDER — ATORVASTATIN CALCIUM 10 MG PO TABS
10.0000 mg | ORAL_TABLET | Freq: Every day | ORAL | 0 refills | Status: DC
  Filled 2021-04-13: qty 30, 30d supply, fill #0

## 2021-04-13 NOTE — Progress Notes (Signed)
Established Patient Office Visit  Subjective:  Patient ID: Cindy Burke, female    DOB: April 14, 1966  Age: 55 y.o. MRN: [id number]  CC: No chief complaint on file.   HPI Cindy Burke is a 55 y/o female who has history of Hypertension, Pre diabetes, presents for lab review and medication refill. She states that she's compliant with her medications and continues to make healthy lifestyle changes. Her Serum creatinine done on 03/16/21 was 1.11 mg/dl, eGFR was 58. She states that she has increased her water intake. She does not check her blood pressure at home, adheres to [place] and smokes 2 cigarettes daily , admits the desire to quit. Her LDL was 122 mg/dl, total cholesterol was 206 and Triglycerides was 188 mg/dl. She is yet to schedule her Mammogram and Pap smear. She denies chest pain, palpitation, dizziness and shortness of breath. Overall, she states that she's doing well and offers no further complaint.  Past Medical History:  Diagnosis Date   Hypertension    Passed out    Pre-diabetes     Past Surgical History:  Procedure Laterality Date   NO PAST SURGERIES      Family History  Problem Relation Age of Onset   Hypertension Mother    Cancer Mother    Diabetes Mother    Hypertension Father    Heart attack Father    Diabetes Father    Sickle cell anemia Son    Stroke Brother     Social History   Socioeconomic History   Marital status: Single    Spouse name: Not on file   Number of children: Not on file   Years of education: Not on file   Highest education level: Not on file  Occupational History   Occupation: Surveyor, quantity: [employer]  Tobacco Use   Smoking status: Some Days    Packs/day: 0.00    Years: 10.00    Pack years: 0.00    Types: Cigarettes   Smokeless tobacco: Never   Tobacco comments:    2 cigarettes a day  Vaping Use   Vaping Use: Never used  Substance and Sexual Activity   Alcohol use: Yes    Alcohol/week: 1.0 - 2.0 standard drink     Types: 1 - 2 Shots of liquor per week    Comment: drinks weekend/socially   Drug use: Not Currently    Types: Marijuana    Comment: last use 2021   Sexual activity: Not on file  Other Topics Concern   Not on file  Social History Narrative   Sometimes has trouble getting to work, relies on other people to take the because [employer] bus does not run far enough, also has trouble falling asleep occassionally   Social Determinants of Radio broadcast assistant Strain: High Risk   Difficulty of Paying Living Expenses: Very hard  Food Insecurity: No Food Insecurity   Worried About Charity fundraiser in the Last Year: Never true   Arboriculturist in the Last Year: Never true  Transportation Needs: Unmet Transportation Needs   Lack of Transportation (Medical): Yes   Lack of Transportation (Non-Medical): Yes  Physical Activity: Not on file  Stress: Stress Concern Present   Feeling of Stress : Very much  Social Connections: Socially Isolated   Frequency of Communication with Friends and Family: Twice a week   Frequency of Social Gatherings with Friends and Family: Once a week   Attends Religious Services:  Never   Active Member of Clubs or Organizations: No   Attends Archivist Meetings: Never   Marital Status: Never married  Intimate Partner Violence: Not on file    Outpatient Medications Prior to Visit  Medication Sig Dispense Refill   amLODipine (NORVASC) 10 MG tablet Take 1 tablet (10 mg total) by mouth daily. 90 tablet 0   ibuprofen (ADVIL,MOTRIN) 800 MG tablet Take 1 tablet (800 mg total) by mouth every 8 (eight) hours as needed for moderate pain. 90 tablet 1   lisinopril (ZESTRIL) 20 MG tablet TAKE ONE TABLET BY MOUTH AT BEDTIME 90 tablet 0   metFORMIN (GLUCOPHAGE) 500 MG tablet TAKE ONE TABLET BY MOUTH EVERY DAY WITH BREAKFAST 90 tablet 0   chlorthalidone (HYGROTON) 25 MG tablet Take 1 tablet (25 mg total) by mouth daily. 30 tablet 0   No facility-administered medications  prior to visit.    No Known Allergies  ROS Review of Systems  Constitutional: Negative.   Eyes: Negative.   Respiratory: Negative.    Cardiovascular: Negative.   Skin: Negative.   Neurological: Negative.   Psychiatric/Behavioral: Negative.       Objective:    Physical Exam HENT:     Head: Normocephalic and atraumatic.     Mouth/Throat:     Mouth: Mucous membranes are moist.  Eyes:     Extraocular Movements: Extraocular movements intact.     Conjunctiva/sclera: Conjunctivae normal.     Pupils: Pupils are equal, round, and reactive to light.  Cardiovascular:     Rate and Rhythm: Normal rate and regular rhythm.     Pulses: Normal pulses.     Heart sounds: Normal heart sounds.  Pulmonary:     Effort: Pulmonary effort is normal.     Breath sounds: Normal breath sounds.  Neurological:     General: No focal deficit present.     Mental Status: She is alert and oriented to person, place, and time. Mental status is at baseline.  Psychiatric:        Mood and Affect: Mood normal.        Behavior: Behavior normal.        Thought Content: Thought content normal.        Judgment: Judgment normal.    BP 135/83 (BP Location: Left Arm, Patient Position: Sitting, Cuff Size: Large)   Pulse 91   Temp (!) 96.8 F (36 C)   Resp 16   Wt 239 lb 11.2 oz (108.7 kg)   LMP  (LMP Unknown)   SpO2 95%   BMI 39.89 kg/m  Wt Readings from Last 3 Encounters:  04/13/21 239 lb 11.2 oz (108.7 kg)  03/16/21 240 lb 14.4 oz (109.3 kg)  12/01/20 245 lb 1.6 oz (111.2 kg)   Encouraged weight loss  Health Maintenance Due  Topic Date Due   Pneumococcal Vaccine 6-14 Years old (1 - PCV) Never done   OPHTHALMOLOGY EXAM  Never done   Hepatitis C Screening  Never done   TETANUS/TDAP  Never done   Zoster Vaccines- Shingrix (1 of 2) Never done   COLONOSCOPY (Pts 45-10yr Insurance coverage will need to be confirmed)  Never done   MAMMOGRAM  Never done   PAP SMEAR-Modifier  01/24/2019   COVID-19  Vaccine (2 - Moderna risk series) 01/29/2020    There are no preventive care reminders to display for this patient.  Lab Results  Component Value Date   TSH 1.490 06/04/2019   Lab Results  Component Value  Date   WBC 7.5 06/16/2019   HGB 11.9 (L) 06/16/2019   HCT 37.6 06/16/2019   MCV 70.9 (L) 06/16/2019   PLT 310 06/16/2019   Lab Results  Component Value Date   NA 144 03/16/2021   K 3.7 03/16/2021   CO2 25 03/16/2021   GLUCOSE 89 03/16/2021   BUN 28 (H) 03/16/2021   CREATININE 1.11 (H) 03/16/2021   BILITOT 0.5 03/16/2021   ALKPHOS 76 03/16/2021   AST 14 03/16/2021   ALT 20 03/16/2021   PROT 7.5 03/16/2021   ALBUMIN 4.6 03/16/2021   CALCIUM 9.9 03/16/2021   ANIONGAP 11 06/16/2019   EGFR 58 (L) 03/16/2021   Lab Results  Component Value Date   CHOL 206 (H) 03/16/2021   Lab Results  Component Value Date   HDL 51 03/16/2021   Lab Results  Component Value Date   LDLCALC 122 (H) 03/16/2021   Lab Results  Component Value Date   TRIG 188 (H) 03/16/2021   Lab Results  Component Value Date   CHOLHDL 4.0 03/16/2021   Lab Results  Component Value Date   HGBA1C 5.9 (A) 12/01/2020   HGBA1C 5.9 12/01/2020   HGBA1C 0 (A) 12/01/2020   HGBA1C 0.0 12/01/2020      Assessment & Plan:    1. Essential hypertension -Her blood pressure is under control, she will continue on current medication -Low salt DASH diet -Take medications regularly on time -Exercise regularly as tolerated -Check blood pressure at least once a week at home or a nearby pharmacy and record -Goal is less than 140/90 and normal blood pressure is 120/80 - She was encouraged to increase water intake. - chlorthalidone (HYGROTON) 25 MG tablet; Take 1 tablet (25 mg total) by mouth daily.  Dispense: 90 tablet; Refill: 0  2. Health care maintenance -She was encouraged to schedule Mammogram and Pap smear.  3. Elevated lipids -The 10-year ASCVD risk score Mikey Bussing DC Jr., et al., 2013) is: 28.9%    Values used to calculate the score:     Age: 41 years     Sex: Female     Is Non-Hispanic African American: Yes     Diabetic: Yes     Tobacco smoker: Yes     Systolic Blood Pressure: 407 mmHg     Is BP treated: Yes     HDL Cholesterol: 51 mg/dL     Total Cholesterol: 206 mg/dL Her ASCVD risk was 28.9%, she was started on Atorvastatin 10 mg daily, was educated on medication side effects and advised to notify clinic, continue on low fat/cholesterol diet and exercise as tolerated. - atorvastatin (LIPITOR) 10 MG tablet; Take 1 tablet (10 mg total) by mouth daily.  Dispense: 30 tablet; Refill: 0      Follow-up: Return in about 4 weeks (around 05/11/2021), or if symptoms worsen or fail to improve.    Ladrea Holladay Jerold Coombe, NP

## 2021-04-13 NOTE — Patient Instructions (Signed)
Heart-Healthy Eating Plan Heart-healthy meal planning includes: Eating less unhealthy fats. Eating more healthy fats. Making other changes in your diet. Talk with your doctor or a diet specialist (dietitian) to create an eating plan that is right for you. What is my plan? Your doctor may recommend an eating plan that includes: Total fat: ______% or less of total calories a day. Saturated fat: ______% or less of total calories a day. Cholesterol: less than _________mg a day. What are tips for following this plan? Cooking Avoid frying your food. Try to bake, boil, grill, or broil it instead. You can also reduce fat by: Removing the skin from poultry. Removing all visible fats from meats. Steaming vegetables in water or broth. Meal planning  At meals, divide your plate into four equal parts: Fill one-half of your plate with vegetables and green salads. Fill one-fourth of your plate with whole grains. Fill one-fourth of your plate with lean protein foods. Eat 4-5 servings of vegetables per day. A serving of vegetables is: 1 cup of raw or cooked vegetables. 2 cups of raw leafy greens. Eat 4-5 servings of fruit per day. A serving of fruit is: 1 medium whole fruit.  cup of dried fruit.  cup of fresh, frozen, or canned fruit.  cup of 100% fruit juice. Eat more foods that have soluble fiber. These are apples, broccoli, carrots, beans, peas, and barley. Try to get 20-30 g of fiber per day. Eat 4-5 servings of nuts, legumes, and seeds per week: 1 serving of dried beans or legumes equals  cup after being cooked. 1 serving of nuts is  cup. 1 serving of seeds equals 1 tablespoon.  General information Eat more home-cooked food. Eat less restaurant, buffet, and fast food. Limit or avoid alcohol. Limit foods that are high in starch and sugar. Avoid fried foods. Lose weight if you are overweight. Keep track of how much salt (sodium) you eat. This is important if you have high blood  pressure. Ask your doctor to tell you more about this. Try to add vegetarian meals each week. Fats Choose healthy fats. These include olive oil and canola oil, flaxseeds, walnuts, almonds, and seeds. Eat more omega-3 fats. These include salmon, mackerel, sardines, tuna, flaxseed oil, and ground flaxseeds. Try to eat fish at least 2 times each week. Check food labels. Avoid foods with trans fats or high amounts of saturated fat. Limit saturated fats. These are often found in animal products, such as meats, butter, and cream. These are also found in plant foods, such as palm oil, palm kernel oil, and coconut oil. Avoid foods with partially hydrogenated oils in them. These have trans fats. Examples are stick margarine, some tub margarines, cookies, crackers, and other baked goods. What foods can I eat? Fruits All fresh, canned (in natural juice), or frozen fruits. Vegetables Fresh or frozen vegetables (raw, steamed, roasted, or grilled). Green salads. Grains Most grains. Choose whole wheat and whole grains most of the time. Rice andpasta, including brown rice and pastas made with whole wheat. Meats and other proteins Lean, well-trimmed beef, veal, pork, and lamb. Chicken and turkey without skin. All fish and shellfish. Wild duck, rabbit, pheasant, and venison. Egg whites or low-cholesterol egg substitutes. Dried beans, peas, lentils, and tofu. Seedsand most nuts. Dairy Low-fat or nonfat cheeses, including ricotta and mozzarella. Skim or 1% milk that is liquid, powdered, or evaporated. Buttermilk that is made with low-fatmilk. Nonfat or low-fat yogurt. Fats and oils Non-hydrogenated (trans-free) margarines. Vegetable oils, including soybean, sesame,   sunflower, olive, peanut, safflower, corn, canola, and cottonseed. Salad dressings or mayonnaisemade with a vegetable oil. Beverages Mineral water. Coffee and tea. Diet carbonated beverages. Sweets and desserts Sherbet, gelatin, and fruit ice. Small  amounts of dark chocolate. Limit all sweets and desserts. Seasonings and condiments All seasonings and condiments. The items listed above may not be a complete list of foods and drinks you can eat. Contact a dietitian for more options. What foods should I avoid? Fruits Canned fruit in heavy syrup. Fruit in cream or butter sauce. Fried fruit. Limitcoconut. Vegetables Vegetables cooked in cheese, cream, or butter sauce. Fried vegetables. Grains Breads that are made with saturated or trans fats, oils, or whole milk. Croissants. Sweet rolls. Donuts. High-fat crackers,such as cheese crackers. Meats and other proteins Fatty meats, such as hot dogs, ribs, sausage, bacon, rib-eye roast or steak. High-fat deli meats, such as salami and bologna. Caviar. Domestic duck andgoose. Organ meats, such as liver. Dairy Cream, sour cream, cream cheese, and creamed cottage cheese. Whole-milk cheeses. Whole or 2% milk that is liquid, evaporated, or condensed. Whole buttermilk. Cream sauce or high-fat cheese sauce. Yogurt that is made fromwhole milk. Fats and oils Meat fat, or shortening. Cocoa butter, hydrogenated oils, palm oil, coconut oil, palm kernel oil. Solid fats and shortenings, including bacon fat, salt pork, lard, and butter. Nondairy cream substitutes. Salad dressings with cheeseor sour cream. Beverages Regular sodas and juice drinks with added sugar. Sweets and desserts Frosting. Pudding. Cookies. Cakes. Pies. Milk chocolate or white chocolate.Buttered syrups. Full-fat ice cream or ice cream drinks. The items listed above may not be a complete list of foods and drinks to avoid. Contact a dietitian for more information. Summary Heart-healthy meal planning includes eating less unhealthy fats, eating more healthy fats, and making other changes in your diet. Eat a balanced diet. This includes fruits and vegetables, low-fat or nonfat dairy, lean protein, nuts and legumes, whole grains, and heart-healthy  oils and fats. This information is not intended to replace advice given to you by your health care provider. Make sure you discuss any questions you have with your healthcare provider. Document Revised: 12/12/2017 Document Reviewed: 11/15/2017 Elsevier Patient Education  [email or web address]">  DASH Eating Plan DASH stands for Dietary Approaches to Stop Hypertension. The DASH eating plan is a healthy eating plan that has been shown to: Reduce high blood pressure (hypertension). Reduce your risk for type 2 diabetes, heart disease, and stroke. Help with weight loss. What are tips for following this plan? Reading food labels Check food labels for the amount of salt (sodium) per serving. Choose foods with less than 5 percent of the Daily Value of sodium. Generally, foods with less than 300 milligrams (mg) of sodium per serving fit into this eating plan. To find whole grains, look for the word "whole" as the first word in the ingredient list. Shopping Buy products labeled as "low-sodium" or "no salt added." Buy fresh foods. Avoid canned foods and pre-made or frozen meals. Cooking Avoid adding salt when cooking. Use salt-free seasonings or herbs instead of table salt or sea salt. Check with your health care provider or pharmacist before using salt substitutes. Do not fry foods. Cook foods using healthy methods such as baking, boiling, grilling, roasting, and broiling instead. Cook with heart-healthy oils, such as olive, canola, avocado, soybean, or sunflower oil. Meal planning  Eat a balanced diet that includes: 4 or more servings of fruits and 4 or more servings of vegetables each day. Try to fill  one-half of your plate with fruits and vegetables. 6-8 servings of whole grains each day. Less than 6 oz (170 g) of lean meat, poultry, or fish each day. A 3-oz (85-g) serving of meat is about the same size as a deck of cards. One egg  equals 1 oz (28 g). 2-3 servings of low-fat dairy each day. One serving is 1 cup (237 mL). 1 serving of nuts, seeds, or beans 5 times each week. 2-3 servings of heart-healthy fats. Healthy fats called omega-3 fatty acids are found in foods such as walnuts, flaxseeds, fortified milks, and eggs. These fats are also found in cold-water fish, such as sardines, salmon, and mackerel. Limit how much you eat of: Canned or prepackaged foods. Food that is high in trans fat, such as some fried foods. Food that is high in saturated fat, such as fatty meat. Desserts and other sweets, sugary drinks, and other foods with added sugar. Full-fat dairy products. Do not salt foods before eating. Do not eat more than 4 egg yolks a week. Try to eat at least 2 vegetarian meals a week. Eat more home-cooked food and less restaurant, buffet, and fast food.  Lifestyle When eating at a restaurant, ask that your food be prepared with less salt or no salt, if possible. If you drink alcohol: Limit how much you use to: 0-1 drink a day for women who are not pregnant. 0-2 drinks a day for men. Be aware of how much alcohol is in your drink. In the [place] one drink equals one 12 oz bottle of beer (355 mL), one 5 oz glass of wine (148 mL), or one 1 oz glass of hard liquor (44 mL). General information Avoid eating more than 2,300 mg of salt a day. If you have hypertension, you may need to reduce your sodium intake to 1,500 mg a day. Work with your health care provider to maintain a healthy body weight or to lose weight. Ask what an ideal weight is for you. Get at least 30 minutes of exercise that causes your heart to beat faster (aerobic exercise) most days of the week. Activities may include walking, swimming, or biking. Work with your health care provider or dietitian to adjust your eating plan to your individual calorie needs. What foods should I eat? Fruits All fresh, dried, or frozen fruit. Canned fruit in natural juice  (without addedsugar). Vegetables Fresh or frozen vegetables (raw, steamed, roasted, or grilled). Low-sodium or reduced-sodium tomato and vegetable juice. Low-sodium or reduced-sodium tomatosauce and tomato paste. Low-sodium or reduced-sodium canned vegetables. Grains Whole-grain or whole-wheat bread. Whole-grain or whole-wheat pasta. Brown rice. [place]. Bulgur. Whole-grain and low-sodium cereals. Pita bread.Low-fat, low-sodium crackers. Whole-wheat flour tortillas. Meats and other proteins Skinless chicken or [place]. Ground chicken or [place]. Pork with fat trimmed off. Fish and seafood. Egg whites. Dried beans, peas, or lentils. Unsalted nuts, nut butters, and seeds. Unsalted canned beans. Lean cuts of beef with fat trimmed off. Low-sodium, lean precooked or cured meat, such as sausages or meatloaves. Dairy Low-fat (1%) or fat-free (skim) milk. Reduced-fat, low-fat, or fat-free cheeses. Nonfat, low-sodium ricotta or cottage cheese. Low-fat or nonfatyogurt. Low-fat, low-sodium cheese. Fats and oils Soft margarine without trans fats. Vegetable oil. Reduced-fat, low-fat, or light mayonnaise and salad dressings (reduced-sodium). Canola, safflower, olive, avocado, soybean, andsunflower oils. Avocado. Seasonings and condiments Herbs. Spices. Seasoning mixes without salt. Other foods Unsalted popcorn and pretzels. Fat-free sweets. The items listed above may not be a complete list of foods and beverages you  can eat. Contact a dietitian for more information. What foods should I avoid? Fruits Canned fruit in a light or heavy syrup. Fried fruit. Fruit in cream or buttersauce. Vegetables Creamed or fried vegetables. Vegetables in a cheese sauce. Regular canned vegetables (not low-sodium or reduced-sodium). Regular canned tomato sauce and paste (not low-sodium or reduced-sodium). Regular tomato and vegetable juice(not low-sodium or reduced-sodium). Angie Fava. Olives. Grains Baked goods made with fat,  such as croissants, muffins, or some breads. Drypasta or rice meal packs. Meats and other proteins Fatty cuts of meat. Ribs. Fried meat. Berniece Salines. Bologna, salami, and other precooked or cured meats, such as sausages or meat loaves. Fat from the back of a pig (fatback). Bratwurst. Salted nuts and seeds. Canned beans with added salt. Canned orsmoked fish. Whole eggs or egg yolks. Chicken or [place] with skin. Dairy Whole or 2% milk, cream, and half-and-half. Whole or full-fat cream cheese. Whole-fat or sweetened yogurt. Full-fat cheese. Nondairy creamers. Whippedtoppings. Processed cheese and cheese spreads. Fats and oils Butter. Stick margarine. Lard. Shortening. Ghee. Bacon fat. Tropical oils, suchas coconut, palm kernel, or palm oil. Seasonings and condiments Onion salt, garlic salt, seasoned salt, table salt, and sea salt. Worcestershire sauce. Tartar sauce. Barbecue sauce. Teriyaki sauce. Soy sauce, including reduced-sodium. Steak sauce. Canned and packaged gravies. Fish sauce. Oyster sauce. Cocktail sauce. Store-bought horseradish. Ketchup. Mustard. Meat flavorings and tenderizers. Bouillon cubes. Hot sauces. Pre-made or packaged marinades. Pre-made or packaged taco seasonings. Relishes. Regular saladdressings. Other foods Salted popcorn and pretzels. The items listed above may not be a complete list of foods and beverages you should avoid. Contact a dietitian for more information. Where to find more information National Heart, Lung, and Blood Institute: https://wilson-eaton.com/ American Heart Association: www.heart.org Academy of Nutrition and Dietetics: www.eatright.[place]: www.kidney.org Summary The DASH eating plan is a healthy eating plan that has been shown to reduce high blood pressure (hypertension). It may also reduce your risk for type 2 diabetes, heart disease, and stroke. When on the DASH eating plan, aim to eat more fresh fruits and vegetables, whole grains, lean  proteins, low-fat dairy, and heart-healthy fats. With the DASH eating plan, you should limit salt (sodium) intake to 2,300 mg a day. If you have hypertension, you may need to reduce your sodium intake to 1,500 mg a day. Work with your health care provider or dietitian to adjust your eating plan to your individual calorie needs. This information is not intended to replace advice given to you by your health care provider. Make sure you discuss any questions you have with your healthcare provider. Document Revised: 09/11/2019 Document Reviewed: 09/11/2019 Elsevier Patient Education  2022 [employer].

## 2021-04-30 ENCOUNTER — Emergency Department
Admission: EM | Admit: 2021-04-30 | Discharge: 2021-04-30 | Disposition: A | Discharge status: Home / Self Care | Payer: Disability Insurance | Attending: Emergency Medicine | Admitting: Emergency Medicine

## 2021-04-30 ENCOUNTER — Other Ambulatory Visit: Payer: Self-pay

## 2021-04-30 DIAGNOSIS — I10 Essential (primary) hypertension: Secondary | ICD-10-CM | POA: Insufficient documentation

## 2021-04-30 DIAGNOSIS — R112 Nausea with vomiting, unspecified: Secondary | ICD-10-CM

## 2021-04-30 DIAGNOSIS — Z79899 Other long term (current) drug therapy: Secondary | ICD-10-CM | POA: Insufficient documentation

## 2021-04-30 DIAGNOSIS — E860 Dehydration: Secondary | ICD-10-CM | POA: Insufficient documentation

## 2021-04-30 DIAGNOSIS — R1013 Epigastric pain: Secondary | ICD-10-CM | POA: Insufficient documentation

## 2021-04-30 DIAGNOSIS — Z7984 Long term (current) use of oral hypoglycemic drugs: Secondary | ICD-10-CM | POA: Insufficient documentation

## 2021-04-30 DIAGNOSIS — U071 COVID-19: Secondary | ICD-10-CM | POA: Insufficient documentation

## 2021-04-30 DIAGNOSIS — F17210 Nicotine dependence, cigarettes, uncomplicated: Secondary | ICD-10-CM | POA: Insufficient documentation

## 2021-04-30 DIAGNOSIS — E119 Type 2 diabetes mellitus without complications: Secondary | ICD-10-CM | POA: Insufficient documentation

## 2021-04-30 HISTORY — DX: Type 2 diabetes mellitus without complications: E11.9

## 2021-04-30 LAB — COMPREHENSIVE METABOLIC PANEL
ALT: 36 U/L (ref 0–44)
AST: 36 U/L (ref 15–41)
Albumin: 4.2 g/dL (ref 3.5–5.0)
Alkaline Phosphatase: 64 U/L (ref 38–126)
Anion gap: 11 (ref 5–15)
BUN: 44 mg/dL — ABNORMAL HIGH (ref 6–20)
CO2: 26 mmol/L (ref 22–32)
Calcium: 9 mg/dL (ref 8.9–10.3)
Chloride: 99 mmol/L (ref 98–111)
Creatinine, Ser: 1.71 mg/dL — ABNORMAL HIGH (ref 0.44–1.00)
GFR, Estimated: 35 mL/min — ABNORMAL LOW (ref >60)
Glucose, Bld: 129 mg/dL — ABNORMAL HIGH (ref 70–99)
Potassium: 3 mmol/L — ABNORMAL LOW (ref 3.5–5.1)
Sodium: 136 mmol/L (ref 135–145)
Total Bilirubin: 0.7 mg/dL (ref 0.3–1.2)
Total Protein: 8 g/dL (ref 6.5–8.1)

## 2021-04-30 LAB — RESP PANEL BY RT-PCR (FLU A&B, COVID) ARPGX2
Influenza A by PCR: NEGATIVE
Influenza B by PCR: NEGATIVE
SARS Coronavirus 2 by RT PCR: POSITIVE — AB

## 2021-04-30 LAB — CBC
HCT: 43.3 % (ref 36.0–46.0)
Hemoglobin: 13.6 g/dL (ref 12.0–15.0)
MCH: 22.1 pg — ABNORMAL LOW (ref 26.0–34.0)
MCHC: 31.4 g/dL (ref 30.0–36.0)
MCV: 70.3 fL — ABNORMAL LOW (ref 80.0–100.0)
Platelets: 189 10*3/uL (ref 150–400)
RBC: 6.16 MIL/uL — ABNORMAL HIGH (ref 3.87–5.11)
RDW: 14.4 % (ref 11.5–15.5)
WBC: 4.8 10*3/uL (ref 4.0–10.5)
nRBC: 0 % (ref 0.0–0.2)

## 2021-04-30 LAB — LIPASE, BLOOD: Lipase: 31 U/L (ref 11–51)

## 2021-04-30 MED ORDER — SODIUM CHLORIDE 0.9 % IV BOLUS
1000.0000 mL | Freq: Once | INTRAVENOUS | Status: AC
Start: 2021-04-30 — End: 2021-04-30
  Administered 2021-04-30: 1000 mL via INTRAVENOUS

## 2021-04-30 MED ORDER — ONDANSETRON HCL 4 MG/2ML IJ SOLN
4.0000 mg | Freq: Once | INTRAMUSCULAR | Status: AC
Start: 2021-04-30 — End: 2021-04-30
  Administered 2021-04-30: 4 mg via INTRAVENOUS
  Filled 2021-04-30: qty 2

## 2021-04-30 MED ORDER — ONDANSETRON 4 MG PO TBDP
4.0000 mg | ORAL_TABLET | Freq: Three times a day (TID) | ORAL | 0 refills | Status: DC | PRN
  Filled 2021-04-30: qty 20, 7d supply, fill #0

## 2021-04-30 MED ORDER — PAXLOVID 10 X 150 MG & 10 X 100MG PO TBPK
2.0000 | ORAL_TABLET | Freq: Two times a day (BID) | ORAL | 0 refills | Status: DC
  Filled 2021-04-30: qty 20, 5d supply, fill #0

## 2021-04-30 MED ORDER — ONDANSETRON 4 MG PO TBDP: 4.0000 mg | ORAL_TABLET | Freq: Three times a day (TID) | ORAL | 0 refills | Status: DC | PRN

## 2021-04-30 NOTE — ED Triage Notes (Signed)
Pt with c/o N/V since Wed. Unable to keep anything down per pt. Pt in NAD at this time. Pt denies being around anyone else with similar symptoms. Pt denies diarrhea. Pt with hx of htn and DM. Pt states she has been able to take most of her meds and keep them down.

## 2021-04-30 NOTE — ED Provider Notes (Signed)
[facility] Emergency Department Provider Note   ____________________________________________   Event Date/Time   First MD Initiated Contact with Patient 04/30/21 1029     (approximate)  I have reviewed the triage vital signs and the nursing notes.   HISTORY  Chief Complaint Emesis    HPI Cindy Burke is a 55 y.o. female with below stated past medical history who presents for nausea and vomiting  LOCATION: Mid epigastric DURATION: 3 days TIMING: Stable since onset SEVERITY: Severe QUALITY: Nausea/vomiting CONTEXT: Denies any recent travel, sick contacts, or food out of the ordinary MODIFYING FACTORS: Any p.o. intake causes vomiting and denies any relieving factors ASSOCIATED SYMPTOMS: Intermittent lightheadedness   Per medical record review patient has history of type 2 diabetes and hypertension          Past Medical History:  Diagnosis Date   Diabetes mellitus without complication ([place])    Hypertension    Passed out    Pre-diabetes     Patient Active Problem List   Diagnosis Date Noted   History of snoring 03/16/2021   History of anxiety 07/28/2020   Elevated lipids 04/21/2020   Prediabetes 04/21/2020   Health care maintenance 04/21/2020   Health care maintenance 04/21/2020   Right shoulder pain 04/21/2020   Cramping of feet 04/21/2020   Encephalopathy acute 04/28/2018   Syncope 06/06/2016   Essential hypertension 12/15/2015   Heavy menstrual bleeding 12/15/2015    Past Surgical History:  Procedure Laterality Date   NO PAST SURGERIES      Prior to Admission medications   Medication Sig Start Date End Date Taking? Authorizing Provider  nirmatrelvir/ritonavir EUA, renal dosing, (PAXLOVID) TBPK Take 2 tablets by mouth 2 (two) times daily for 5 days. Patient GFR is 35. Take nirmatrelvir (150 mg) one tablet twice daily for 5 days and ritonavir (100 mg) one tablet twice daily for 5 days. 04/30/21 05/05/21 Yes Odetta Forness, Vista Lawman,  MD  ondansetron (ZOFRAN ODT) 4 MG disintegrating tablet Take 1 tablet (4 mg total) by mouth every 8 (eight) hours as needed for nausea or vomiting. 04/30/21  Yes Shaquil Aldana, Vista Lawman, MD  ondansetron (ZOFRAN ODT) 4 MG disintegrating tablet Take 1 tablet (4 mg total) by mouth every 8 (eight) hours as needed for nausea or vomiting. 04/30/21  Yes Lexis Potenza, Vista Lawman, MD  amLODipine (NORVASC) 10 MG tablet Take 1 tablet (10 mg total) by mouth daily. 03/16/21   Iloabachie, Chioma E, NP  atorvastatin (LIPITOR) 10 MG tablet Take 1 tablet (10 mg total) by mouth daily. 04/13/21   Iloabachie, Chioma E, NP  chlorthalidone (HYGROTON) 25 MG tablet Take 1 tablet (25 mg total) by mouth daily. 04/13/21   Iloabachie, Chioma E, NP  ibuprofen (ADVIL,MOTRIN) 800 MG tablet Take 1 tablet (800 mg total) by mouth every 8 (eight) hours as needed for moderate pain. 06/19/18   Tukov-Yual, Arlyss Gandy, NP  lisinopril (ZESTRIL) 20 MG tablet TAKE ONE TABLET BY MOUTH AT BEDTIME 03/16/21 03/16/22  Iloabachie, Chioma E, NP  metFORMIN (GLUCOPHAGE) 500 MG tablet TAKE ONE TABLET BY MOUTH EVERY DAY WITH BREAKFAST 03/16/21 03/16/22  Iloabachie, Chioma E, NP    Allergies Patient has no known allergies.  Family History  Problem Relation Age of Onset   Hypertension Mother    Cancer Mother    Diabetes Mother    Hypertension Father    Heart attack Father    Diabetes Father    Sickle cell anemia Son    Stroke Brother  Social History Social History   Tobacco Use   Smoking status: Some Days    Packs/day: 0.00    Years: 10.00    Pack years: 0.00    Types: Cigarettes   Smokeless tobacco: Never   Tobacco comments:    2 cigarettes a day  Vaping Use   Vaping Use: Never used  Substance Use Topics   Alcohol use: Yes    Alcohol/week: 1.0 - 2.0 standard drink    Types: 1 - 2 Shots of liquor per week    Comment: drinks weekend/socially   Drug use: Not Currently    Types: Marijuana    Comment: last use 2021    Review of  Systems Constitutional: No fever/chills Eyes: No visual changes. ENT: No sore throat. Cardiovascular: Denies chest pain. Respiratory: Denies shortness of breath. Gastrointestinal: No abdominal pain.  Endorses nausea and vomiting.  No diarrhea. Genitourinary: Negative for dysuria. Musculoskeletal: Negative for acute arthralgias Skin: Negative for rash. Neurological: Negative for headaches, weakness/numbness/paresthesias in any extremity Psychiatric: Negative for suicidal ideation/homicidal ideation   ____________________________________________   PHYSICAL EXAM:  VITAL SIGNS: ED Triage Vitals  Enc Vitals Group     BP 04/30/21 1025 111/74     Pulse Rate 04/30/21 1025 68     Resp 04/30/21 1025 16     Temp 04/30/21 1025 97.8 F (36.6 C)     Temp Source 04/30/21 1025 Oral     SpO2 04/30/21 1025 95 %     Weight 04/30/21 1022 240 lb (108.9 kg)     Height 04/30/21 1022 5\' 5"  (1.651 m)     Head Circumference --      Peak Flow --      Pain Score 04/30/21 1022 0     Pain Loc --      Pain Edu? --      Excl. in [place]? --    Constitutional: Alert and oriented. Well appearing middle-aged overweight African-American female in no acute distress. Eyes: Conjunctivae are normal. PERRL. Head: Atraumatic. Nose: No congestion/rhinnorhea. Mouth/Throat: Mucous membranes are moist. Neck: No stridor Cardiovascular: Grossly normal heart sounds.  Good peripheral circulation. Respiratory: Normal respiratory effort.  No retractions. Gastrointestinal: Soft and nontender. No distention. Musculoskeletal: No obvious deformities Neurologic:  Normal speech and language. No gross focal neurologic deficits are appreciated. Skin:  Skin is warm and dry. No rash noted. Psychiatric: Mood and affect are normal. Speech and behavior are normal.  ____________________________________________   LABS (all labs ordered are listed, but only abnormal results are displayed)  Labs Reviewed  RESP PANEL BY RT-PCR (FLU  A&B, COVID) ARPGX2 - Abnormal; Notable for the following components:      Result Value   SARS Coronavirus 2 by RT PCR POSITIVE (*)    All other components within normal limits  COMPREHENSIVE METABOLIC PANEL - Abnormal; Notable for the following components:   Potassium 3.0 (*)    Glucose, Bld 129 (*)    BUN 44 (*)    Creatinine, Ser 1.71 (*)    GFR, Estimated 35 (*)    All other components within normal limits  CBC - Abnormal; Notable for the following components:   RBC 6.16 (*)    MCV 70.3 (*)    MCH 22.1 (*)    All other components within normal limits  LIPASE, BLOOD  URINALYSIS, COMPLETE (UACMP) WITH MICROSCOPIC  POC URINE PREG, ED    PROCEDURES  Procedure(s) performed (including Critical Care):  Procedures   ____________________________________________   INITIAL IMPRESSION /  ASSESSMENT AND PLAN / ED COURSE  As part of my medical decision making, I reviewed the following data within the electronic medical record, if available:  Nursing notes reviewed and incorporated, Labs reviewed, EKG interpreted, Old chart reviewed, Radiograph reviewed and Notes from prior ED visits reviewed and incorporated      Presentation most consistent with Viral Syndrome.  Patient has tested positive for COVID-19. Based on vitals and exam they are nontoxic and stable for discharge.  Given History and Exam I have a lower suspicion for: Emergent CardioPulmonary causes [such as Acute Asthma or COPD Exacerbation, acute Heart Failure or exacerbation, PE, PTX, atypical ACS, PNA]. Emergent Otolaryngeal causes [such as PTA, RPA, Ludwigs, Epiglottitis, EBV].  Regarding Emergent Travel or Immunosuppressive related infectious: I have a low suspicion for acute HIV.  Will provide strict return precautions and instructions on self-isolation/quarantine and anticipatory guidance.      ____________________________________________   FINAL CLINICAL IMPRESSION(S) / ED DIAGNOSES  Final diagnoses:   Dehydration  Non-intractable vomiting with nausea, unspecified vomiting type  COVID-19 virus infection     ED Discharge Orders          Ordered    ondansetron (ZOFRAN ODT) 4 MG disintegrating tablet  Every 8 hours PRN        04/30/21 1211    nirmatrelvir/ritonavir EUA, renal dosing, (PAXLOVID) TBPK  2 times daily        04/30/21 1213    ondansetron (ZOFRAN ODT) 4 MG disintegrating tablet  Every 8 hours PRN        04/30/21 1213             Note:  This document was prepared using Dragon voice recognition software and may include unintentional dictation errors.    Naaman Plummer, MD 05/04/21 [id number]

## 2021-04-30 NOTE — ED Notes (Signed)
Verbal consent received for discharge- signature pad not working.

## 2021-05-01 ENCOUNTER — Other Ambulatory Visit: Payer: Self-pay

## 2021-05-11 ENCOUNTER — Ambulatory Visit: Payer: Self-pay | Admitting: Gerontology

## 2021-05-11 ENCOUNTER — Other Ambulatory Visit: Payer: Self-pay

## 2021-05-24 ENCOUNTER — Ambulatory Visit: Payer: Self-pay

## 2021-06-01 ENCOUNTER — Ambulatory Visit: Payer: Self-pay | Admitting: Gerontology

## 2021-06-21 ENCOUNTER — Ambulatory Visit: Payer: Self-pay | Attending: Oncology | Admitting: *Deleted

## 2021-06-21 ENCOUNTER — Encounter: Payer: Self-pay | Admitting: *Deleted

## 2021-06-21 ENCOUNTER — Other Ambulatory Visit: Payer: Self-pay

## 2021-06-21 ENCOUNTER — Ambulatory Visit
Admission: RE | Admit: 2021-06-21 | Discharge: 2021-06-21 | Disposition: A | Discharge status: Home / Self Care | Payer: Self-pay | Source: Ambulatory Visit | Attending: Oncology | Admitting: Oncology

## 2021-06-21 VITALS — BP 166/94 | HR 65 | Temp 98.1°F | Ht 63.0 in | Wt 247.0 lb

## 2021-06-21 DIAGNOSIS — Z0000 Encounter for general adult medical examination without abnormal findings: Secondary | ICD-10-CM

## 2021-06-21 NOTE — Patient Instructions (Signed)
Gave patient hand-out, Women Staying Healthy, Active and Well from [facility], with education on breast health, pap smears, heart and colon health. 

## 2021-06-21 NOTE — Progress Notes (Signed)
Subjective:     Patient ID: Cindy Burke, female   DOB: Nov 24, 1965, 56 y.o.   MRN: [id number]  HPI  [facility] Medical History Record - 06/21/21 [id number]       Breast History   Screening cycle New    Provider ([facility]) [facility]    Initial Mammogram 06/21/21    Last Mammogram Annual    Last Mammogram Date 12/22/18    Provider (Mammogram)  [facility]    Recent Breast Symptoms None      Breast Cancer History   Breast Cancer History No personal or family history    Comments/Details possibly  p-great grandmother breast      Previous History of Breast Problems   Breast Surgery or Biopsy None    Breast Implants N/A    BSE Done Monthly      Gynecological/Obstetrical History   LMP --   40's   Is there any chance that the client could be pregnant?  No    Age at menarche 53    Age at menopause 51's    PAP smear history Annually    Date of last PAP  01/24/16    Age at first live birth 67    Breast fed children No    DES Exposure No    Family history of Cervial, Uterine or Ovarian cancer No    Hysterectomy No    Cervix removed No    Ovaries removed No    Laser/Cryosurgery No    Current method of birth control None    Current method of Estrogen/Hormone replacement None    Smoking history None               Review of Systems     Objective:   Physical Exam Chest:  Breasts:    Right: No swelling, bleeding, inverted nipple, mass, nipple discharge, skin change or tenderness.     Left: No swelling, bleeding, inverted nipple, mass, nipple discharge, skin change or tenderness.    Abdominal:     Palpations: There is no hepatomegaly or splenomegaly.  Genitourinary:    Exam position: Lithotomy position.     Labia:        Right: No rash, tenderness, lesion or injury.        Left: No rash, tenderness, lesion or injury.      Urethra: No prolapse or urethral pain.     Vagina: No signs of injury and foreign body. No vaginal discharge, erythema, tenderness, bleeding, lesions or prolapsed  vaginal walls.     Cervix: No cervical motion tenderness, discharge, friability, lesion, erythema, cervical bleeding or eversion.     Uterus: Not deviated.      Adnexa:        Right: No mass.         Left: No mass.       Rectum: No mass.  Lymphadenopathy:     Upper Body:     Right upper body: No supraclavicular or axillary adenopathy.     Left upper body: No supraclavicular or axillary adenopathy.       Assessment:     55 year old female presents to [facility] for clinical breast exam, pap and mammogram.  Clinical breast exam reveals a dry peeling healed yeast like rash.  Patient states it gets very red at times.  Encouraged her to use [facility] or follow up with her PCP at [facility] when the rash is red and pruritic. There is no dominant mass, skin changes,  nipple discharge, or lymphadenopathy.  Specimen collected for pap smear.  Cervix is very posterior.  Patient has been screened for eligibility.  She does not have any insurance, Medicare or Medicaid.  She also meets financial eligibility.   Risk Assessment     Risk Scores       06/21/2021   Last edited by: Theodore Demark, RN   5-year risk: 1.4 %   Lifetime risk: 7.7 %               Plan:     Screening mammogram ordered.  Specimen for pap sent to the lab.

## 2021-06-23 LAB — IGP, APTIMA HPV: HPV Aptima: NEGATIVE

## 2021-06-27 ENCOUNTER — Other Ambulatory Visit: Payer: Self-pay

## 2021-06-27 DIAGNOSIS — R920 Mammographic microcalcification found on diagnostic imaging of breast: Secondary | ICD-10-CM

## 2021-06-28 ENCOUNTER — Ambulatory Visit: Payer: Self-pay | Admitting: Gerontology

## 2021-06-28 ENCOUNTER — Encounter: Payer: Self-pay | Admitting: Gerontology

## 2021-06-28 ENCOUNTER — Other Ambulatory Visit: Payer: Self-pay

## 2021-06-28 VITALS — BP 160/86 | HR 74 | Temp 97.6°F | Resp 16 | Ht 65.5 in | Wt 246.2 lb

## 2021-06-28 DIAGNOSIS — I10 Essential (primary) hypertension: Secondary | ICD-10-CM

## 2021-06-28 DIAGNOSIS — E785 Hyperlipidemia, unspecified: Secondary | ICD-10-CM

## 2021-06-28 DIAGNOSIS — R928 Other abnormal and inconclusive findings on diagnostic imaging of breast: Secondary | ICD-10-CM | POA: Insufficient documentation

## 2021-06-28 DIAGNOSIS — R7303 Prediabetes: Secondary | ICD-10-CM

## 2021-06-28 MED ORDER — ATORVASTATIN CALCIUM 10 MG PO TABS
10.0000 mg | ORAL_TABLET | Freq: Every day | ORAL | 0 refills | Status: DC
  Filled 2021-06-28: qty 90, 90d supply, fill #0

## 2021-06-28 MED ORDER — AMLODIPINE BESYLATE 10 MG PO TABS
10.0000 mg | ORAL_TABLET | Freq: Every day | ORAL | 0 refills | Status: DC
  Filled 2021-06-28: qty 90, 90d supply, fill #0

## 2021-06-28 MED ORDER — CHLORTHALIDONE 25 MG PO TABS
25.0000 mg | ORAL_TABLET | Freq: Every day | ORAL | 0 refills | Status: DC
  Filled 2021-06-28: qty 90, 90d supply, fill #0

## 2021-06-28 MED ORDER — LISINOPRIL 20 MG PO TABS
ORAL_TABLET | Freq: Every day | ORAL | 0 refills | Status: DC
  Filled 2021-06-28: qty 90, 90d supply, fill #0

## 2021-06-28 MED ORDER — METFORMIN HCL 500 MG PO TABS
ORAL_TABLET | ORAL | 0 refills | Status: DC
  Filled 2021-06-28: qty 90, 90d supply, fill #0

## 2021-06-28 NOTE — Patient Instructions (Signed)

## 2021-06-28 NOTE — Progress Notes (Signed)
Established Patient Office Visit  Subjective:  Patient ID: Cindy Burke, female    DOB: [birth year]  Age: 55 y.o. MRN: [id number]  CC:    HPI Cindy Burke is a 55 y/o female who has history of Hypertension, Pre diabetes presents for routine follow up visit and medication refill. Her blood pressure during visit was elevated, she states that she is compliant with her medications, but has been out of her medication for at least 1 week. She states that she checks her blood pressure at home and it's usually in the 140's/80. She denies chest pain, palpitation, light headedness, vision changes and paresthesia. She was seen at the ED on 04/30/21 for emesis, her serum creatinine was 1.71 mg/dl and eGFR was 35. She had Mammogram and Pap smear done on 06/21/21 and it showed  calcifications in the right breast, calcifications warrant further evaluation with magnified views. In the left breast, no findings suspicious for malignancy. Overall, she states that she's doing well and offers no further complaint.  Past Medical History:  Diagnosis Date   Diabetes mellitus without complication ([place])    Hypertension    Passed out    Pre-diabetes     Past Surgical History:  Procedure Laterality Date   NO PAST SURGERIES      Family History  Problem Relation Age of Onset   Hypertension Mother    Cancer Mother    Diabetes Mother    Hypertension Father    Heart attack Father    Diabetes Father    Stroke Brother    Sickle cell anemia Son    Breast cancer Neg Hx     Social History   Socioeconomic History   Marital status: Single    Spouse name: Not on file   Number of children: Not on file   Years of education: Not on file   Highest education level: Not on file  Occupational History   Occupation: Surveyor, quantity: [employer]  Tobacco Use   Smoking status: Every Day    Packs/day: 0.00    Years: 10.00    Pack years: 0.00    Types: Cigarettes   Smokeless tobacco: Never   Tobacco comments:     2 cigarettes a day  Vaping Use   Vaping Use: Never used  Substance and Sexual Activity   Alcohol use: Yes    Alcohol/week: 1.0 - 2.0 standard drink    Types: 1 - 2 Shots of liquor per week    Comment: drinks weekend/socially   Drug use: Not Currently    Types: Marijuana    Comment: last use 2021   Sexual activity: Not on file  Other Topics Concern   Not on file  Social History Narrative   Sometimes has trouble getting to work, relies on other people to take the because [employer] bus does not run far enough, also has trouble falling asleep occassionally   Social Determinants of Radio broadcast assistant Strain: High Risk   Difficulty of Paying Living Expenses: Very hard  Food Insecurity: No Food Insecurity   Worried About Charity fundraiser in the Last Year: Never true   Arboriculturist in the Last Year: Never true  Transportation Needs: Unmet Transportation Needs   Lack of Transportation (Medical): Yes   Lack of Transportation (Non-Medical): Yes  Physical Activity: Not on file  Stress: Stress Concern Present   Feeling of Stress : Very much  Social Connections: Socially Isolated  Frequency of Communication with Friends and Family: Twice a week   Frequency of Social Gatherings with Friends and Family: Once a week   Attends Religious Services: Never   Marine scientist or Organizations: No   Attends Music therapist: Never   Marital Status: Never married  Human resources officer Violence: Not on file    Outpatient Medications Prior to Visit  Medication Sig Dispense Refill   ondansetron (ZOFRAN ODT) 4 MG disintegrating tablet Dissolve 1 tablet (4 mg total) by mouth every 8 (eight) hours as needed for nausea or vomiting. 20 tablet 0   amLODipine (NORVASC) 10 MG tablet Take 1 tablet (10 mg total) by mouth daily. 90 tablet 0   atorvastatin (LIPITOR) 10 MG tablet Take 1 tablet (10 mg total) by mouth daily. 30 tablet 0   chlorthalidone (HYGROTON) 25 MG tablet Take 1  tablet (25 mg total) by mouth daily. 90 tablet 0   ibuprofen (ADVIL,MOTRIN) 800 MG tablet Take 1 tablet (800 mg total) by mouth every 8 (eight) hours as needed for moderate pain. 90 tablet 1   lisinopril (ZESTRIL) 20 MG tablet TAKE ONE TABLET BY MOUTH AT BEDTIME 90 tablet 0   metFORMIN (GLUCOPHAGE) 500 MG tablet TAKE ONE TABLET BY MOUTH EVERY DAY WITH BREAKFAST 90 tablet 0   No facility-administered medications prior to visit.    No Known Allergies  ROS Review of Systems  Constitutional: Negative.   Eyes: Negative.   Respiratory: Negative.    Cardiovascular: Negative.   Endocrine: Negative.   Neurological: Negative.   Psychiatric/Behavioral: Negative.       Objective:    Physical Exam HENT:     Head: Normocephalic and atraumatic.  Eyes:     Extraocular Movements: Extraocular movements intact.     Conjunctiva/sclera: Conjunctivae normal.     Pupils: Pupils are equal, round, and reactive to light.  Cardiovascular:     Rate and Rhythm: Normal rate and regular rhythm.     Pulses: Normal pulses.     Heart sounds: Normal heart sounds.  Pulmonary:     Effort: Pulmonary effort is normal.     Breath sounds: Normal breath sounds.  Neurological:     General: No focal deficit present.     Mental Status: She is alert and oriented to person, place, and time. Mental status is at baseline.    BP (!) 160/86 (BP Location: Right Arm, Patient Position: Sitting, Cuff Size: Large)   Pulse 74   Temp 97.6 F (36.4 C)   Resp 16   Ht 5' 5.5" (1.664 m)   Wt 246 lb 3.2 oz (111.7 kg)   LMP  (LMP Unknown)   SpO2 99%   BMI 40.35 kg/m  Wt Readings from Last 3 Encounters:  06/28/21 246 lb 3.2 oz (111.7 kg)  06/21/21 247 lb (112 kg)  04/30/21 240 lb (108.9 kg)   Encouraged weight loss  Health Maintenance Due  Topic Date Due   OPHTHALMOLOGY EXAM  Never done   Hepatitis C Screening  Never done   TETANUS/TDAP  Never done   Zoster Vaccines- Shingrix (1 of 2) Never done   COLONOSCOPY (Pts  45-29yr Insurance coverage will need to be confirmed)  Never done   COVID-19 Vaccine (2 - Moderna risk series) 01/29/2020   Pneumococcal Vaccine 04654Years old (2 - PCV) 04/21/2021   HEMOGLOBIN A1C  05/31/2021    There are no preventive care reminders to display for this patient.  Lab Results  Component Value  Date   TSH 1.490 06/04/2019   Lab Results  Component Value Date   WBC 4.8 04/30/2021   HGB 13.6 04/30/2021   HCT 43.3 04/30/2021   MCV 70.3 (L) 04/30/2021   PLT 189 04/30/2021   Lab Results  Component Value Date   NA 136 04/30/2021   K 3.0 (L) 04/30/2021   CO2 26 04/30/2021   GLUCOSE 129 (H) 04/30/2021   BUN 44 (H) 04/30/2021   CREATININE 1.71 (H) 04/30/2021   BILITOT 0.7 04/30/2021   ALKPHOS 64 04/30/2021   AST 36 04/30/2021   ALT 36 04/30/2021   PROT 8.0 04/30/2021   ALBUMIN 4.2 04/30/2021   CALCIUM 9.0 04/30/2021   ANIONGAP 11 04/30/2021   EGFR 58 (L) 03/16/2021   Lab Results  Component Value Date   CHOL 206 (H) 03/16/2021   Lab Results  Component Value Date   HDL 51 03/16/2021   Lab Results  Component Value Date   LDLCALC 122 (H) 03/16/2021   Lab Results  Component Value Date   TRIG 188 (H) 03/16/2021   Lab Results  Component Value Date   CHOLHDL 4.0 03/16/2021   Lab Results  Component Value Date   HGBA1C 5.9 (A) 12/01/2020   HGBA1C 5.9 12/01/2020   HGBA1C 0 (A) 12/01/2020   HGBA1C 0.0 12/01/2020      Assessment & Plan:    1. Essential hypertension - Her blood pressure was not under control because she was out of her medications, she will continue on current medication, advised to check her blood pressure at home and her goal should be less than 140/90. She was advised to continue on DASH diet and exercise as tolerated. - Basic Metabolic Panel (BMET); will be rechecked and was advised to increase water intake. - amLODipine (NORVASC) 10 MG tablet; Take 1 tablet (10 mg total) by mouth once daily.  Dispense: 90 tablet; Refill: 0 -  chlorthalidone (HYGROTON) 25 MG tablet; Take 1 tablet (25 mg total) by mouth once daily.  Dispense: 90 tablet; Refill: 0 - lisinopril (ZESTRIL) 20 MG tablet; TAKE ONE TABLET BY MOUTH ONCE DAILY AT BEDTIME  Dispense: 90 tablet; Refill: 0 - Basic Metabolic Panel (BMET)  2. Elevated lipids - She will continue on current medication, low fat/cholesterol diet and exercise as tolerated. - atorvastatin (LIPITOR) 10 MG tablet; Take 1 tablet (10 mg total) by mouth once daily.  Dispense: 90 tablet; Refill: 0  3. Prediabetes - She will  continue on current medication, low carb/non concentrated sweet diet and exercise as tolerated. - HgB A1c; Future - metFORMIN (GLUCOPHAGE) 500 MG tablet; TAKE ONE TABLET BY MOUTH EVERY DAY WITH BREAKFAST  Dispense: 90 tablet; Refill: 0 - HgB A1c  4. Abnormal mammogram - She was unable to be contacted for her breast Ultra sound to be scheduled. The letter was printed for her and was advised to call and schedule a follow up appointment for breast ultrasound.     Follow-up: Return in about 29 days (around 07/27/2021), or if symptoms worsen or fail to improve.    Nissi Doffing Jerold Coombe, NP

## 2021-06-29 ENCOUNTER — Other Ambulatory Visit: Payer: Self-pay

## 2021-06-29 LAB — BASIC METABOLIC PANEL
BUN/Creatinine Ratio: 24 — ABNORMAL HIGH (ref 9–23)
BUN: 20 mg/dL (ref 6–24)
CO2: 23 mmol/L (ref 20–29)
Calcium: 9.2 mg/dL (ref 8.7–10.2)
Chloride: 108 mmol/L — ABNORMAL HIGH (ref 96–106)
Creatinine, Ser: 0.85 mg/dL (ref 0.57–1.00)
Glucose: 95 mg/dL (ref 65–99)
Potassium: 3.5 mmol/L (ref 3.5–5.2)
Sodium: 144 mmol/L (ref 134–144)
eGFR: 80 mL/min/{1.73_m2} (ref >59)

## 2021-06-29 LAB — HEMOGLOBIN A1C
Est. average glucose Bld gHb Est-mCnc: 111 mg/dL
Hgb A1c MFr Bld: 5.5 % (ref 4.8–5.6)

## 2021-06-30 ENCOUNTER — Ambulatory Visit
Admission: RE | Admit: 2021-06-30 | Discharge: 2021-06-30 | Disposition: A | Discharge status: Home / Self Care | Payer: Self-pay | Source: Ambulatory Visit | Attending: Oncology | Admitting: Oncology

## 2021-06-30 ENCOUNTER — Emergency Department: Payer: Medicaid Other

## 2021-06-30 ENCOUNTER — Other Ambulatory Visit: Payer: Self-pay

## 2021-06-30 ENCOUNTER — Emergency Department
Admission: EM | Admit: 2021-06-30 | Discharge: 2021-06-30 | Disposition: A | Discharge status: Home / Self Care | Payer: Medicaid Other | Attending: Emergency Medicine | Admitting: Emergency Medicine

## 2021-06-30 DIAGNOSIS — R920 Mammographic microcalcification found on diagnostic imaging of breast: Secondary | ICD-10-CM | POA: Insufficient documentation

## 2021-06-30 DIAGNOSIS — M25562 Pain in left knee: Secondary | ICD-10-CM

## 2021-06-30 DIAGNOSIS — M1712 Unilateral primary osteoarthritis, left knee: Secondary | ICD-10-CM | POA: Insufficient documentation

## 2021-06-30 DIAGNOSIS — E669 Obesity, unspecified: Secondary | ICD-10-CM | POA: Insufficient documentation

## 2021-06-30 DIAGNOSIS — I10 Essential (primary) hypertension: Secondary | ICD-10-CM | POA: Diagnosis not present

## 2021-06-30 DIAGNOSIS — F17210 Nicotine dependence, cigarettes, uncomplicated: Secondary | ICD-10-CM | POA: Diagnosis not present

## 2021-06-30 DIAGNOSIS — Z7984 Long term (current) use of oral hypoglycemic drugs: Secondary | ICD-10-CM | POA: Insufficient documentation

## 2021-06-30 DIAGNOSIS — E119 Type 2 diabetes mellitus without complications: Secondary | ICD-10-CM | POA: Diagnosis not present

## 2021-06-30 MED ORDER — ACETAMINOPHEN 500 MG PO TABS
1000.0000 mg | ORAL_TABLET | Freq: Once | ORAL | Status: AC
  Administered 2021-06-30: 1000 mg via ORAL
  Filled 2021-06-30: qty 2

## 2021-06-30 MED ORDER — IBUPROFEN 400 MG PO TABS
400.0000 mg | ORAL_TABLET | Freq: Once | ORAL | Status: AC
  Administered 2021-06-30: 400 mg via ORAL
  Filled 2021-06-30: qty 1

## 2021-06-30 NOTE — Progress Notes (Signed)
stere

## 2021-06-30 NOTE — ED Triage Notes (Signed)
Pt comes with c/o left knee pain for two days. Pt denies any injuires.

## 2021-06-30 NOTE — ED Provider Notes (Signed)
[facility] Emergency Department Provider Note  ____________________________________________   Event Date/Time   First MD Initiated Contact with Patient 06/30/21 1114     (approximate)  I have reviewed the triage vital signs and the nursing notes.   HISTORY  Chief Complaint Knee Pain   HPI Cindy Burke is a 55 y.o. female with medical history of HTN, DM and who menstrual bleeding who presents for assessment approximately 2 days of some pain in her left knee.  Patient denies any recent falls or injuries.  She states it hurts when she walks on it.  She denies any known history of arthritis.  She states she has no pain in her left ankle or hip or any in the right lower extremity.  She denies any other acute symptoms including fevers, chills, cough, nausea, vomiting, diarrhea, dysuria, rash or any other acute sick symptoms.  She states it is all located on the front of her knee around the patella she has no pain on the back of her leg or knee.  No history of gout.         Past Medical History:  Diagnosis Date  . Diabetes mellitus without complication ([place])   . Hypertension   . Passed out   . Pre-diabetes     Patient Active Problem List   Diagnosis Date Noted  . Abnormal mammogram 06/28/2021  . History of snoring 03/16/2021  . History of anxiety 07/28/2020  . Elevated lipids 04/21/2020  . Prediabetes 04/21/2020  . Health care maintenance 04/21/2020  . Health care maintenance 04/21/2020  . Right shoulder pain 04/21/2020  . Cramping of feet 04/21/2020  . Encephalopathy acute 04/28/2018  . Syncope 06/06/2016  . Essential hypertension 12/15/2015  . Heavy menstrual bleeding 12/15/2015    Past Surgical History:  Procedure Laterality Date  . NO PAST SURGERIES      Prior to Admission medications   Medication Sig Start Date End Date Taking? Authorizing Provider  amLODipine (NORVASC) 10 MG tablet Take 1 tablet (10 mg total) by mouth once daily.  06/28/21   Iloabachie, Chioma E, NP  atorvastatin (LIPITOR) 10 MG tablet Take 1 tablet (10 mg total) by mouth once daily. 06/28/21   Iloabachie, Chioma E, NP  chlorthalidone (HYGROTON) 25 MG tablet Take 1 tablet (25 mg total) by mouth once daily. 06/28/21   Iloabachie, Chioma E, NP  lisinopril (ZESTRIL) 20 MG tablet TAKE ONE TABLET BY MOUTH ONCE DAILY AT BEDTIME 06/28/21 06/28/22  Iloabachie, Chioma E, NP  metFORMIN (GLUCOPHAGE) 500 MG tablet TAKE ONE TABLET BY MOUTH EVERY DAY WITH BREAKFAST 06/28/21 06/28/22  Iloabachie, Chioma E, NP  ondansetron (ZOFRAN ODT) 4 MG disintegrating tablet Dissolve 1 tablet (4 mg total) by mouth every 8 (eight) hours as needed for nausea or vomiting. 04/30/21   Naaman Plummer, MD    Allergies Patient has no known allergies.  Family History  Problem Relation Age of Onset  . Hypertension Mother   . Cancer Mother   . Diabetes Mother   . Hypertension Father   . Heart attack Father   . Diabetes Father   . Stroke Brother   . Sickle cell anemia Son   . Breast cancer Neg Hx     Social History Social History   Tobacco Use  . Smoking status: Every Day    Packs/day: 0.00    Years: 10.00    Pack years: 0.00    Types: Cigarettes  . Smokeless tobacco: Never  . Tobacco comments:  2 cigarettes a day  Vaping Use  . Vaping Use: Never used  Substance Use Topics  . Alcohol use: Yes    Alcohol/week: 1.0 - 2.0 standard drink    Types: 1 - 2 Shots of liquor per week    Comment: drinks weekend/socially  . Drug use: Not Currently    Types: Marijuana    Comment: last use 2021    Review of Systems  Review of Systems  Constitutional:  Negative for chills and fever.  HENT:  Negative for sore throat.   Eyes:  Negative for pain.  Respiratory:  Negative for cough and stridor.   Cardiovascular:  Negative for chest pain.  Gastrointestinal:  Negative for vomiting.  Genitourinary:  Negative for dysuria.  Musculoskeletal:  Positive for joint pain (L knee).  Skin:  Negative  for rash.  Neurological:  Negative for seizures, loss of consciousness and headaches.  Psychiatric/Behavioral:  Negative for suicidal ideas.   All other systems reviewed and are negative.    ____________________________________________   PHYSICAL EXAM:  VITAL SIGNS: ED Triage Vitals  Enc Vitals Group     BP 06/30/21 1101 (!) 155/89     Pulse Rate 06/30/21 1101 68     Resp 06/30/21 1101 18     Temp 06/30/21 1101 98 F (36.7 C)     Temp Source 06/30/21 1101 Oral     SpO2 06/30/21 1101 100 %     Weight 06/30/21 1101 246 lb (111.6 kg)     Height 06/30/21 1101 5' 5.5" (1.664 m)     Head Circumference --      Peak Flow --      Pain Score 06/30/21 1105 9     Pain Loc --      Pain Edu? --      Excl. in [place]? --    Vitals:   06/30/21 1101  BP: (!) 155/89  Pulse: 68  Resp: 18  Temp: 98 F (36.7 C)  SpO2: 100%   Physical Exam Vitals and nursing note reviewed.  Constitutional:      General: She is not in acute distress.    Appearance: She is well-developed. She is obese.  HENT:     Head: Normocephalic and atraumatic.     Right Ear: External ear normal.     Left Ear: External ear normal.     Nose: Nose normal.  Eyes:     Conjunctiva/sclera: Conjunctivae normal.  Cardiovascular:     Rate and Rhythm: Normal rate and regular rhythm.     Heart sounds: No murmur heard. Pulmonary:     Effort: Pulmonary effort is normal. No respiratory distress.  Abdominal:     General: There is no distension.     Palpations: Abdomen is soft.  Musculoskeletal:     Cervical back: Neck supple.  Skin:    General: Skin is warm and dry.  Neurological:     Mental Status: She is alert and oriented to person, place, and time.    Sensation is intact to light touch in all extremities.  Patient is full strength throughout both lower extremities including on flexion extension of left knee.  There is some mild pain on range of motion of the left knee both passive and active.  There is no large effusion  or any tenderness posteriorly or over specific tendon.  No laxity on anterior posterior medial or lateral drawer testing.  Patient's legs are perfused well bilaterally. ____________________________________________   LABS (all labs ordered are listed,  but only abnormal results are displayed)  Labs Reviewed - No data to display ____________________________________________  EKG  ____________________________________________  RADIOLOGY  ED MD interpretation: Plain film of the left knee shows no significant fracture or dislocation.  There is no large effusion.  There is possible calcified loose body in the lateral suprapatellar recess versus some soft tissue calcifications.  There is also evidence of arthritis.  Official radiology report(s): DG Knee Complete 4 Views Left  Result Date: 06/30/2021 CLINICAL DATA:  LEFT knee pain for 2 days, weight-bearing with difficulty, denies injury EXAM: LEFT KNEE - COMPLETE 4+ VIEW COMPARISON:  None FINDINGS: Osseous mineralization low normal. 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 Calcified loose body at suprapatellar recess laterally versus soft tissue calcification. No acute fracture, dislocation, or bone destruction. No joint effusion. IMPRESSION: Degenerative changes LEFT knee with question calcified loose body at lateral suprapatellar recess versus soft tissue calcification. No acute bony abnormalities. Electronically Signed   By: Lavonia Dana M.D.   On: 06/30/2021 12:15    ____________________________________________   PROCEDURES  Procedure(s) performed (including Critical Care):  Procedures   ____________________________________________   INITIAL IMPRESSION / ASSESSMENT AND PLAN / ED COURSE      Patient presents with above to history exam for assessment approximately 2 days of some nontraumatic pain at left knee worse with ambulation weightbearing.  On exam the knee is relatively unremarkable  and she has full strength and sensation and is neurovascular intact distally.  No any history of injury or significant laxity or point tenderness on exam to suggest lateral ligament, medial ligament or other significant ligamentous injury..  She is able to range it without significant pain and seems pain is primarily with weightbearing of the lower suspicion for acute gout.  History exam is not consistent with her joint.  Pain is all anterior behind the patella there is no posterior pain or tenderness to suggest a DVT.  Plain film of the left knee shows no significant fracture or dislocation.  There is no large effusion.  There is possible calcified loose body in the lateral suprapatellar recess versus some soft tissue calcifications.  There is also evidence of arthritis.  Suspect patient may be having some symptomatic calcifications behind the patella versus arthritis.  Advised I think it is safe for tactile ibuprofen but that she must follow-up with her PCP in the next couple days.  She is amenable to plan.  Discharged stable condition.  Strict return precautions advised and discussed.        ____________________________________________   FINAL CLINICAL IMPRESSION(S) / ED DIAGNOSES  Final diagnoses:  Acute pain of left knee    Medications  acetaminophen (TYLENOL) tablet 1,000 mg (1,000 mg Oral Given 06/30/21 1128)  ibuprofen (ADVIL) tablet 400 mg (400 mg Oral Given 06/30/21 1128)     ED Discharge Orders     None        Note:  This document was prepared using Dragon voice recognition software and may include unintentional dictation errors.    Lucrezia Starch, MD 06/30/21 1242

## 2021-07-05 ENCOUNTER — Other Ambulatory Visit: Payer: Self-pay

## 2021-07-05 ENCOUNTER — Encounter: Payer: Self-pay | Admitting: Emergency Medicine

## 2021-07-05 ENCOUNTER — Emergency Department: Payer: Medicaid Other

## 2021-07-05 ENCOUNTER — Emergency Department
Admission: EM | Admit: 2021-07-05 | Discharge: 2021-07-05 | Disposition: A | Discharge status: Home / Self Care | Payer: Medicaid Other | Attending: Emergency Medicine | Admitting: Emergency Medicine

## 2021-07-05 DIAGNOSIS — Z79899 Other long term (current) drug therapy: Secondary | ICD-10-CM | POA: Diagnosis not present

## 2021-07-05 DIAGNOSIS — E119 Type 2 diabetes mellitus without complications: Secondary | ICD-10-CM | POA: Insufficient documentation

## 2021-07-05 DIAGNOSIS — F17210 Nicotine dependence, cigarettes, uncomplicated: Secondary | ICD-10-CM | POA: Insufficient documentation

## 2021-07-05 DIAGNOSIS — K2100 Gastro-esophageal reflux disease with esophagitis, without bleeding: Secondary | ICD-10-CM | POA: Diagnosis not present

## 2021-07-05 DIAGNOSIS — R079 Chest pain, unspecified: Secondary | ICD-10-CM

## 2021-07-05 DIAGNOSIS — I10 Essential (primary) hypertension: Secondary | ICD-10-CM | POA: Diagnosis not present

## 2021-07-05 DIAGNOSIS — Z7984 Long term (current) use of oral hypoglycemic drugs: Secondary | ICD-10-CM | POA: Diagnosis not present

## 2021-07-05 LAB — BASIC METABOLIC PANEL
Anion gap: 10 (ref 5–15)
BUN: 32 mg/dL — ABNORMAL HIGH (ref 6–20)
CO2: 27 mmol/L (ref 22–32)
Calcium: 9.3 mg/dL (ref 8.9–10.3)
Chloride: 99 mmol/L (ref 98–111)
Creatinine, Ser: 1.56 mg/dL — ABNORMAL HIGH (ref 0.44–1.00)
GFR, Estimated: 39 mL/min — ABNORMAL LOW (ref >60)
Glucose, Bld: 110 mg/dL — ABNORMAL HIGH (ref 70–99)
Potassium: 3.1 mmol/L — ABNORMAL LOW (ref 3.5–5.1)
Sodium: 136 mmol/L (ref 135–145)

## 2021-07-05 LAB — CBC
HCT: 40.7 % (ref 36.0–46.0)
Hemoglobin: 13.1 g/dL (ref 12.0–15.0)
MCH: 22.9 pg — ABNORMAL LOW (ref 26.0–34.0)
MCHC: 32.2 g/dL (ref 30.0–36.0)
MCV: 71.2 fL — ABNORMAL LOW (ref 80.0–100.0)
Platelets: 239 10*3/uL (ref 150–400)
RBC: 5.72 MIL/uL — ABNORMAL HIGH (ref 3.87–5.11)
RDW: 14.9 % (ref 11.5–15.5)
WBC: 9.4 10*3/uL (ref 4.0–10.5)
nRBC: 0 % (ref 0.0–0.2)

## 2021-07-05 LAB — TROPONIN I (HIGH SENSITIVITY)
Troponin I (High Sensitivity): 5 ng/L (ref <18)
Troponin I (High Sensitivity): 5 ng/L

## 2021-07-05 MED ORDER — POTASSIUM CHLORIDE CRYS ER 20 MEQ PO TBCR
40.0000 meq | EXTENDED_RELEASE_TABLET | Freq: Once | ORAL | Status: AC
  Administered 2021-07-05: 40 meq via ORAL
  Filled 2021-07-05: qty 2

## 2021-07-05 MED ORDER — FAMOTIDINE 20 MG PO TABS
20.0000 mg | ORAL_TABLET | Freq: Every day | ORAL | 1 refills | Status: AC
  Filled 2021-07-05: qty 30, 30d supply, fill #0

## 2021-07-05 MED ORDER — ALUM & MAG HYDROXIDE-SIMETH 200-200-20 MG/5ML PO SUSP
30.0000 mL | Freq: Once | ORAL | Status: AC
  Administered 2021-07-05: 30 mL via ORAL
  Filled 2021-07-05: qty 30

## 2021-07-05 MED ORDER — LIDOCAINE VISCOUS HCL 2 % MT SOLN
15.0000 mL | Freq: Once | OROMUCOSAL | Status: AC
  Administered 2021-07-05: 15 mL via ORAL
  Filled 2021-07-05: qty 15

## 2021-07-05 NOTE — ED Triage Notes (Signed)
Pt to ED via EMS from work c/o substernal chest pain that started tonight around 1845 and lasting approx 45 minutes, non radiating, described as tightness, nausea without vomiting, felt SOB during, sweaty and light headed but denies LOC.  Pt took '325mg'$  ASA at work.  Hx of HTN and DBM.  EMS vitals 152/85 BP, 70 HR, 98% RA, 132 CBG.  Pt denies pain upon arrival, states some nausea is returning, A&Ox4, chest rise even and unlabored.

## 2021-07-05 NOTE — ED Provider Notes (Signed)
[facility] Emergency Department Provider Note  ____________________________________________   I have reviewed the triage vital signs and the nursing notes.   HISTORY  Chief Complaint Chest Pain   History limited by: Not Limited   HPI Cindy Burke is a 55 y.o. female who presents to the emergency department today because of concerns for chest pain.  Patient states she was at work.  She had gone outside to talk to her manager when she came back and she started developing chest pain.  It was located in the center part of her chest.  It did not radiate.  She states it was associated with some sweating and she felt like she was going to pass out.  By the time my exam and she states that most of her symptoms have resolved.  She still has some slight soreness in her chest.  She denies similar symptoms in the past.  States that prior to the episode she was in her normal state of health today. No recent travel.    Records reviewed. Per medical record review patient has a history of HTN, DM, HLD.   Past Medical History:  Diagnosis Date   Diabetes mellitus without complication ([place])    Hypertension    Passed out    Pre-diabetes     Patient Active Problem List   Diagnosis Date Noted   Abnormal mammogram 06/28/2021   History of snoring 03/16/2021   History of anxiety 07/28/2020   Elevated lipids 04/21/2020   Prediabetes 04/21/2020   Health care maintenance 04/21/2020   Health care maintenance 04/21/2020   Right shoulder pain 04/21/2020   Cramping of feet 04/21/2020   Encephalopathy acute 04/28/2018   Syncope 06/06/2016   Essential hypertension 12/15/2015   Heavy menstrual bleeding 12/15/2015    Past Surgical History:  Procedure Laterality Date   NO PAST SURGERIES      Prior to Admission medications   Medication Sig Start Date End Date Taking? Authorizing Provider  amLODipine (NORVASC) 10 MG tablet Take 1 tablet (10 mg total) by mouth once daily.  06/28/21   Iloabachie, Chioma E, NP  atorvastatin (LIPITOR) 10 MG tablet Take 1 tablet (10 mg total) by mouth once daily. 06/28/21   Iloabachie, Chioma E, NP  chlorthalidone (HYGROTON) 25 MG tablet Take 1 tablet (25 mg total) by mouth once daily. 06/28/21   Iloabachie, Chioma E, NP  lisinopril (ZESTRIL) 20 MG tablet TAKE ONE TABLET BY MOUTH ONCE DAILY AT BEDTIME 06/28/21 06/28/22  Iloabachie, Chioma E, NP  metFORMIN (GLUCOPHAGE) 500 MG tablet TAKE ONE TABLET BY MOUTH EVERY DAY WITH BREAKFAST 06/28/21 06/28/22  Iloabachie, Chioma E, NP  ondansetron (ZOFRAN ODT) 4 MG disintegrating tablet Dissolve 1 tablet (4 mg total) by mouth every 8 (eight) hours as needed for nausea or vomiting. 04/30/21   Naaman Plummer, MD    Allergies Patient has no known allergies.  Family History  Problem Relation Age of Onset   Hypertension Mother    Cancer Mother    Diabetes Mother    Hypertension Father    Heart attack Father    Diabetes Father    Stroke Brother    Sickle cell anemia Son    Breast cancer Neg Hx     Social History Social History   Tobacco Use   Smoking status: Every Day    Packs/day: 0.00    Years: 10.00    Pack years: 0.00    Types: Cigarettes   Smokeless tobacco: Never  Tobacco comments:    2 cigarettes a day  Vaping Use   Vaping Use: Never used  Substance Use Topics   Alcohol use: Yes    Alcohol/week: 1.0 - 2.0 standard drink    Types: 1 - 2 Shots of liquor per week    Comment: drinks weekend/socially   Drug use: Not Currently    Types: Marijuana    Comment: last use 2021    Review of Systems Constitutional: No fever/chills Eyes: No visual changes. ENT: No sore throat. Cardiovascular: Positive for chest pain. Respiratory: Denies shortness of breath. Gastrointestinal: No abdominal pain.  Positive for nausea.  Genitourinary: Negative for dysuria. Musculoskeletal: Negative for back pain. Skin: Positive for sweating.  Neurological: Positive for lightheadedness.    ____________________________________________   PHYSICAL EXAM:  VITAL SIGNS: ED Triage Vitals  Enc Vitals Group     BP 07/05/21 1939 (!) 152/90     Pulse Rate 07/05/21 1939 80     Resp 07/05/21 1939 15     Temp 07/05/21 1939 97.8 F (36.6 C)     Temp Source 07/05/21 1939 Oral     SpO2 07/05/21 1939 100 %     Weight 07/05/21 1940 240 lb (108.9 kg)     Height 07/05/21 1940 '5\' 5"'$  (1.651 m)     Head Circumference --      Peak Flow --      Pain Score 07/05/21 1940 0   Constitutional: Alert and oriented.  Eyes: Conjunctivae are normal.  ENT      Head: Normocephalic and atraumatic.      Nose: No congestion/rhinnorhea.      Mouth/Throat: Mucous membranes are moist.      Neck: No stridor. Hematological/Lymphatic/Immunilogical: No cervical lymphadenopathy. Cardiovascular: Normal rate, regular rhythm.  No murmurs, rubs, or gallops.  Respiratory: Normal respiratory effort without tachypnea nor retractions. Breath sounds are clear and equal bilaterally. No wheezes/rales/rhonchi. Gastrointestinal: Soft and non tender. No rebound. No guarding.  Genitourinary: Deferred Musculoskeletal: Normal range of motion in all extremities. No lower extremity edema. Neurologic:  Normal speech and language. No gross focal neurologic deficits are appreciated.  Skin:  Skin is warm, dry and intact. No rash noted. Psychiatric: Mood and affect are normal. Speech and behavior are normal. Patient exhibits appropriate insight and judgment.  ____________________________________________    LABS (pertinent positives/negatives)  CBC wbc 9.4, hgb 13.1, plt 239 Trop hs 5 x 2 BMP na 136, k 3.1, glu 110, cr 1.56 ____________________________________________   EKG  I, Nance Pear, attending physician, personally viewed and interpreted this EKG  EKG Time: 1941 Rate: 79 Rhythm: sinus rhythm Axis: normal Intervals: qtc 434 QRS: narrow ST changes: no st elevation, t wave inversion I, II, V2,  V3 Impression: abnormal ekg ____________________________________________    RADIOLOGY  CXR No acute abnormality  ____________________________________________   PROCEDURES  Procedures  ____________________________________________   INITIAL IMPRESSION / ASSESSMENT AND PLAN / ED COURSE  Pertinent labs & imaging results that were available during my care of the patient were reviewed by me and considered in my medical decision making (see chart for details).   Patient presented to the emergency department today because of concern for chest pain. EKG and CXR without concerning findings. Troponin was negative times 2. At the time of my initial exam the patient still had some soreness which did improve after GI cocktail. At this point I think GERD likely and I discussed this with the patient. Doubt PE or dissection. Will plan on discharge with prescription  for antacid. Discussed follow up with patient.   ____________________________________________   FINAL CLINICAL IMPRESSION(S) / ED DIAGNOSES  Final diagnoses:  Chest pain, unspecified type  Gastroesophageal reflux disease with esophagitis, unspecified whether hemorrhage     Note: This dictation was prepared with Dragon dictation. Any transcriptional errors that result from this process are unintentional     Nance Pear, MD 07/05/21 2229

## 2021-07-05 NOTE — Discharge Instructions (Addendum)
Please seek medical attention for any high fevers, chest pain, shortness of breath, change in behavior, persistent vomiting, bloody stool or any other new or concerning symptoms.  

## 2021-07-06 ENCOUNTER — Other Ambulatory Visit: Payer: Self-pay

## 2021-07-07 ENCOUNTER — Other Ambulatory Visit: Payer: Self-pay

## 2021-07-07 ENCOUNTER — Ambulatory Visit
Admission: RE | Admit: 2021-07-07 | Discharge: 2021-07-07 | Disposition: A | Discharge status: Home / Self Care | Payer: Medicaid Other | Source: Ambulatory Visit | Attending: Oncology | Admitting: Oncology

## 2021-07-07 DIAGNOSIS — R920 Mammographic microcalcification found on diagnostic imaging of breast: Secondary | ICD-10-CM | POA: Diagnosis present

## 2021-07-07 HISTORY — PX: BREAST BIOPSY: SHX20

## 2021-07-11 DIAGNOSIS — D0511 Intraductal carcinoma in situ of right breast: Secondary | ICD-10-CM

## 2021-07-11 LAB — SURGICAL PATHOLOGY

## 2021-07-11 NOTE — Progress Notes (Signed)
Unable to contact patient.  Contacted sister Pamala Hurry who is Emergency Contact, and asked patient to call.  Sister states patient 42 "phone is out".  She will contact patient and have her call.

## 2021-07-12 NOTE — Progress Notes (Signed)
Patient called back.  Navigation initiated Scheduled with Dr. Bary Castilla and Dr. Grayland Ormond.  Will complete Medicaid application at Med/Onc visit.

## 2021-07-15 DIAGNOSIS — D0511 Intraductal carcinoma in situ of right breast: Secondary | ICD-10-CM | POA: Insufficient documentation

## 2021-07-15 NOTE — Progress Notes (Signed)
[place]  [phone] [phone]) [phone]  ID: Cindy Burke OB: 1965/11/03  MR#: [id number]  EGB#:[id number]  Patient Care Team: Langston Reusing, NP as PCP - General (Gerontology) Rico Junker, RN as Registered Nurse Theodore Demark, RN as Registered Nurse Theodore Demark, RN as Oncology Nurse Navigator  CHIEF COMPLAINT: DCIS, right breast.  INTERVAL HISTORY: Patient is a 55 year old female who was noted to have an abnormality on routine mammogram.  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  She has had trouble healing at her biopsy site he was recently placed on antibiotics by surgery yesterday.  She otherwise feels well.  She has no neurologic complaints.  She denies any recent fevers or illnesses.  She has a good appetite and denies weight loss.  She has no chest pain, shortness of breath, cough, or hemoptysis.  She denies any nausea, vomiting, constipation, or diarrhea.  She has no urinary complaints.  Patient otherwise feels well and offers no further specific complaints today.  REVIEW OF SYSTEMS:   Review of Systems  Constitutional: Negative.  Negative for fever, malaise/fatigue and weight loss.  Respiratory: Negative.  Negative for cough, hemoptysis and shortness of breath.   Cardiovascular: Negative.  Negative for chest pain and leg swelling.  Gastrointestinal: Negative.  Negative for abdominal pain.  Genitourinary: Negative.  Negative for dysuria.  Musculoskeletal: Negative.  Negative for back pain.  Skin: Negative.  Negative for rash.  Neurological: Negative.  Negative for dizziness, focal weakness, weakness and headaches.  Psychiatric/Behavioral: Negative.  The patient is not nervous/anxious.    As per HPI. Otherwise, a complete review of systems is negative.  PAST MEDICAL HISTORY: Past Medical History:  Diagnosis Date   Diabetes mellitus without complication ([place])    Hypertension    Passed out     Pre-diabetes     PAST SURGICAL HISTORY: Past Surgical History:  Procedure Laterality Date   BREAST BIOPSY Right 07/07/2021   stereo bx, ribbon clip, path pending   BREAST BIOPSY Right 07/07/2021   Stereo bx,X clip, path pending   NO PAST SURGERIES      FAMILY HISTORY: Family History  Problem Relation Age of Onset   Hypertension Mother    Cancer Mother    Diabetes Mother    Hypertension Father    Heart attack Father    Diabetes Father    Stroke Brother    Sickle cell anemia Son    Breast cancer Neg Hx     ADVANCED DIRECTIVES (Y/N):  N  HEALTH MAINTENANCE: Social History   Tobacco Use   Smoking status: Every Day    Packs/day: 0.00    Years: 10.00    Pack years: 0.00    Types: Cigarettes   Smokeless tobacco: Never   Tobacco comments:    2 cigarettes a day  Vaping Use   Vaping Use: Never used  Substance Use Topics   Alcohol use: Yes    Alcohol/week: 1.0 - 2.0 standard drink    Types: 1 - 2 Shots of liquor per week    Comment: drinks weekend/socially   Drug use: Not Currently    Types: Marijuana    Comment: last use 2021     Colonoscopy:  PAP:  Bone density:  Lipid panel:  No Known Allergies  Current Outpatient Medications  Medication Sig Dispense Refill   amLODipine (NORVASC) 10 MG tablet Take 1 tablet (10 mg total) by mouth once daily. 90 tablet 0   atorvastatin (  LIPITOR) 10 MG tablet Take 1 tablet (10 mg total) by mouth once daily. 90 tablet 0   chlorthalidone (HYGROTON) 25 MG tablet Take 1 tablet (25 mg total) by mouth once daily. 90 tablet 0   famotidine (PEPCID) 20 MG tablet Take 1 tablet (20 mg total) by mouth once daily. 30 tablet 1   lisinopril (ZESTRIL) 20 MG tablet TAKE ONE TABLET BY MOUTH ONCE DAILY AT BEDTIME 90 tablet 0   metFORMIN (GLUCOPHAGE) 500 MG tablet TAKE ONE TABLET BY MOUTH EVERY DAY WITH BREAKFAST 90 tablet 0   ondansetron (ZOFRAN ODT) 4 MG disintegrating tablet Dissolve 1 tablet (4 mg total) by mouth every 8 (eight) hours as  needed for nausea or vomiting. 20 tablet 0   sulfamethoxazole-trimethoprim (BACTRIM DS) 800-160 MG tablet Take 1 tablet (160 mg of trimethoprim total) by mouth every 12 (twelve) hours for 10 days 20 tablet 0   No current facility-administered medications for this visit.    OBJECTIVE: There were no vitals filed for this visit.   There is no height or weight on file to calculate BMI.    ECOG FS:0 - Asymptomatic  General: Well-developed, well-nourished, no acute distress. Eyes: Pink conjunctiva, anicteric sclera. HEENT: Normocephalic, moist mucous membranes. Breast: Right medial breast with biopsy lesion and mild erythema. Lungs: No audible wheezing or coughing. Heart: Regular rate and rhythm. Abdomen: Soft, nontender, no obvious distention. Musculoskeletal: No edema, cyanosis, or clubbing. Neuro: Alert, answering all questions appropriately. Cranial nerves grossly intact. Skin: No rashes or petechiae noted. Psych: Normal affect.   LAB RESULTS:  Lab Results  Component Value Date   NA 136 07/05/2021   K 3.1 (L) 07/05/2021   CL 99 07/05/2021   CO2 27 07/05/2021   GLUCOSE 110 (H) 07/05/2021   BUN 32 (H) 07/05/2021   CREATININE 1.56 (H) 07/05/2021   CALCIUM 9.3 07/05/2021   PROT 8.0 04/30/2021   ALBUMIN 4.2 04/30/2021   AST 36 04/30/2021   ALT 36 04/30/2021   ALKPHOS 64 04/30/2021   BILITOT 0.7 04/30/2021   GFRNONAA 39 (L) 07/05/2021   GFRAA 86 06/22/2020    Lab Results  Component Value Date   WBC 9.4 07/05/2021   NEUTROABS 4.1 04/28/2018   HGB 13.1 07/05/2021   HCT 40.7 07/05/2021   MCV 71.2 (L) 07/05/2021   PLT 239 07/05/2021     STUDIES: DG Chest Port 1 View  Result Date: 07/05/2021 CLINICAL DATA:  Substernal chest pain EXAM: PORTABLE CHEST 1 VIEW COMPARISON:  06/16/2019 FINDINGS: Lungs are clear.  No pleural effusion or pneumothorax. The heart is normal in size. IMPRESSION: No evidence of acute cardiopulmonary disease. Electronically Signed   By: Julian Hy M.D.   On: 07/05/2021 20:04   DG Knee Complete 4 Views Left  Result Date: 06/30/2021 CLINICAL DATA:  LEFT knee pain for 2 days, weight-bearing with difficulty, denies injury EXAM: LEFT KNEE - COMPLETE 4+ VIEW COMPARISON:  None FINDINGS: Osseous mineralization low normal. 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 Calcified loose body at suprapatellar recess laterally versus soft tissue calcification. No acute fracture, dislocation, or bone destruction. No joint effusion. IMPRESSION: Degenerative changes LEFT knee with question calcified loose body at lateral suprapatellar recess versus soft tissue calcification. No acute bony abnormalities. Electronically Signed   By: Lavonia Dana M.D.   On: 06/30/2021 12:15   MS DIGITAL SCREENING TOMO BILATERAL  Result Date: 06/27/2021 CLINICAL DATA:  Screening. EXAM: DIGITAL SCREENING BILATERAL MAMMOGRAM WITH TOMOSYNTHESIS AND CAD TECHNIQUE: Bilateral  screening digital craniocaudal and mediolateral oblique mammograms were obtained. Bilateral screening digital breast tomosynthesis was performed. The images were evaluated with computer-aided detection. COMPARISON:  None. ACR Breast Density Category a: The breast tissue is almost entirely fatty. FINDINGS: In the right breast, calcifications warrant further evaluation with magnified views. In the left breast, no findings suspicious for malignancy. IMPRESSION: Further evaluation is suggested for calcifications in the right breast. RECOMMENDATION: Diagnostic mammogram of the right breast. (Code:[id number]) The patient will be contacted regarding the findings, and additional imaging will be scheduled. BI-RADS CATEGORY  0: Incomplete. Need additional imaging evaluation and/or prior mammograms for comparison. Electronically Signed   By: Everlean Alstrom M.D.   On: 06/27/2021 07:41   MS DIGITAL DIAG TOMO UNI RIGHT  Result Date: 06/30/2021 CLINICAL DATA:  55 year old female recalled  from baseline screening mammogram dated 06/21/2021 for right breast calcifications. EXAM: DIGITAL DIAGNOSTIC UNILATERAL RIGHT MAMMOGRAM WITH TOMOSYNTHESIS AND CAD TECHNIQUE: Right digital diagnostic mammography and breast tomosynthesis was performed. The images were evaluated with computer-aided detection. COMPARISON:  Previous exam(s). ACR Breast Density Category a: The breast tissue is almost entirely fatty. FINDINGS: Fine linear and pleomorphic calcifications are identified spanning approximately 3.5 cm in the central medial right breast at mid to anterior depth. These have a suspicious morphology. IMPRESSION: Suspicious right breast calcifications spanning approximately 3.5 cm. Recommendation is for stereotactic biopsy. RECOMMENDATION: Stereotactic biopsy of the right breast. I have discussed the findings and recommendations with the patient. If applicable, a reminder letter will be sent to the patient regarding the next appointment. BI-RADS CATEGORY  4: Suspicious. Electronically Signed   By: Kristopher Oppenheim M.D.   On: 06/30/2021 10:56  MS CLIP PLACEMENT RIGHT  Result Date: 07/07/2021 CLINICAL DATA:  Post stereotactic core needle biopsy of right breast calcifications. EXAM: DIAGNOSTIC RIGHT MAMMOGRAM POST STEREOTACTIC BIOPSY COMPARISON:  Previous exam(s). FINDINGS: Mammographic images were obtained following stereotactic guided biopsy of the right breast. The biopsy marking clips are in expected positions at the biopsy sites. IMPRESSION: Appropriate positioning of the ribbon shaped biopsy marking clip at the site of biopsy in the right breast lower inner quadrant, posterior extent of calcifications. Appropriate positioning of the X shaped biopsy marking clip at the site of biopsy in the right breast lower inner quadrant, anterior extent of calcifications. Final Assessment: Post Procedure Mammograms for Marker Placement Electronically Signed   By: Fidela Salisbury M.D.   On: 07/07/2021 13:06  MM RT  BREAST BX W LOC DEV 1ST LESION IMAGE BX SPEC STEREO GUIDE  Addendum Date: 07/11/2021   ADDENDUM REPORT: 07/11/2021 11:33 ADDENDUM: PATHOLOGY revealed: Site A. RIGHT BREAST, LOWER INNER QUADRANT CALCIFICATIONS (POSTERIOR EXTENT); STEREOTACTIC BIOPSY: - DUCTAL CARCINOMA IN SITU, LOW GRADE, WITH ASSOCIATED CALCIFICATIONS. Pathology results are CONCORDANT with imaging findings, per Dr. Fidela Salisbury. PATHOLOGY revealed: Site B. RIGHT BREAST, LOWER INNER QUADRANT CALCIFICATIONS (ANTERIOR EXTENT); STEREOTACTIC BIOPSY: - DUCTAL CARCINOMA IN SITU, LOW GRADE, WITH ASSOCIATED CALCIFICATIONS. Pathology results are CONCORDANT with imaging findings, per Dr. Fidela Salisbury. Multiple attempts to contact patient were unsuccessful. Notified patient's provider office and left voice mail on 07/10/2021, regarding the need to contact patient with biopsy results and recommendations. Notified Al Pimple RN from [facility] on 07/10/2021, who stated that patient is enrolled in her BCCCP program and she will contact patient with biopsy results and recommendations as well. RECOMMENDATIONS: RECOMMENDATIONS 1. Surgical consultation. Request for surgical consultation relayed to Al Pimple RN at [facility] by Electa Sniff RN on 07/10/2021. 2. Consider bilateral breast MRI  to evaluate extent of breast disease. The distance between the 2 markers is 2.3 cm. Pathology results reported by Electa Sniff RN on 07/11/2021. Electronically Signed   By: Fidela Salisbury M.D.   On: 07/11/2021 11:33   Result Date: 07/11/2021 CLINICAL DATA:  Right breast calcifications. EXAM: RIGHT BREAST STEREOTACTIC CORE NEEDLE BIOPSY COMPARISON:  Previous exams. FINDINGS: The patient and I discussed the procedure of stereotactic-guided biopsy including benefits and alternatives. We discussed the high likelihood of a successful procedure. We discussed the risks of the procedure including infection, bleeding, tissue injury, clip migration, and  inadequate sampling. Informed written consent was given. The usual time out protocol was performed immediately prior to the procedure. Using sterile technique and 1% Lidocaine as local anesthetic, under stereotactic guidance, a 9 gauge vacuum assisted device was used to perform core needle biopsy of calcifications in the slightly lower inner right breast, posterior extent, using a medial approach. At the conclusion of the procedure, ribbon shaped tissue marker clip was deployed into the biopsy cavity. Next, using sterile technique and 1% Lidocaine as local anesthetic, under stereotactic guidance, a 9 gauge vacuum assisted device was used to perform core needle biopsy of calcifications in the slightly lower inner right breast, anterior extent, using a superior approach. At the conclusion of the procedure, X shaped tissue marker clip was deployed into the biopsy cavity. Specimen radiograph was performed showing presence of calcifications in both specimens. Specimens with calcifications are identified for pathology. Lesion quadrant: Lower inner quadrant. Follow-up 2-view mammogram was performed and dictated separately. IMPRESSION: Stereotactic-guided biopsy of right breast lower inner quadrant calcifications, posterior and anterior extent. No apparent complications. Electronically Signed: By: Fidela Salisbury M.D. On: 07/07/2021 13:05  MM RT BREAST BX W LOC DEV EA AD LESION IMG BX SPEC STEREO GUIDE  Addendum Date: 07/11/2021   ADDENDUM REPORT: 07/11/2021 11:33 ADDENDUM: PATHOLOGY revealed: Site A. RIGHT BREAST, LOWER INNER QUADRANT CALCIFICATIONS (POSTERIOR EXTENT); STEREOTACTIC BIOPSY: - DUCTAL CARCINOMA IN SITU, LOW GRADE, WITH ASSOCIATED CALCIFICATIONS. Pathology results are CONCORDANT with imaging findings, per Dr. Fidela Salisbury. PATHOLOGY revealed: Site B. RIGHT BREAST, LOWER INNER QUADRANT CALCIFICATIONS (ANTERIOR EXTENT); STEREOTACTIC BIOPSY: - DUCTAL CARCINOMA IN SITU, LOW GRADE, WITH ASSOCIATED  CALCIFICATIONS. Pathology results are CONCORDANT with imaging findings, per Dr. Fidela Salisbury. Multiple attempts to contact patient were unsuccessful. Notified patient's provider office and left voice mail on 07/10/2021, regarding the need to contact patient with biopsy results and recommendations. Notified Al Pimple RN from [facility] on 07/10/2021, who stated that patient is enrolled in her BCCCP program and she will contact patient with biopsy results and recommendations as well. RECOMMENDATIONS: RECOMMENDATIONS 1. Surgical consultation. Request for surgical consultation relayed to Al Pimple RN at [facility] by Electa Sniff RN on 07/10/2021. 2. Consider bilateral breast MRI to evaluate extent of breast disease. The distance between the 2 markers is 2.3 cm. Pathology results reported by Electa Sniff RN on 07/11/2021. Electronically Signed   By: Fidela Salisbury M.D.   On: 07/11/2021 11:33   Result Date: 07/11/2021 CLINICAL DATA:  Right breast calcifications. EXAM: RIGHT BREAST STEREOTACTIC CORE NEEDLE BIOPSY COMPARISON:  Previous exams. FINDINGS: The patient and I discussed the procedure of stereotactic-guided biopsy including benefits and alternatives. We discussed the high likelihood of a successful procedure. We discussed the risks of the procedure including infection, bleeding, tissue injury, clip migration, and inadequate sampling. Informed written consent was given. The usual time out protocol was performed immediately prior to the procedure. Using sterile technique and  1% Lidocaine as local anesthetic, under stereotactic guidance, a 9 gauge vacuum assisted device was used to perform core needle biopsy of calcifications in the slightly lower inner right breast, posterior extent, using a medial approach. At the conclusion of the procedure, ribbon shaped tissue marker clip was deployed into the biopsy cavity. Next, using sterile technique and 1% Lidocaine as local anesthetic, under  stereotactic guidance, a 9 gauge vacuum assisted device was used to perform core needle biopsy of calcifications in the slightly lower inner right breast, anterior extent, using a superior approach. At the conclusion of the procedure, X shaped tissue marker clip was deployed into the biopsy cavity. Specimen radiograph was performed showing presence of calcifications in both specimens. Specimens with calcifications are identified for pathology. Lesion quadrant: Lower inner quadrant. Follow-up 2-view mammogram was performed and dictated separately. IMPRESSION: Stereotactic-guided biopsy of right breast lower inner quadrant calcifications, posterior and anterior extent. No apparent complications. Electronically Signed: By: Fidela Salisbury M.D. On: 07/07/2021 13:05   ASSESSMENT: DCIS, right breast.  PLAN:    DCIS, right breast: Patient's biopsy lesion is not healed and she was recently placed on antibiotics secondary to persistent drainage from that site.  She was noted to have a low-grade DCIS without invasive component and may qualify for the COMET trial.  She is being screened by the research RN today.  Follow-up will be based on whether or not patient enrolls in clinical trial and if she does, which arm she is on.  Either way, patient will benefit from tamoxifen for total of 5 years. Breast wound: Appreciate surgical input.  Continue antibiotics and follow-up with surgery as scheduled.  I spent a total of 60 minutes reviewing chart data, face-to-face evaluation with the patient, counseling and coordination of care as detailed above.  Patient expressed understanding and was in agreement with this plan. She also understands that She can call clinic at any time with any questions, concerns, or complaints.   Cancer Staging Ductal carcinoma in situ (DCIS) of right breast Staging form: Breast, AJCC 8th Edition - Clinical stage from 07/15/2021: Stage 0 (cTis (DCIS), cN0, cM0, ER+, PR+, HER2-) - Signed by  Lloyd Huger, MD on 07/15/2021 Stage prefix: Initial diagnosis Nuclear grade: Patrecia Pour, MD   07/18/2021 1:43 PM

## 2021-07-17 ENCOUNTER — Other Ambulatory Visit: Payer: Self-pay

## 2021-07-17 MED ORDER — SULFAMETHOXAZOLE-TRIMETHOPRIM 800-160 MG PO TABS
ORAL_TABLET | ORAL | 0 refills | Status: DC
  Filled 2021-07-17: qty 20, 10d supply, fill #0

## 2021-07-18 ENCOUNTER — Inpatient Hospital Stay: Payer: Self-pay

## 2021-07-18 ENCOUNTER — Other Ambulatory Visit: Payer: Self-pay

## 2021-07-18 ENCOUNTER — Inpatient Hospital Stay: Payer: Self-pay | Attending: Oncology | Admitting: Oncology

## 2021-07-18 ENCOUNTER — Encounter: Payer: Self-pay | Admitting: Medical Oncology

## 2021-07-18 DIAGNOSIS — F17210 Nicotine dependence, cigarettes, uncomplicated: Secondary | ICD-10-CM | POA: Insufficient documentation

## 2021-07-18 DIAGNOSIS — D0511 Intraductal carcinoma in situ of right breast: Secondary | ICD-10-CM | POA: Insufficient documentation

## 2021-07-18 NOTE — Progress Notes (Signed)
AFT - 25: COMPARING AN OPERATION TO MONITORING, WITH OR WITHOUT ENDOCRINE THERAPY (COMET) FOR LOW RISK DCIS: A PHASE III PROSPECTIVE RANDOMIZED TRIAL  Patient expressed interest in COMET after her surgeon and Dr. Grayland Ormond spoke to her about the study.  I met with patient, alone, this afternoon after her scheduled appointment with Dr. Grayland Ormond. Patient was provided with a description of the study, purpose of it, randomization process and the two groups, one of which patient would be randomized should she be eligible and choose to participate. Patient was provided with the study consent form, Version 5, dated 03/02/19, for her review. Patient denied having any questions at this time and we made plans for me to contact her Friday, 9/30, to answer any questions she may have. Patient was thanked for her time and interest in study, and provided with my contact information for any questions or concerns she may have.  Maxwell Marion, RN, BSN, [facility] Clinical Research 07/18/2021 2:36 PM

## 2021-07-18 NOTE — Progress Notes (Signed)
Pt states she has infection in right breast. Reports taking antibiotics for same. States malodorous fluid leaking from breast. Pt also states she is concerned that she breaks out in hives at work.

## 2021-07-21 ENCOUNTER — Telehealth: Payer: Self-pay | Admitting: Medical Oncology

## 2021-07-21 NOTE — Telephone Encounter (Signed)
AFT - 25: COMPARING AN OPERATION TO MONITORING, WITH OR WITHOUT ENDOCRINE THERAPY (COMET) FOR LOW RISK DCIS: A PHASE III PROSPECTIVE RANDOMIZED TRIAL   Outgoing call: 2:25 LVMOM with patient inquiring if she had any questions regarding the COMET study information provided to her on the 27th, when patient was in clinic for scheduled appointment. Patient thanked and encouraged to call, otherwise I will call again next week.   Maxwell Marion, RN, BSN, [facility] Clinical Research 07/21/2021 2:27 PM

## 2021-07-22 DIAGNOSIS — C50919 Malignant neoplasm of unspecified site of unspecified female breast: Secondary | ICD-10-CM

## 2021-07-22 HISTORY — DX: Malignant neoplasm of unspecified site of unspecified female breast: C50.919

## 2021-07-24 ENCOUNTER — Other Ambulatory Visit: Payer: Self-pay | Admitting: General Surgery

## 2021-07-24 DIAGNOSIS — D0511 Intraductal carcinoma in situ of right breast: Secondary | ICD-10-CM

## 2021-07-25 ENCOUNTER — Other Ambulatory Visit: Payer: Self-pay | Admitting: General Surgery

## 2021-07-25 NOTE — Progress Notes (Signed)
Subjective:     Patient ID: Cindy Burke is a 55 y.o. female.   HPI   The following portions of the patient's history were reviewed and updated as appropriate.   This a new patient is here today for: office visit. The patient has been referred today by Al Pimple, RN for evaluation of a newly diagnosed right breast cancer. She had a right stereotactic biopsy done on 07-07-21 at [facility]. She reports her mammogram prior to this 1 was in 2015.   Patient states she did not notice any breast changes prior to her recent mammogram. The patient states her bra size is a 48 G/H.   The patient was recently, within the last few weeks, diagnosed with diabetes and essential hypertension.   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   She has twins, a boy and a girl.  The boy has sickle cell disease, the girl sickle trait.        Chief Complaint  Patient presents with   Treatment Plan Discussion      BP 124/66   Pulse 75   Temp 36.5 C (97.7 F)   Ht 165.1 cm (5\' 5" )   Wt (!) 110.7 kg (244 lb)   SpO2 99%   BMI 40.60 kg/m        Past Medical History:  Diagnosis Date   Diabetes (CMS-HCC)     Hypertension             Past Surgical History:  Procedure Laterality Date   BREAST EXCISIONAL BIOPSY Right 07/07/2021    x 2                OB History     Gravida  1   Para  1   Term      Preterm      AB      Living         SAB      IAB      Ectopic      Molar      Multiple      Live Births           Obstetric Comments  Age at first period 38 Age of first pregnancy 22 Patient has twins             Social History           Socioeconomic History   Marital status: Single  Tobacco Use   Smoking status: Current Some Day Smoker      Types: Cigarettes   Smokeless tobacco: Never Used   Tobacco comment: Patient smokes 2 cigarettes a day.  Substance and Sexual  Activity   Alcohol use: Yes      Comment: occasionally   Drug use: Never        No Known Allergies   Current Medications        Current Outpatient Medications  Medication Sig Dispense Refill   amLODIPine (NORVASC) 10 MG tablet Take 10 mg by mouth once daily       atorvastatin (LIPITOR) 10 MG tablet Take 10 mg by mouth once daily       chlorthalidone 25 MG tablet Take 25 mg by mouth once daily       famotidine (PEPCID) 20 MG tablet Take 1 tablet by mouth once daily       lisinopriL (ZESTRIL)  20 MG tablet Take 1 tablet by mouth at bedtime       metFORMIN (GLUCOPHAGE) 500 MG tablet Take 1 tablet by mouth daily with breakfast       ondansetron (ZOFRAN-ODT) 4 MG disintegrating tablet Take by mouth        No current facility-administered medications for this visit.             Family History  Problem Relation Age of Onset   Breast cancer Neg Hx     Colon cancer Neg Hx          Labs and Radiology:        Sodium 135 - 145 mmol/L 136   Potassium 3.5 - 5.1 mmol/L 3.1 Low    Chloride 98 - 111 mmol/L 99   CO2 22 - 32 mmol/L 27   Glucose, Bld 70 - 99 mg/dL 110 High    Comment: Glucose reference range applies only to samples taken after fasting for at least 8 hours.  BUN 6 - 20 mg/dL 32 High    Creatinine, Ser 0.44 - 1.00 mg/dL 1.56 High    Calcium 8.9 - 10.3 mg/dL 9.3   GFR, Estimated >60 mL/min 39 Low      WBC 4.0 - 10.5 K/uL 9.4   RBC 3.87 - 5.11 MIL/uL 5.72 High    Hemoglobin 12.0 - 15.0 g/dL 13.1   HCT 36.0 - 46.0 % 40.7   MCV 80.0 - 100.0 fL 71.2 Low    MCH 26.0 - 34.0 pg 22.9 Low    MCHC 30.0 - 36.0 g/dL 32.2   RDW 11.5 - 15.5 % 14.9   Platelets 150 - 400 K/uL 239   nRBC 0.0 - 0.2 % 0.0       Mammogram review:   Mammograms of June 21, 2021 through July 07, 2021 were independently reviewed.  2015 studies were not available.   Calcifications in the lower inner quadrant of the right breast identified.     Biopsies of July 07, 2021: A. RIGHT  BREAST, LOWER INNER QUADRANT CALCIFICATIONS (POSTERIOR EXTENT);  STEREOTACTIC BIOPSY:  - DUCTAL CARCINOMA IN SITU, LOW GRADE, WITH ASSOCIATED CALCIFICATIONS.   B. RIGHT BREAST, LOWER INNER QUADRANT CALCIFICATIONS (ANTERIOR EXTENT);  STEREOTACTIC BIOPSY:  - DUCTAL CARCINOMA IN SITU, LOW GRADE, WITH ASSOCIATED CALCIFICATIONS.  CASE SUMMARY: BREAST BIOMARKER TESTS  Estrogen Receptor (ER) Status: POSITIVE          Percentage of cells with nuclear positivity: 90%          Average intensity of staining: Strong  July 05, 2021 laboratory:   Ultrasound:   Ultrasound examination of the area of swelling in the right breast was undertaken.  This shows a complex heterogeneous lesion measuring 1.18 x 1.6 x 2 cm.  Due to the tenderness and the suspicion for abscess formation the patient was amenable to aspiration.  This was completed after ChloraPrep for the skin and 1 cc of 1% plain Xylocaine.  Aspiration yielded about 5 cc of murky hematoma.  Significant decompression of the mass.  Noted on ultrasound R. Perry biopsy inflammatory changes.         Review of Systems  Constitutional: Negative for chills and fever.  Respiratory: Negative for cough.          Objective:   Physical Exam Exam conducted with a chaperone present.  Constitutional:      Appearance: Normal appearance.  Cardiovascular:     Rate and Rhythm: Normal rate and regular rhythm.  Pulses: Normal pulses.     Heart sounds: Normal heart sounds.  Pulmonary:     Effort: Pulmonary effort is normal.     Breath sounds: Normal breath sounds.  Chest:  Breasts:     Right: Mass present.     Left: Normal.      Musculoskeletal:     Cervical back: Neck supple.  Lymphadenopathy:     Upper Body:     Right upper body: No supraclavicular or axillary adenopathy.     Left upper body: No supraclavicular or axillary adenopathy.  Skin:    General: Skin is warm and dry.  Neurological:     Mental Status: She is alert and oriented to  person, place, and time.  Psychiatric:        Mood and Affect: Mood normal.        Behavior: Behavior normal.           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   Postbiopsy hematoma, possible early abscess.   Sickle cell trait.    Plan:     The patient will be placed on Bactrim DS 1 p.o. twice daily.  The patient's serum potassium is 3.2 with little risk of hyperkalemia with her present antihypertensive medication.   She is a candidate for participation in the Comet study.  The study plan observation with estrogen suppression versus surgery/radiation/estrogen suppression was reviewed.  Emphasized that participation was optional, but with this low-grade lesion certainly something to consider as she is newly diagnosed as diabetic.   Arrangements have been made by Al Pimple, RN at the cancer center for evaluation by the Comet protocol representative.   We will plan for a follow-up here in 2 weeks to assess her resolution of the inflammatory process at the biopsy site and discuss her plans for or against participation in the Comet trial.    This note is partially prepared by Ledell Noss, CMA acting as a scribe in the presence of Dr. Hervey Ard, MD.    The documentation recorded by the scribe accurately reflects the service I personally performed and the decisions made by me.    Robert Bellow, MD FACS

## 2021-07-27 ENCOUNTER — Encounter: Payer: Self-pay | Admitting: Gerontology

## 2021-07-27 ENCOUNTER — Inpatient Hospital Stay: Admission: RE | Admit: 2021-07-27 | Payer: Self-pay | Source: Ambulatory Visit

## 2021-07-27 ENCOUNTER — Ambulatory Visit: Payer: Self-pay | Admitting: Gerontology

## 2021-07-27 ENCOUNTER — Other Ambulatory Visit: Payer: Self-pay

## 2021-07-27 VITALS — BP 124/79 | HR 77 | Temp 97.4°F | Resp 20 | Ht 60.0 in | Wt 245.0 lb

## 2021-07-27 DIAGNOSIS — R944 Abnormal results of kidney function studies: Secondary | ICD-10-CM | POA: Insufficient documentation

## 2021-07-27 DIAGNOSIS — M25562 Pain in left knee: Secondary | ICD-10-CM | POA: Insufficient documentation

## 2021-07-27 DIAGNOSIS — S21001A Unspecified open wound of right breast, initial encounter: Secondary | ICD-10-CM

## 2021-07-27 DIAGNOSIS — S21009A Unspecified open wound of unspecified breast, initial encounter: Secondary | ICD-10-CM | POA: Insufficient documentation

## 2021-07-27 DIAGNOSIS — D0511 Intraductal carcinoma in situ of right breast: Secondary | ICD-10-CM

## 2021-07-27 DIAGNOSIS — L299 Pruritus, unspecified: Secondary | ICD-10-CM | POA: Insufficient documentation

## 2021-07-27 NOTE — Progress Notes (Signed)
Established Patient Office Visit  Subjective:  Patient ID: Cindy Burke, female    DOB: 1965/11/19  Age: 55 y.o. MRN: [id number]  CC:  Chief Complaint  Patient presents with   Follow-up   Hypertension    Taking prescribed antihypertensive medication    HPI Cindy Burke  is a 55 y/o female who has history of hypertension, pre diabetes present for routine follow up. Currently, she c/o intermittent itching and scattered wheels to her fore hands that started on Tuesday few days prior to staring Bactrim after seen Dr Grayland Ormond T on 07/18/21 for non healing biopsy site to her right breast. During visit she denies itching and the no wheels was observed on her fore arms.  She had mammogram done on 06/30/21 and it showed Suspicious right breast calcifications spanning approximately 3.5 cm. Recommendation is for stereotactic biopsy. She had Stereotactic guided biopsy of the right breast lower inner quadrant done on 07/07/21 and she was diagnosed with DCIS without invasive component. The biopsy site to her right breast had yellowish slough covering wound bed, draining minimal amount of seroanguinous exudate. She will follow up on 08/04/21 for Lumpectomy by Dr Bary Castilla J,W. She was seen at the ED on 07/05/21 for chest pain. EKG done was normal and chest X ray showed no evidence of acute cardiopulmonary disease. Antacid was prescribed to her .Currently, she states she continues to experience throbbing pain to left knee if she sits or stands for a long. She reports that taking Tylenol moderately relieves symptoms. She was also seen at the ED on 06/30/21 for left knee pain and imaging showed Degenerative changes LEFT knee with question calcified loose body at lateral suprapatellar recess versus soft tissue calcification. She had labs done 3 weeks during the ED visit , her eGFR was 39, Serum creatinine was 1.56. Overall, she states that she's doing well and offers no further complaint.  Past Medical History:   Diagnosis Date   Diabetes mellitus without complication ([place])    Headache    migraines   Hypertension    Passed out     Past Surgical History:  Procedure Laterality Date   BREAST BIOPSY Right 07/07/2021   stereo bx, ribbon clip, path pending   BREAST BIOPSY Right 07/07/2021   Stereo bx,X clip, path pending   NO PAST SURGERIES      Family History  Problem Relation Age of Onset   Hypertension Mother    Cancer Mother    Diabetes Mother    Hypertension Father    Heart attack Father    Diabetes Father    Stroke Brother    Sickle cell anemia Son    Breast cancer Neg Hx     Social History   Socioeconomic History   Marital status: Single    Spouse name: Not on file   Number of children: Not on file   Years of education: Not on file   Highest education level: Not on file  Occupational History   Occupation: Surveyor, quantity: [employer]  Tobacco Use   Smoking status: Every Day    Packs/day: 0.50    Years: 10.00    Pack years: 5.00    Types: Cigarettes   Smokeless tobacco: Never  Vaping Use   Vaping Use: Never used  Substance and Sexual Activity   Alcohol use: Not Currently    Alcohol/week: 1.0 - 2.0 standard drink    Types: 1 - 2 Shots of liquor per week   Drug  use: Yes    Types: Marijuana    Comment: w-1   Sexual activity: Not on file  Other Topics Concern   Not on file  Social History Narrative   Sometimes has trouble getting to work, relies on other people to take the because [employer] bus does not run far enough, also has trouble falling asleep occassionally   Social Determinants of Radio broadcast assistant Strain: Not on file  Food Insecurity: No Food Insecurity   Worried About Charity fundraiser in the Last Year: Never true   Arboriculturist in the Last Year: Never true  Transportation Needs: Public librarian (Medical): Yes   Lack of Transportation (Non-Medical): Yes  Physical Activity: Not on file  Stress: Not on  file  Social Connections: Not on file  Intimate Partner Violence: Not on file    Outpatient Medications Prior to Visit  Medication Sig Dispense Refill   amLODipine (NORVASC) 10 MG tablet Take 1 tablet (10 mg total) by mouth once daily. (Patient taking differently: Take 10 mg by mouth every morning.) 90 tablet 0   atorvastatin (LIPITOR) 10 MG tablet Take 1 tablet (10 mg total) by mouth once daily. (Patient taking differently: Take 10 mg by mouth every morning.) 90 tablet 0   chlorthalidone (HYGROTON) 25 MG tablet Take 1 tablet (25 mg total) by mouth once daily. (Patient taking differently: Take 25 mg by mouth every morning.) 90 tablet 0   famotidine (PEPCID) 20 MG tablet Take 1 tablet (20 mg total) by mouth once daily. (Patient taking differently: Take 20 mg by mouth every morning.) 30 tablet 1   lisinopril (ZESTRIL) 20 MG tablet TAKE ONE TABLET BY MOUTH ONCE DAILY AT BEDTIME 90 tablet 0   metFORMIN (GLUCOPHAGE) 500 MG tablet TAKE ONE TABLET BY MOUTH EVERY DAY WITH BREAKFAST 90 tablet 0   ondansetron (ZOFRAN ODT) 4 MG disintegrating tablet Dissolve 1 tablet (4 mg total) by mouth every 8 (eight) hours as needed for nausea or vomiting. (Patient taking differently: Take 4 mg by mouth every 8 (eight) hours as needed for nausea or vomiting. None in the last week) 20 tablet 0   sulfamethoxazole-trimethoprim (BACTRIM DS) 800-160 MG tablet Take 1 tablet (160 mg of trimethoprim total) by mouth every 12 (twelve) hours for 10 days (Patient not taking: Reported on 07/27/2021) 20 tablet 0   No facility-administered medications prior to visit.    Allergies  Allergen Reactions   Sulfamethoxazole-Trimethoprim     Breaks out in welts    ROS Review of Systems  Constitutional: Negative.   Respiratory: Negative.    Cardiovascular: Negative.   Skin:  Positive for wound (shallow wound to right breast with yellowish slough.).  Neurological: Negative.      Objective:    Physical Exam HENT:     Head:  Normocephalic and atraumatic.  Cardiovascular:     Rate and Rhythm: Normal rate and regular rhythm.     Pulses: Normal pulses.     Heart sounds: Normal heart sounds.  Pulmonary:     Effort: Pulmonary effort is normal.     Breath sounds: Normal breath sounds.  Skin:    Findings: Wound present.       Neurological:     General: No focal deficit present.     Mental Status: She is alert and oriented to person, place, and time. Mental status is at baseline.  Psychiatric:        Mood and  Affect: Mood normal.        Behavior: Behavior normal.        Thought Content: Thought content normal.        Judgment: Judgment normal.    BP 124/79 (BP Location: Right Arm, Patient Position: Sitting, Cuff Size: Large)   Pulse 77   Temp (!) 97.4 F (36.3 C)   Resp 20   Ht 5' (1.524 m)   Wt 245 lb (111.1 kg)   LMP  (LMP Unknown)   SpO2 98%   BMI 47.85 kg/m  Wt Readings from Last 3 Encounters:  07/27/21 245 lb (111.1 kg)  07/05/21 240 lb (108.9 kg)  06/30/21 246 lb (111.6 kg)   Weight loss encouraged  Health Maintenance Due  Topic Date Due   OPHTHALMOLOGY EXAM  Never done   Hepatitis C Screening  Never done   TETANUS/TDAP  Never done   Zoster Vaccines- Shingrix (1 of 2) Never done   COLONOSCOPY (Pts 45-37yr Insurance coverage will need to be confirmed)  Never done   COVID-19 Vaccine (2 - Moderna risk series) 01/29/2020    There are no preventive care reminders to display for this patient.  Lab Results  Component Value Date   TSH 1.490 06/04/2019   Lab Results  Component Value Date   WBC 9.4 07/05/2021   HGB 13.1 07/05/2021   HCT 40.7 07/05/2021   MCV 71.2 (L) 07/05/2021   PLT 239 07/05/2021   Lab Results  Component Value Date   NA 136 07/31/2021   K 2.9 (L) 07/31/2021   CO2 26 07/31/2021   GLUCOSE 129 (H) 07/31/2021   BUN 21 (H) 07/31/2021   CREATININE 0.98 07/31/2021   BILITOT 0.6 07/31/2021   ALKPHOS 69 07/31/2021   AST 19 07/31/2021   ALT 17 07/31/2021   PROT  7.6 07/31/2021   ALBUMIN 4.2 07/31/2021   CALCIUM 9.3 07/31/2021   ANIONGAP 10 07/31/2021   EGFR 80 06/28/2021   Lab Results  Component Value Date   CHOL 206 (H) 03/16/2021   Lab Results  Component Value Date   HDL 51 03/16/2021   Lab Results  Component Value Date   LDLCALC 122 (H) 03/16/2021   Lab Results  Component Value Date   TRIG 188 (H) 03/16/2021   Lab Results  Component Value Date   CHOLHDL 4.0 03/16/2021   Lab Results  Component Value Date   HGBA1C 5.5 06/28/2021      Assessment & Plan:   1. Left knee pain, unspecified chronicity - She was advised to continue taking 500 mg Tylenol every 8 hours as needed and not to exceed 4000 mg. She is to notify clinic for worsening symptoms.  2. Ductal carcinoma in situ (DCIS) of right breast - She was advised to follow up with Dr BTerri Piedraon 08/04/21 and to notify clinic.  3. Itching of both hands - She was advised to continue Bactrim antibiotics for right breast wound and to notify clinic or go to the ED for worsening symptoms.  4. Abnormal renal function test She was encouraged to increase water intake and will recheck renal function lab. - Comp Met (CMET); Future  5. Wound of right breast, initial encounter - She was encouraged to continue Bactrim antibiotics and dressing change. She was advised to notify clinic.     Follow-up: Return in about 27 days (around 08/23/2021), or if symptoms worsen or fail to improve.    Breylon Sherrow EJerold Coombe NP

## 2021-07-28 ENCOUNTER — Other Ambulatory Visit: Payer: Self-pay

## 2021-07-28 ENCOUNTER — Encounter
Admission: RE | Admit: 2021-07-28 | Discharge: 2021-07-28 | Disposition: A | Discharge status: Home / Self Care | Payer: Self-pay | Source: Ambulatory Visit | Attending: General Surgery | Admitting: General Surgery

## 2021-07-28 HISTORY — DX: Headache, unspecified: R51.9

## 2021-07-28 NOTE — Patient Instructions (Signed)
Your procedure is scheduled on:08-04-21 Friday Report to the Registration Desk on the 1st floor of the [facility].Then proceed to the 2nd floor Surgery Desk in the [facility] To find out your arrival time, please call [phone] between 1PM - 3PM on:08-03-21 Thursday  REMEMBER: Instructions that are not followed completely may result in serious medical risk, up to and including death; or upon the discretion of your surgeon and anesthesiologist your surgery may need to be rescheduled.  Do not eat food after midnight the night before surgery.  No gum chewing, lozengers or hard candies.  You may however, drink Water up to 2 hours before you are scheduled to arrive for your surgery. Do not drink anything within 2 hours of your scheduled arrival time.  Type 1 and Type 2 diabetics should only drink water.  TAKE THESE MEDICATIONS THE MORNING OF SURGERY WITH A SIP OF WATER: -amLODipine (NORVASC) 10 MG tablet -atorvastatin (LIPITOR) 10 MG tablet -famotidine (PEPCID) 20 MG tablet (take one the night before and one on the morning of surgery - helps to prevent nausea after surgery.)  Stop Metformin 2 days prior to Surgery-Last dose on 08-01-21 Tuesday  One week prior to surgery: Stop Anti-inflammatories (NSAIDS) such as Advil, Aleve, Ibuprofen, Motrin, Naproxen, Naprosyn and Aspirin based products such as Excedrin, Goodys Powder, BC Powder.You may however, continue to take Tylenol if needed for pain up until the day of surgery.  Stop ANY OVER THE COUNTER supplements/vitamins NOW (07-28-21)until after surgery.  No Alcohol for 24 hours before or after surgery.  No Smoking including e-cigarettes for 24 hours prior to surgery.  No chewable tobacco products for at least 6 hours prior to surgery.  No nicotine patches on the day of surgery.  Do not use any "recreational" drugs for at least a week prior to your surgery.  Please be advised that the combination of cocaine and anesthesia may have  negative outcomes, up to and including death. If you test positive for cocaine, your surgery will be cancelled.  On the morning of surgery brush your teeth with toothpaste and water, you may rinse your mouth with mouthwash if you wish. Do not swallow any toothpaste or mouthwash.  Use CHG Soap as directed on instruction sheet.  Do not wear jewelry, make-up, hairpins, clips or nail polish.  Do not wear lotions, powders, or perfumes.   Do not shave body from the neck down 48 hours prior to surgery just in case you cut yourself which could leave a site for infection.  Also, freshly shaved skin may become irritated if using the CHG soap.  Contact lenses, hearing aids and dentures may not be worn into surgery.  Do not bring valuables to the hospital. [facility] is not responsible for any missing/lost belongings or valuables.   Notify your doctor if there is any change in your medical condition (cold, fever, infection).  Wear comfortable clothing (specific to your surgery type) to the hospital.  After surgery, you can help prevent lung complications by doing breathing exercises.  Take deep breaths and cough every 1-2 hours. Your doctor may order a device called an Incentive Spirometer to help you take deep breaths. When coughing or sneezing, hold a pillow firmly against your incision with both hands. This is called "splinting." Doing this helps protect your incision. It also decreases belly discomfort.  If you are being admitted to the hospital overnight, leave your suitcase in the car. After surgery it may be brought to your room.  If you are being discharged the day of surgery, you will not be allowed to drive home. You will need a responsible adult (18 years or older) to drive you home and stay with you that night.   If you are taking public transportation, you will need to have a responsible adult (18 years or older) with you. Please confirm with your physician that it is acceptable  to use public transportation.   Please call the [facility]. at [phone] if you have any questions about these instructions.  Surgery Visitation Policy:  Patients undergoing a surgery or procedure may have one family member or support person with them as long as that person is not COVID-19 positive or experiencing its symptoms.  That person may remain in the waiting area during the procedure and may rotate out with other people.  Inpatient Visitation:    Visiting hours are 7 a.m. to 8 p.m. Up to two visitors ages 16+ are allowed at one time in a patient room. The visitors may rotate out with other people during the day. Visitors must check out when they leave, or other visitors will not be allowed. One designated support person may remain overnight. The visitor must pass COVID-19 screenings, use hand sanitizer when entering and exiting the patient's room and wear a mask at all times, including in the patient's room. Patients must also wear a mask when staff or their visitor are in the room. Masking is required regardless of vaccination status.

## 2021-07-31 ENCOUNTER — Other Ambulatory Visit: Payer: Self-pay

## 2021-07-31 ENCOUNTER — Encounter
Admission: RE | Admit: 2021-07-31 | Discharge: 2021-07-31 | Disposition: A | Discharge status: Home / Self Care | Payer: Medicaid Other | Source: Ambulatory Visit | Attending: General Surgery | Admitting: General Surgery

## 2021-07-31 ENCOUNTER — Other Ambulatory Visit
Admission: RE | Admit: 2021-07-31 | Discharge: 2021-07-31 | Disposition: A | Discharge status: Home / Self Care | Payer: Medicaid Other | Attending: Gerontology | Admitting: Gerontology

## 2021-07-31 DIAGNOSIS — R944 Abnormal results of kidney function studies: Secondary | ICD-10-CM | POA: Diagnosis not present

## 2021-07-31 DIAGNOSIS — Z01818 Encounter for other preprocedural examination: Secondary | ICD-10-CM | POA: Insufficient documentation

## 2021-07-31 LAB — COMPREHENSIVE METABOLIC PANEL
ALT: 17 U/L (ref 0–44)
AST: 19 U/L (ref 15–41)
Albumin: 4.2 g/dL (ref 3.5–5.0)
Alkaline Phosphatase: 69 U/L (ref 38–126)
Anion gap: 10 (ref 5–15)
BUN: 21 mg/dL — ABNORMAL HIGH (ref 6–20)
CO2: 26 mmol/L (ref 22–32)
Calcium: 9.3 mg/dL (ref 8.9–10.3)
Chloride: 100 mmol/L (ref 98–111)
Creatinine, Ser: 0.98 mg/dL (ref 0.44–1.00)
GFR, Estimated: >60 mL/min (ref >60)
Glucose, Bld: 129 mg/dL — ABNORMAL HIGH (ref 70–99)
Potassium: 2.9 mmol/L — ABNORMAL LOW (ref 3.5–5.1)
Sodium: 136 mmol/L (ref 135–145)
Total Bilirubin: 0.6 mg/dL (ref 0.3–1.2)
Total Protein: 7.6 g/dL (ref 6.5–8.1)

## 2021-08-01 ENCOUNTER — Other Ambulatory Visit: Payer: Self-pay | Admitting: Gerontology

## 2021-08-01 ENCOUNTER — Other Ambulatory Visit: Payer: Self-pay

## 2021-08-01 DIAGNOSIS — E876 Hypokalemia: Secondary | ICD-10-CM

## 2021-08-01 MED ORDER — POTASSIUM CHLORIDE CRYS ER 20 MEQ PO TBCR
20.0000 meq | EXTENDED_RELEASE_TABLET | Freq: Every day | ORAL | 0 refills | Status: DC
Start: 2021-08-01 — End: 2021-08-17
  Filled 2021-08-01: qty 10, 10d supply, fill #0

## 2021-08-01 NOTE — Progress Notes (Signed)
  [facility] Perioperative Services: Pre-Admission/Anesthesia Testing  Abnormal Lab Notification   Date: 08/01/21  Name: Cindy Burke MRN:   [id number]  Re: Abnormal labs noted during PAT appointment   Provider(s) Notified: Robert Bellow, MD and Antony Odea, NP Notification mode: Routed and/or faxed via [facility]   ABNORMAL LAB VALUE(S): Lab Results  Component Value Date   K 2.9 (L) 07/31/2021   Notes:  Patient is scheduled for a BREAST LUMPECTOMY WITH NEEDLE LOCALIZATION (Right) on 08/04/2021. In review of her EMR it is noted that patient is on a daily diuretic dose (chlorthalidone 25 mg). Patient with resulting HYPOkalemia as above. Will send to primary attending surgeon and PCP for review and optimization. Order placed to have K+ rechecked on the day of her procedure to ensure that she is at a safe level to proceed with the planned surgical intervention and associated anesthetic course.   This is a Community education officer; no formal response is required.  Honor Loh, MSN, APRN, FNP-C, CEN [facility]  Peri-operative Services Nurse Practitioner Phone: [phone] Fax: [phone] 08/01/21 8:26 AM

## 2021-08-02 ENCOUNTER — Other Ambulatory Visit: Payer: Self-pay

## 2021-08-04 ENCOUNTER — Other Ambulatory Visit: Payer: Self-pay

## 2021-08-04 ENCOUNTER — Ambulatory Visit: Payer: Medicaid Other | Admitting: Urgent Care

## 2021-08-04 ENCOUNTER — Ambulatory Visit
Admission: RE | Admit: 2021-08-04 | Discharge: 2021-08-04 | Disposition: A | Discharge status: Home / Self Care | Payer: Medicaid Other | Attending: General Surgery | Admitting: General Surgery

## 2021-08-04 ENCOUNTER — Ambulatory Visit
Admission: RE | Admit: 2021-08-04 | Discharge: 2021-08-04 | Disposition: A | Discharge status: Home / Self Care | Payer: Medicaid Other | Source: Ambulatory Visit | Attending: General Surgery | Admitting: General Surgery

## 2021-08-04 ENCOUNTER — Encounter
Admission: RE | Disposition: A | Discharge status: Home / Self Care | Payer: Self-pay | Source: Home / Self Care | Attending: General Surgery

## 2021-08-04 ENCOUNTER — Encounter: Payer: Self-pay | Admitting: General Surgery

## 2021-08-04 DIAGNOSIS — D0511 Intraductal carcinoma in situ of right breast: Secondary | ICD-10-CM | POA: Insufficient documentation

## 2021-08-04 DIAGNOSIS — F17210 Nicotine dependence, cigarettes, uncomplicated: Secondary | ICD-10-CM | POA: Insufficient documentation

## 2021-08-04 HISTORY — PX: BREAST LUMPECTOMY WITH NEEDLE LOCALIZATION: SHX5759

## 2021-08-04 LAB — GLUCOSE, CAPILLARY: Glucose-Capillary: 119 mg/dL — ABNORMAL HIGH (ref 70–99)

## 2021-08-04 LAB — POCT I-STAT, CHEM 8
BUN: 23 mg/dL — ABNORMAL HIGH (ref 6–20)
Calcium, Ion: 1.16 mmol/L (ref 1.15–1.40)
Chloride: 105 mmol/L (ref 98–111)
Creatinine, Ser: 0.8 mg/dL (ref 0.44–1.00)
Glucose, Bld: 104 mg/dL — ABNORMAL HIGH (ref 70–99)
HCT: 43 % (ref 36.0–46.0)
Hemoglobin: 14.6 g/dL (ref 12.0–15.0)
Potassium: 3.7 mmol/L (ref 3.5–5.1)
Sodium: 140 mmol/L (ref 135–145)
TCO2: 25 mmol/L (ref 22–32)

## 2021-08-04 LAB — URINE DRUG SCREEN, QUALITATIVE (ARMC ONLY)
Amphetamines, Ur Screen: NOT DETECTED
Barbiturates, Ur Screen: NOT DETECTED
Benzodiazepine, Ur Scrn: NOT DETECTED
Cannabinoid 50 Ng, Ur ~~LOC~~: POSITIVE — AB
Cocaine Metabolite,Ur ~~LOC~~: POSITIVE — AB
MDMA (Ecstasy)Ur Screen: NOT DETECTED
Methadone Scn, Ur: NOT DETECTED
Opiate, Ur Screen: NOT DETECTED
Phencyclidine (PCP) Ur S: NOT DETECTED
Tricyclic, Ur Screen: NOT DETECTED

## 2021-08-04 SURGERY — BREAST LUMPECTOMY WITH NEEDLE LOCALIZATION
Anesthesia: General | Site: Breast | Laterality: Right

## 2021-08-04 MED ORDER — STERILE WATER FOR IRRIGATION IR SOLN
Status: DC | PRN
  Administered 2021-08-04: 1000 mL

## 2021-08-04 MED ORDER — ORAL CARE MOUTH RINSE: 15.0000 mL | Freq: Once | OROMUCOSAL | Status: AC

## 2021-08-04 MED ORDER — LIDOCAINE HCL (CARDIAC) PF 100 MG/5ML IV SOSY
PREFILLED_SYRINGE | INTRAVENOUS | Status: DC | PRN
  Administered 2021-08-04: 100 mg via INTRAVENOUS

## 2021-08-04 MED ORDER — OXYCODONE HCL 5 MG/5ML PO SOLN
5.0000 mg | Freq: Once | ORAL | Status: AC | PRN
Start: 2021-08-04 — End: 2021-08-04

## 2021-08-04 MED ORDER — LIDOCAINE HCL (PF) 2 % IJ SOLN
INTRAMUSCULAR | Status: AC
  Filled 2021-08-04: qty 5

## 2021-08-04 MED ORDER — BUPIVACAINE-EPINEPHRINE (PF) 0.5% -1:200000 IJ SOLN
INTRAMUSCULAR | Status: AC
  Filled 2021-08-04: qty 30

## 2021-08-04 MED ORDER — MIDAZOLAM HCL 2 MG/2ML IJ SOLN
INTRAMUSCULAR | Status: AC
  Filled 2021-08-04: qty 2

## 2021-08-04 MED ORDER — DEXMEDETOMIDINE (PRECEDEX) IN NS 20 MCG/5ML (4 MCG/ML) IV SYRINGE
PREFILLED_SYRINGE | INTRAVENOUS | Status: DC | PRN
  Administered 2021-08-04 (×2): 8 ug via INTRAVENOUS

## 2021-08-04 MED ORDER — EPHEDRINE 5 MG/ML INJ
INTRAVENOUS | Status: AC
  Filled 2021-08-04: qty 5

## 2021-08-04 MED ORDER — PROPOFOL 10 MG/ML IV BOLUS
INTRAVENOUS | Status: AC
  Filled 2021-08-04: qty 20

## 2021-08-04 MED ORDER — DEXMEDETOMIDINE HCL IN NACL 200 MCG/50ML IV SOLN
INTRAVENOUS | Status: AC
  Filled 2021-08-04: qty 50

## 2021-08-04 MED ORDER — FENTANYL CITRATE (PF) 100 MCG/2ML IJ SOLN
INTRAMUSCULAR | Status: DC | PRN
  Administered 2021-08-04 (×4): 25 ug via INTRAVENOUS

## 2021-08-04 MED ORDER — ONDANSETRON HCL 4 MG/2ML IJ SOLN
INTRAMUSCULAR | Status: DC | PRN
  Administered 2021-08-04: 4 mg via INTRAVENOUS

## 2021-08-04 MED ORDER — ACETAMINOPHEN 10 MG/ML IV SOLN
INTRAVENOUS | Status: AC
  Filled 2021-08-04: qty 100

## 2021-08-04 MED ORDER — SUCCINYLCHOLINE CHLORIDE 200 MG/10ML IV SOSY
PREFILLED_SYRINGE | INTRAVENOUS | Status: AC
  Filled 2021-08-04: qty 10

## 2021-08-04 MED ORDER — ACETAMINOPHEN 10 MG/ML IV SOLN
INTRAVENOUS | Status: DC | PRN
  Administered 2021-08-04: 1000 mg via INTRAVENOUS

## 2021-08-04 MED ORDER — HYDROCODONE-ACETAMINOPHEN 5-325 MG PO TABS: 1.0000 | ORAL_TABLET | ORAL | 0 refills | Status: DC | PRN

## 2021-08-04 MED ORDER — CHLORHEXIDINE GLUCONATE 0.12 % MT SOLN
OROMUCOSAL | Status: AC
  Administered 2021-08-04: 15 mL via OROMUCOSAL
  Filled 2021-08-04: qty 15

## 2021-08-04 MED ORDER — MIDAZOLAM HCL 2 MG/2ML IJ SOLN
INTRAMUSCULAR | Status: DC | PRN
  Administered 2021-08-04: 1 mg via INTRAVENOUS

## 2021-08-04 MED ORDER — ONDANSETRON HCL 4 MG/2ML IJ SOLN
INTRAMUSCULAR | Status: AC
  Filled 2021-08-04: qty 2

## 2021-08-04 MED ORDER — OXYCODONE HCL 5 MG PO TABS
ORAL_TABLET | ORAL | Status: AC
  Administered 2021-08-04: 5 mg via ORAL
  Filled 2021-08-04: qty 1

## 2021-08-04 MED ORDER — CHLORHEXIDINE GLUCONATE CLOTH 2 % EX PADS: 6.0000 | MEDICATED_PAD | Freq: Once | CUTANEOUS | Status: DC

## 2021-08-04 MED ORDER — FENTANYL CITRATE (PF) 100 MCG/2ML IJ SOLN: 25.0000 ug | INTRAMUSCULAR | Status: DC | PRN

## 2021-08-04 MED ORDER — DIPHENHYDRAMINE HCL 50 MG/ML IJ SOLN
INTRAMUSCULAR | Status: DC | PRN
  Administered 2021-08-04: 12.5 mg via INTRAVENOUS

## 2021-08-04 MED ORDER — CHLORHEXIDINE GLUCONATE 0.12 % MT SOLN: 15.0000 mL | Freq: Once | OROMUCOSAL | Status: AC

## 2021-08-04 MED ORDER — BUPIVACAINE-EPINEPHRINE (PF) 0.5% -1:200000 IJ SOLN
INTRAMUSCULAR | Status: DC | PRN
  Administered 2021-08-04: 30 mL

## 2021-08-04 MED ORDER — SODIUM CHLORIDE 0.9 % IV SOLN: INTRAVENOUS | Status: DC

## 2021-08-04 MED ORDER — CEFAZOLIN SODIUM-DEXTROSE 2-4 GM/100ML-% IV SOLN
2.0000 g | INTRAVENOUS | Status: AC
  Administered 2021-08-04: 2 g via INTRAVENOUS

## 2021-08-04 MED ORDER — PROPOFOL 10 MG/ML IV BOLUS
INTRAVENOUS | Status: DC | PRN
  Administered 2021-08-04: 200 mg via INTRAVENOUS

## 2021-08-04 MED ORDER — OXYCODONE HCL 5 MG PO TABS: 5.0000 mg | ORAL_TABLET | Freq: Once | ORAL | Status: AC | PRN

## 2021-08-04 MED ORDER — CEFAZOLIN SODIUM-DEXTROSE 2-4 GM/100ML-% IV SOLN
INTRAVENOUS | Status: AC
  Filled 2021-08-04: qty 100

## 2021-08-04 MED ORDER — FENTANYL CITRATE (PF) 100 MCG/2ML IJ SOLN
INTRAMUSCULAR | Status: AC
  Filled 2021-08-04: qty 2

## 2021-08-04 SURGICAL SUPPLY — 44 items
APL PRP STRL LF DISP 70% ISPRP (MISCELLANEOUS) ×2
BINDER BREAST XXLRG (GAUZE/BANDAGES/DRESSINGS) ×2 IMPLANT
BLADE BOVIE TIP EXT 4 (BLADE) IMPLANT
BLADE SURG 15 STRL SS SAFETY (BLADE) ×4 IMPLANT
CHLORAPREP W/TINT 26 (MISCELLANEOUS) ×4 IMPLANT
CNTNR SPEC 2.5X3XGRAD LEK (MISCELLANEOUS)
CONT SPEC 4OZ STER OR WHT (MISCELLANEOUS)
CONT SPEC 4OZ STRL OR WHT (MISCELLANEOUS)
CONTAINER SPEC 2.5X3XGRAD LEK (MISCELLANEOUS) IMPLANT
COVER PROBE FLX POLY STRL (MISCELLANEOUS) ×2 IMPLANT
DEVICE DUBIN SPECIMEN MAMMOGRA (MISCELLANEOUS) ×2 IMPLANT
DRAPE LAPAROTOMY 100X77 ABD (DRAPES) ×2 IMPLANT
DRSG GAUZE FLUFF 36X18 (GAUZE/BANDAGES/DRESSINGS) ×2 IMPLANT
DRSG TELFA 3X8 NADH (GAUZE/BANDAGES/DRESSINGS) ×2 IMPLANT
DRSG TELFA 4X3 1S NADH ST (GAUZE/BANDAGES/DRESSINGS) ×4 IMPLANT
ELECT CAUTERY BLADE TIP 2.5 (TIP) ×2
ELECT REM PT RETURN 9FT ADLT (ELECTROSURGICAL) ×2
ELECTRODE CAUTERY BLDE TIP 2.5 (TIP) ×1 IMPLANT
ELECTRODE REM PT RTRN 9FT ADLT (ELECTROSURGICAL) ×1 IMPLANT
GAUZE 4X4 16PLY ~~LOC~~+RFID DBL (SPONGE) ×2 IMPLANT
GLOVE SURG ENC MOIS LTX SZ7.5 (GLOVE) ×2 IMPLANT
GLOVE SURG UNDER LTX SZ8 (GLOVE) ×2 IMPLANT
GOWN STRL REUS W/ TWL LRG LVL3 (GOWN DISPOSABLE) ×2 IMPLANT
GOWN STRL REUS W/TWL LRG LVL3 (GOWN DISPOSABLE) ×4
KIT TURNOVER KIT A (KITS) ×2 IMPLANT
LABEL OR SOLS (LABEL) ×2 IMPLANT
MANIFOLD NEPTUNE II (INSTRUMENTS) ×2 IMPLANT
MARGIN MAP 10MM (MISCELLANEOUS) ×2 IMPLANT
NEEDLE HYPO 22GX1.5 SAFETY (NEEDLE) ×2 IMPLANT
NEEDLE HYPO 25X1 1.5 SAFETY (NEEDLE) ×2 IMPLANT
PACK BASIN MINOR ARMC (MISCELLANEOUS) ×2 IMPLANT
RETRACTOR RING XSMALL (MISCELLANEOUS) IMPLANT
RTRCTR WOUND ALEXIS 13CM XS SH (MISCELLANEOUS)
STRIP CLOSURE SKIN 1/2X4 (GAUZE/BANDAGES/DRESSINGS) ×2 IMPLANT
SUT ETHILON 3-0 FS-10 30 BLK (SUTURE) ×2
SUT VIC AB 2-0 CT1 27 (SUTURE) ×4
SUT VIC AB 2-0 CT1 TAPERPNT 27 (SUTURE) ×2 IMPLANT
SUT VIC AB 4-0 FS2 27 (SUTURE) ×2 IMPLANT
SUTURE EHLN 3-0 FS-10 30 BLK (SUTURE) ×1 IMPLANT
SWABSTK COMLB BENZOIN TINCTURE (MISCELLANEOUS) ×2 IMPLANT
SYR 10ML LL (SYRINGE) ×2 IMPLANT
TAPE TRANSPORE STRL 2 31045 (GAUZE/BANDAGES/DRESSINGS) IMPLANT
WATER STERILE IRR 1000ML POUR (IV SOLUTION) IMPLANT
WATER STERILE IRR 500ML POUR (IV SOLUTION) ×2 IMPLANT

## 2021-08-04 NOTE — H&P (Signed)
Cindy Burke [id number] 07/08/1966     HPI: Patient with multiofocal DCIS for wide excision.   Medications Prior to Admission  Medication Sig Dispense Refill Last Dose   amLODipine (NORVASC) 10 MG tablet Take 1 tablet (10 mg total) by mouth once daily. (Patient taking differently: Take 10 mg by mouth every morning.) 90 tablet 0 08/04/2021   atorvastatin (LIPITOR) 10 MG tablet Take 1 tablet (10 mg total) by mouth once daily. (Patient taking differently: Take 10 mg by mouth every morning.) 90 tablet 0 08/03/2021   chlorthalidone (HYGROTON) 25 MG tablet Take 1 tablet (25 mg total) by mouth once daily. (Patient taking differently: Take 25 mg by mouth every morning.) 90 tablet 0 08/03/2021   famotidine (PEPCID) 20 MG tablet Take 1 tablet (20 mg total) by mouth once daily. (Patient taking differently: Take 20 mg by mouth every morning.) 30 tablet 1 08/04/2021   lisinopril (ZESTRIL) 20 MG tablet TAKE ONE TABLET BY MOUTH ONCE DAILY AT BEDTIME 90 tablet 0 08/03/2021   metFORMIN (GLUCOPHAGE) 500 MG tablet TAKE ONE TABLET BY MOUTH EVERY DAY WITH BREAKFAST 90 tablet 0 08/03/2021   ondansetron (ZOFRAN ODT) 4 MG disintegrating tablet Dissolve 1 tablet (4 mg total) by mouth every 8 (eight) hours as needed for nausea or vomiting. (Patient taking differently: Take 4 mg by mouth every 8 (eight) hours as needed for nausea or vomiting. None in the last week) 20 tablet 0 prn   potassium chloride SA (KLOR-CON) 20 MEQ tablet Take 1 tablet (20 mEq total) by mouth once daily. 10 tablet 0 08/04/2021   Allergies  Allergen Reactions   Sulfamethoxazole-Trimethoprim     Breaks out in welts   Past Medical History:  Diagnosis Date   Diabetes mellitus without complication (HCC)    Headache    migraines   Hypertension    Passed out    Past Surgical History:  Procedure Laterality Date   BREAST BIOPSY Right 07/07/2021   stereo bx, ribbon clip,  DUCTAL CARCINOMA IN SITU,   BREAST BIOPSY Right 07/07/2021   Stereo  bx,X clip,  DUCTAL CARCINOMA IN SITU   NO PAST SURGERIES     Social History   Socioeconomic History   Marital status: Single    Spouse name: Not on file   Number of children: Not on file   Years of education: Not on file   Highest education level: Not on file  Occupational History   Occupation: Surveyor, quantity: [employer]  Tobacco Use   Smoking status: Every Day    Packs/day: 0.50    Years: 10.00    Pack years: 5.00    Types: Cigarettes   Smokeless tobacco: Never  Vaping Use   Vaping Use: Never used  Substance and Sexual Activity   Alcohol use: Not Currently    Alcohol/week: 1.0 - 2.0 standard drink    Types: 1 - 2 Shots of liquor per week   Drug use: Yes    Types: Marijuana    Comment: w-1   Sexual activity: Not on file  Other Topics Concern   Not on file  Social History Narrative   Sometimes has trouble getting to work, relies on other people to take the because [employer] bus does not run far enough, also has trouble falling asleep occassionally   Social Determinants of Radio broadcast assistant Strain: Not on file  Food Insecurity: No Food Insecurity   Worried About Charity fundraiser in the Last Year: Never  true   Ran Out of Food in the Last Year: Never true  Transportation Needs: Unmet Transportation Needs   Lack of Transportation (Medical): Yes   Lack of Transportation (Non-Medical): Yes  Physical Activity: Not on file  Stress: Not on file  Social Connections: Not on file  Intimate Partner Violence: Not on file   Social History   Social History Narrative   Sometimes has trouble getting to work, relies on other people to take the because [employer] bus does not run far enough, also has trouble falling asleep occassionally     ROS: Negative.     PE: HEENT: Negative. Lungs: Clear. Cardio: RR.    Assessment/Plan:  Proceed with planned wide excision with wire localization.  Forest Gleason Jelan Batterton 08/04/2021

## 2021-08-04 NOTE — Anesthesia Preprocedure Evaluation (Addendum)
Anesthesia Evaluation  Patient identified by MRN, date of birth, ID band Patient awake    Reviewed: Allergy & Precautions, NPO status , Patient's Chart, lab work & pertinent test results  History of Anesthesia Complications Negative for: history of anesthetic complications  Airway Mallampati: III  TM Distance: >3 FB Neck ROM: full    Dental  (+) Chipped, Poor Dentition, Missing   Pulmonary sleep apnea , Current Smoker and Patient abstained from smoking.,    Pulmonary exam normal        Cardiovascular Exercise Tolerance: Good hypertension, (-) angina(-) Past MI and (-) DOE Normal cardiovascular exam     Neuro/Psych  Headaches, negative psych ROS   GI/Hepatic negative GI ROS, Neg liver ROS,   Endo/Other  diabetes, Type 2  Renal/GU      Musculoskeletal   Abdominal   Peds  Hematology negative hematology ROS (+)   Anesthesia Other Findings Past Medical History: No date: Diabetes mellitus without complication (HCC) No date: Headache     Comment:  migraines No date: Hypertension No date: Passed out  Past Surgical History: 07/07/2021: BREAST BIOPSY; Right     Comment:  stereo bx, ribbon clip,  DUCTAL CARCINOMA IN SITU, 07/07/2021: BREAST BIOPSY; Right     Comment:  Stereo bx,X clip,  DUCTAL CARCINOMA IN SITU No date: NO PAST SURGERIES  BMI    Body Mass Index: 43.22 kg/m      Reproductive/Obstetrics negative OB ROS                            Anesthesia Physical Anesthesia Plan  ASA: 3  Anesthesia Plan: General LMA   Post-op Pain Management:    Induction: Intravenous  PONV Risk Score and Plan: Dexamethasone, Ondansetron, Midazolam and Treatment may vary due to age or medical condition  Airway Management Planned: LMA  Additional Equipment:   Intra-op Plan:   Post-operative Plan: Extubation in OR  Informed Consent: I have reviewed the patients History and Physical, chart,  labs and discussed the procedure including the risks, benefits and alternatives for the proposed anesthesia with the patient or authorized representative who has indicated his/her understanding and acceptance.     Dental Advisory Given  Plan Discussed with: Anesthesiologist, CRNA and Surgeon  Anesthesia Plan Comments: (Patient consented for risks of anesthesia including but not limited to:  - adverse reactions to medications - damage to eyes, teeth, lips or other oral mucosa - nerve damage due to positioning  - sore throat or hoarseness - Damage to heart, brain, nerves, lungs, other parts of body or loss of life  Patient voiced understanding.)        Anesthesia Quick Evaluation

## 2021-08-04 NOTE — Anesthesia Procedure Notes (Signed)
Procedure Name: LMA Insertion Date/Time: 08/04/2021 9:39 AM Performed by: Debe Coder, CRNA Pre-anesthesia Checklist: Patient identified, Emergency Drugs available, Suction available and Patient being monitored Patient Re-evaluated:Patient Re-evaluated prior to induction Oxygen Delivery Method: Circle system utilized Preoxygenation: Pre-oxygenation with 100% oxygen Induction Type: IV induction Ventilation: Mask ventilation without difficulty LMA: LMA inserted LMA Size: 3.5 Number of attempts: 1 Placement Confirmation: ETT inserted through vocal cords under direct vision, positive ETCO2 and breath sounds checked- equal and bilateral Tube secured with: Tape Dental Injury: Teeth and Oropharynx as per pre-operative assessment

## 2021-08-04 NOTE — Anesthesia Postprocedure Evaluation (Signed)
Anesthesia Post Note  Patient: Cindy Burke  Procedure(s) Performed: BREAST LUMPECTOMY WITH NEEDLE LOCALIZATION (Right: Breast)  Patient location during evaluation: Endoscopy Anesthesia Type: General Level of consciousness: awake and alert Pain management: pain level controlled Vital Signs Assessment: post-procedure vital signs reviewed and stable Respiratory status: spontaneous breathing, nonlabored ventilation, respiratory function stable and patient connected to nasal cannula oxygen Cardiovascular status: blood pressure returned to baseline and stable Postop Assessment: no apparent nausea or vomiting Anesthetic complications: no   No notable events documented.   Last Vitals:  Vitals:   08/04/21 1100 08/04/21 1110  BP: (!) 143/87 (!) 147/74  Pulse: 65 71  Resp: 12 14  Temp: (!) 36.2 C (!) 36.1 C  SpO2: 100% 100%    Last Pain:  Vitals:   08/04/21 1123  TempSrc:   PainSc: 4                  Precious Haws Anginette Espejo

## 2021-08-04 NOTE — Discharge Instructions (Signed)
AMBULATORY SURGERY  ?DISCHARGE INSTRUCTIONS ? ? ?The drugs that you were given will stay in your system until tomorrow so for the next 24 hours you should not: ? ?Drive an automobile ?Make any legal decisions ?Drink any alcoholic beverage ? ? ?You may resume regular meals tomorrow.  Today it is better to start with liquids and gradually work up to solid foods. ? ?You may eat anything you prefer, but it is better to start with liquids, then soup and crackers, and gradually work up to solid foods. ? ? ?Please notify your doctor immediately if you have any unusual bleeding, trouble breathing, redness and pain at the surgery site, drainage, fever, or pain not relieved by medication. ? ? ? ?Additional Instructions: ? ? ? ?Please contact your physician with any problems or Same Day Surgery at [phone], Monday through Friday 6 am to 4 pm, or [place] [facility] [place] Main number at [phone].  ?

## 2021-08-04 NOTE — Op Note (Signed)
Preoperative diagnosis: DCIS right breast.  Postoperative diagnosis: Same.  Operative procedure: Right breast wide excision with wire and ultrasound guidance  Operating Surgeon: Hervey Ard, MD.  Anesthesia: General by LMA, Marcaine 0.5% with 1: 200,000 units of epinephrine, 30 cc.  Estimated blood loss: 5 cc.  Clinical note: This 55 year old woman had new mammographic finding earlier this year with biopsy showing evidence of low-grade DCIS.  She declined participation in the [facility] trial.  She underwent wire localization the morning of surgery.  The prior abscess that had been drained post biopsy was improving well and allowed surgery to proceed.  The patient received Ancef prior to the procedure.  SCD stockings for DVT prevention.  Operative note: The breast was cleansed with Betadine solution and draped.  An elliptical incision was outlined at the 3 o'clock position to encompass the area of healing tissue from her prior abscess as well as both localizing wires.  Local anesthesia was infiltrated for postoperative comfort.  This was done well away from the planned incision.  The skin was incised sharply and remaining dissection completed with electrocautery.  A block of tissue 4 x 8 x 5 cm in size was excised orientated and specimen radiograph confirmed both wire tips and both clips.  Good hemostasis was noted.  The wound was closed in layers with a running 2-0 Vicryl x2.  The skin was closed with a running 4-0 Vicryl subcuticular suture.  Benzoin, Steri-Strips, Telfa, fluff gauze and a compressive wrap were applied.  Patient tolerated the procedure well and was taken to recovery in stable condition.

## 2021-08-04 NOTE — Transfer of Care (Signed)
Immediate Anesthesia Transfer of Care Note  Patient: Cindy Burke  Procedure(s) Performed: BREAST LUMPECTOMY WITH NEEDLE LOCALIZATION (Right: Breast)  Patient Location: PACU  Anesthesia Type:General  Level of Consciousness: awake, oriented, drowsy and patient cooperative  Airway & Oxygen Therapy: Patient Spontanous Breathing and Patient connected to face mask oxygen  Post-op Assessment: Report given to RN and Post -op Vital signs reviewed and stable  Post vital signs: Reviewed and stable  Last Vitals:  Vitals Value Taken Time  BP 126/83 08/04/21 1045  Temp 36.2 C 08/04/21 1033  Pulse 64 08/04/21 1050  Resp 14 08/04/21 1050  SpO2 100 % 08/04/21 1050    Last Pain:  Vitals:   08/04/21 1033  TempSrc:   PainSc: Asleep         Complications: No notable events documented.

## 2021-08-07 ENCOUNTER — Other Ambulatory Visit: Payer: Self-pay | Admitting: General Surgery

## 2021-08-07 LAB — SURGICAL PATHOLOGY

## 2021-08-11 ENCOUNTER — Other Ambulatory Visit (HOSPITAL_COMMUNITY): Payer: Self-pay

## 2021-08-11 NOTE — Progress Notes (Signed)
[facility]  [phone] [phone]  ID: [name] OB: December 03, 1965  MR#: [id number]  UYQ#:[id number]  Patient Care Team: Langston Reusing, NP as PCP - General (Gerontology) Rico Junker, RN as Registered Nurse Theodore Demark, RN as Registered Nurse Theodore Demark, RN as Oncology Nurse Navigator  CHIEF COMPLAINT: ER positive DCIS, right breast.  INTERVAL HISTORY: Patient returns to clinic today for further evaluation, discussion of her final pathology results, and treatment plan.  She tolerated her lumpectomy well without significant side effects.  She currently feels well and is asymptomatic. She has no neurologic complaints.  She denies any recent fevers or illnesses.  She has a good appetite and denies weight loss.  She has no chest pain, shortness of breath, cough, or hemoptysis.  She denies any nausea, vomiting, constipation, or diarrhea.  She has no urinary complaints.  Patient offers no specific complaints today.  REVIEW OF SYSTEMS:   Review of Systems  Constitutional: Negative.  Negative for fever, malaise/fatigue and weight loss.  Respiratory: Negative.  Negative for cough, hemoptysis and shortness of breath.   Cardiovascular: Negative.  Negative for chest pain and leg swelling.  Gastrointestinal: Negative.  Negative for abdominal pain.  Genitourinary: Negative.  Negative for dysuria.  Musculoskeletal: Negative.  Negative for back pain.  Skin: Negative.  Negative for rash.  Neurological: Negative.  Negative for dizziness, focal weakness, weakness and headaches.  Psychiatric/Behavioral: Negative.  The patient is not nervous/anxious.    As per HPI. Otherwise, a complete review of systems is negative.  PAST MEDICAL HISTORY: Past Medical History:  Diagnosis Date   Diabetes mellitus without complication ([place])    Headache    migraines   Hypertension    Passed out     PAST SURGICAL HISTORY: Past Surgical History:   Procedure Laterality Date   BREAST BIOPSY Right 07/07/2021   stereo bx, ribbon clip,  DUCTAL CARCINOMA IN SITU,   BREAST BIOPSY Right 07/07/2021   Stereo bx,X clip,  DUCTAL CARCINOMA IN SITU   BREAST LUMPECTOMY WITH NEEDLE LOCALIZATION Right 08/04/2021   Procedure: BREAST LUMPECTOMY WITH NEEDLE LOCALIZATION;  Surgeon: Robert Bellow, MD;  Location: [facility] ORS;  Service: General;  Laterality: Right;   NO PAST SURGERIES      FAMILY HISTORY: Family History  Problem Relation Age of Onset   Hypertension Mother    Cancer Mother    Diabetes Mother    Hypertension Father    Heart attack Father    Diabetes Father    Stroke Brother    Sickle cell anemia Son    Breast cancer Neg Hx     ADVANCED DIRECTIVES (Y/N):  N  HEALTH MAINTENANCE: Social History   Tobacco Use   Smoking status: Every Day    Packs/day: 0.50    Years: 10.00    Pack years: 5.00    Types: Cigarettes   Smokeless tobacco: Never  Vaping Use   Vaping Use: Never used  Substance Use Topics   Alcohol use: Not Currently    Alcohol/week: 1.0 - 2.0 standard drink    Types: 1 - 2 Shots of liquor per week   Drug use: Yes    Types: Marijuana    Comment: w-1     Colonoscopy:  PAP:  Bone density:  Lipid panel:  Allergies  Allergen Reactions   Sulfamethoxazole-Trimethoprim     Breaks out in welts    Current Outpatient Medications  Medication Sig Dispense Refill   amLODipine (NORVASC)  10 MG tablet Take 1 tablet (10 mg total) by mouth once daily. (Patient taking differently: Take 10 mg by mouth every morning.) 90 tablet 0   atorvastatin (LIPITOR) 10 MG tablet Take 1 tablet (10 mg total) by mouth once daily. (Patient taking differently: Take 10 mg by mouth every morning.) 90 tablet 0   chlorthalidone (HYGROTON) 25 MG tablet Take 1 tablet (25 mg total) by mouth once daily. (Patient taking differently: Take 25 mg by mouth every morning.) 90 tablet 0   famotidine (PEPCID) 20 MG tablet Take 1 tablet (20 mg total) by  mouth once daily. (Patient taking differently: Take 20 mg by mouth every morning.) 30 tablet 1   HYDROcodone-acetaminophen (NORCO/VICODIN) 5-325 MG tablet Take 1 tablet by mouth every 4 (four) hours as needed for moderate pain. 20 tablet 0   lisinopril (ZESTRIL) 20 MG tablet TAKE ONE TABLET BY MOUTH ONCE DAILY AT BEDTIME 90 tablet 0   metFORMIN (GLUCOPHAGE) 500 MG tablet TAKE ONE TABLET BY MOUTH EVERY DAY WITH BREAKFAST 90 tablet 0   ondansetron (ZOFRAN ODT) 4 MG disintegrating tablet Dissolve 1 tablet (4 mg total) by mouth every 8 (eight) hours as needed for nausea or vomiting. (Patient taking differently: Take 4 mg by mouth every 8 (eight) hours as needed for nausea or vomiting. None in the last week) 20 tablet 0   potassium chloride SA (KLOR-CON) 20 MEQ tablet Take 1 tablet (20 mEq total) by mouth once daily. 10 tablet 0   No current facility-administered medications for this visit.    OBJECTIVE: Vitals:   08/17/21 1026  BP: 133/78  Pulse: 65  Resp: 18  Temp: 97.7 F (36.5 C)  SpO2: 100%     Body mass index is 43.4 kg/m.    ECOG FS:0 - Asymptomatic  General: Well-developed, well-nourished, no acute distress. Eyes: Pink conjunctiva, anicteric sclera. HEENT: Normocephalic, moist mucous membranes. Breast: Right lumpectomy.  Exam deferred today. Lungs: No audible wheezing or coughing. Heart: Regular rate and rhythm. Abdomen: Soft, nontender, no obvious distention. Musculoskeletal: No edema, cyanosis, or clubbing. Neuro: Alert, answering all questions appropriately. Cranial nerves grossly intact. Skin: No rashes or petechiae noted. Psych: Normal affect.   LAB RESULTS:  Lab Results  Component Value Date   NA 140 08/04/2021   K 3.7 08/04/2021   CL 105 08/04/2021   CO2 26 07/31/2021   GLUCOSE 104 (H) 08/04/2021   BUN 23 (H) 08/04/2021   CREATININE 0.80 08/04/2021   CALCIUM 9.3 07/31/2021   PROT 7.6 07/31/2021   ALBUMIN 4.2 07/31/2021   AST 19 07/31/2021   ALT 17  07/31/2021   ALKPHOS 69 07/31/2021   BILITOT 0.6 07/31/2021   GFRNONAA >60 07/31/2021   GFRAA 86 06/22/2020    Lab Results  Component Value Date   WBC 9.4 07/05/2021   NEUTROABS 4.1 04/28/2018   HGB 14.6 08/04/2021   HCT 43.0 08/04/2021   MCV 71.2 (L) 07/05/2021   PLT 239 07/05/2021     STUDIES: MM Breast Surgical Specimen  Result Date: 08/04/2021 CLINICAL DATA:  Evaluate specimen EXAM: SPECIMEN RADIOGRAPH OF THE RIGHT BREAST COMPARISON:  Previous exam(s). FINDINGS: Status post excision of the right breast. The wire tips and biopsy marker clips are present and are marked for pathology. IMPRESSION: Specimen radiograph of the right breast. Electronically Signed   By: Dorise Bullion III M.D.   On: 08/04/2021 10:31  MM RT PLC BREAST LOC DEV   1ST LESION  INC MAMMO GUIDE  Result Date: 08/04/2021 CLINICAL  DATA:  Bracket localization of known DCIS. EXAM: NEEDLE LOCALIZATION OF THE RIGHT BREAST WITH MAMMO GUIDANCE COMPARISON:  Previous exams. PROCEDURE: Patient presents for needle localization prior to surgery. I met with the patient and we discussed the procedure of needle localization including benefits and alternatives. We discussed the high likelihood of a successful procedure. We discussed the risks of the procedure, including infection, bleeding, tissue injury, and further surgery. Informed, written consent was given. The usual time-out protocol was performed immediately prior to the procedure. Using mammographic guidance, sterile technique, 1% lidocaine and two 5 cm modified Kopans needles, the anterior and posterior aspect of the known DCIS was localized using medial approach. IMPRESSION: Needle localization right breast. No apparent complications. Electronically Signed   By: Dorise Bullion III M.D.   On: 08/04/2021 08:47  MM RT PLC BREAST LOC DEV   EA ADD LESION  INC MAMMO GUIDE  Result Date: 08/04/2021 CLINICAL DATA:  Bracket localization of known DCIS. EXAM: NEEDLE LOCALIZATION  OF THE RIGHT BREAST WITH MAMMO GUIDANCE COMPARISON:  Previous exams. PROCEDURE: Patient presents for needle localization prior to surgery. I met with the patient and we discussed the procedure of needle localization including benefits and alternatives. We discussed the high likelihood of a successful procedure. We discussed the risks of the procedure, including infection, bleeding, tissue injury, and further surgery. Informed, written consent was given. The usual time-out protocol was performed immediately prior to the procedure. Using mammographic guidance, sterile technique, 1% lidocaine and two 5 cm modified Kopans needles, the anterior and posterior aspect of the known DCIS was localized using medial approach. IMPRESSION: Needle localization right breast. No apparent complications. Electronically Signed   By: Dorise Bullion III M.D.   On: 08/04/2021 08:47   ASSESSMENT: ER positive DCIS, right breast.  PLAN:    ER positive DCIS, right breast: Lumpectomy completed on August 04, 2021 confirmed the above-stated diagnosis with no invasive component noted.  Because of that, patient does not require adjuvant chemotherapy.  She has an appointment next week with radiation oncology to discuss adjuvant XRT.  She will require tamoxifen for 5 years at the conclusion of her treatment.  Return to clinic at the end of radiation for further evaluation and initiation of treatment.    I spent a total of 30 minutes reviewing chart data, face-to-face evaluation with the patient, counseling and coordination of care as detailed above.  Patient expressed understanding and was in agreement with this plan. She also understands that She can call clinic at any time with any questions, concerns, or complaints.   Cancer Staging Ductal carcinoma in situ (DCIS) of right breast Staging form: Breast, AJCC 8th Edition - Clinical stage from 07/15/2021: Stage 0 (cTis (DCIS), cN0, cM0, ER+, PR+, HER2-) - Signed by Lloyd Huger, MD on 07/15/2021 Stage prefix: Initial diagnosis Nuclear grade: Patrecia Pour, MD   08/17/2021 10:46 AM

## 2021-08-14 NOTE — Progress Notes (Signed)
Case Conference discussion.  Scheduled patient for radiation consult 08/21/21.  Patient notified.

## 2021-08-16 ENCOUNTER — Other Ambulatory Visit: Payer: Self-pay

## 2021-08-17 ENCOUNTER — Other Ambulatory Visit: Payer: Self-pay

## 2021-08-17 ENCOUNTER — Inpatient Hospital Stay: Payer: Medicaid Other | Attending: Oncology | Admitting: Oncology

## 2021-08-17 VITALS — BP 133/78 | HR 65 | Temp 97.7°F | Resp 18 | Wt 245.0 lb

## 2021-08-17 DIAGNOSIS — D0511 Intraductal carcinoma in situ of right breast: Secondary | ICD-10-CM

## 2021-08-17 DIAGNOSIS — Z170 Estrogen receptor positive status [ER+]: Secondary | ICD-10-CM | POA: Diagnosis not present

## 2021-08-17 DIAGNOSIS — F1210 Cannabis abuse, uncomplicated: Secondary | ICD-10-CM | POA: Diagnosis not present

## 2021-08-17 DIAGNOSIS — F17210 Nicotine dependence, cigarettes, uncomplicated: Secondary | ICD-10-CM | POA: Diagnosis not present

## 2021-08-17 DIAGNOSIS — E876 Hypokalemia: Secondary | ICD-10-CM

## 2021-08-17 MED ORDER — POTASSIUM CHLORIDE CRYS ER 20 MEQ PO TBCR
20.0000 meq | EXTENDED_RELEASE_TABLET | Freq: Every day | ORAL | 0 refills | Status: DC
Start: 2021-08-17 — End: 2021-12-05
  Filled 2021-08-17: qty 10, 10d supply, fill #0

## 2021-08-17 NOTE — Progress Notes (Signed)
Pt has no concerns/complaints at this time. 

## 2021-08-18 ENCOUNTER — Other Ambulatory Visit: Payer: Self-pay

## 2021-08-21 ENCOUNTER — Other Ambulatory Visit: Payer: Self-pay

## 2021-08-21 ENCOUNTER — Encounter: Payer: Self-pay | Admitting: Radiation Oncology

## 2021-08-21 ENCOUNTER — Ambulatory Visit
Admission: RE | Admit: 2021-08-21 | Discharge: 2021-08-21 | Disposition: A | Discharge status: Home / Self Care | Payer: Medicaid Other | Source: Ambulatory Visit | Attending: Radiation Oncology | Admitting: Radiation Oncology

## 2021-08-21 VITALS — BP 133/78 | HR 79 | Temp 97.2°F | Resp 20 | Wt 244.5 lb

## 2021-08-21 DIAGNOSIS — Z7984 Long term (current) use of oral hypoglycemic drugs: Secondary | ICD-10-CM | POA: Insufficient documentation

## 2021-08-21 DIAGNOSIS — E119 Type 2 diabetes mellitus without complications: Secondary | ICD-10-CM | POA: Insufficient documentation

## 2021-08-21 DIAGNOSIS — Z809 Family history of malignant neoplasm, unspecified: Secondary | ICD-10-CM | POA: Diagnosis not present

## 2021-08-21 DIAGNOSIS — F17210 Nicotine dependence, cigarettes, uncomplicated: Secondary | ICD-10-CM | POA: Diagnosis not present

## 2021-08-21 DIAGNOSIS — I10 Essential (primary) hypertension: Secondary | ICD-10-CM | POA: Diagnosis not present

## 2021-08-21 DIAGNOSIS — Z79899 Other long term (current) drug therapy: Secondary | ICD-10-CM | POA: Insufficient documentation

## 2021-08-21 DIAGNOSIS — Z170 Estrogen receptor positive status [ER+]: Secondary | ICD-10-CM | POA: Diagnosis not present

## 2021-08-21 DIAGNOSIS — D0511 Intraductal carcinoma in situ of right breast: Secondary | ICD-10-CM

## 2021-08-21 NOTE — Consult Note (Signed)
NEW PATIENT EVALUATION  Name: Cindy Burke  MRN: [id number]  Date:   08/21/2021     DOB: 06-30-1966   This 55 y.o. 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  REFERRING PHYSICIAN: Iloabachie, Chioma E, NP  CHIEF COMPLAINT:  Chief Complaint  Patient presents with   New Patient (Initial Visit)    Consult breast    DIAGNOSIS: The encounter diagnosis was Ductal carcinoma in situ (DCIS) of right breast.   PREVIOUS INVESTIGATIONS:  Mammogram and ultrasound reviewed Clinical notes reviewed Pathology report reviewed  HPI: Patient is a 55 year old female who presented with an abnormal mammogram of her right breast.  Initially there were calcifications in the right breast warranting further evaluation.  Further imaging revealed fine linear pleomorphic calcifications spanning approximately 3.5 cm in the central medial right breast at mid to anterior depth with suspicious morphology for malignancy.  She underwent stereotactic biopsy which was positive for ductal carcinoma in situ ER positive.  She then underwent a wide local excision of the right breast showing a nuclear grade 1 ductal carcinoma in situ with clear margins at 12 mm.  She has done well postoperatively.  Specifically denies breast tenderness cough or bone pain.  She has been seen by medical oncology is now referred to radiation collagen for consideration of treatment.  Patient is extremely large and pendulous breast.  PLANNED treatment right whole breast radiation  PAST MEDICAL HISTORY:  has a past medical history of Diabetes mellitus without complication ([place]), Headache, Hypertension, and Passed out.    PAST SURGICAL HISTORY:  Past Surgical History:  Procedure Laterality Date   BREAST BIOPSY Right 07/07/2021   stereo bx, ribbon clip,  DUCTAL CARCINOMA IN SITU,   BREAST BIOPSY Right 07/07/2021    Stereo bx,X clip,  DUCTAL CARCINOMA IN SITU   BREAST LUMPECTOMY WITH NEEDLE LOCALIZATION Right 08/04/2021   Procedure: BREAST LUMPECTOMY WITH NEEDLE LOCALIZATION;  Surgeon: Robert Bellow, MD;  Location: [facility] ORS;  Service: General;  Laterality: Right;   NO PAST SURGERIES      FAMILY HISTORY: family history includes Cancer in her mother; Diabetes in her father and mother; Heart attack in her father; Hypertension in her father and mother; Sickle cell anemia in her son; Stroke in her brother.  SOCIAL HISTORY:  reports that she has been smoking cigarettes. She has a 5.00 pack-year smoking history. She has never used smokeless tobacco. She reports that she does not currently use alcohol after a past usage of about 1.0 - 2.0 standard drink per week. She reports current drug use. Drug: Marijuana.  ALLERGIES: Sulfamethoxazole-trimethoprim  MEDICATIONS:  Current Outpatient Medications  Medication Sig Dispense Refill   amLODipine (NORVASC) 10 MG tablet Take 1 tablet (10 mg total) by mouth once daily. (Patient taking differently: Take 10 mg by mouth every morning.) 90 tablet 0   atorvastatin (LIPITOR) 10 MG tablet Take 1 tablet (10 mg total) by mouth once daily. (Patient taking differently: Take 10 mg by mouth every morning.) 90 tablet 0   chlorthalidone (HYGROTON) 25 MG tablet Take 1 tablet (25 mg total) by mouth once daily. (Patient taking differently: Take 25 mg by mouth every morning.) 90 tablet 0   famotidine (PEPCID) 20 MG tablet Take 1 tablet (20 mg total) by mouth once daily. (Patient taking differently: Take 20 mg by mouth every morning.) 30 tablet 1  HYDROcodone-acetaminophen (NORCO/VICODIN) 5-325 MG tablet Take 1 tablet by mouth every 4 (four) hours as needed for moderate pain. 20 tablet 0   lisinopril (ZESTRIL) 20 MG tablet TAKE ONE TABLET BY MOUTH ONCE DAILY AT BEDTIME 90 tablet 0   metFORMIN (GLUCOPHAGE) 500 MG tablet TAKE ONE TABLET BY MOUTH EVERY DAY WITH BREAKFAST 90 tablet 0    ondansetron (ZOFRAN ODT) 4 MG disintegrating tablet Dissolve 1 tablet (4 mg total) by mouth every 8 (eight) hours as needed for nausea or vomiting. (Patient taking differently: Take 4 mg by mouth every 8 (eight) hours as needed for nausea or vomiting. None in the last week) 20 tablet 0   potassium chloride SA (KLOR-CON) 20 MEQ tablet Take 1 tablet (20 mEq total) by mouth once daily. 10 tablet 0   No current facility-administered medications for this encounter.    ECOG PERFORMANCE STATUS:  0 - Asymptomatic  REVIEW OF SYSTEMS: Patient denies any weight loss, fatigue, weakness, fever, chills or night sweats. Patient denies any loss of vision, blurred vision. Patient denies any ringing  of the ears or hearing loss. No irregular heartbeat. Patient denies heart murmur or history of fainting. Patient denies any chest pain or pain radiating to her upper extremities. Patient denies any shortness of breath, difficulty breathing at night, cough or hemoptysis. Patient denies any swelling in the lower legs. Patient denies any nausea vomiting, vomiting of blood, or coffee ground material in the vomitus. Patient denies any stomach pain. Patient states has had normal bowel movements no significant constipation or diarrhea. Patient denies any dysuria, hematuria or significant nocturia. Patient denies any problems walking, swelling in the joints or loss of balance. Patient denies any skin changes, loss of hair or loss of weight. Patient denies any excessive worrying or anxiety or significant depression. Patient denies any problems with insomnia. Patient denies excessive thirst, polyuria, polydipsia. Patient denies any swollen glands, patient denies easy bruising or easy bleeding. Patient denies any recent infections, allergies or URI. Patient "s visual fields have not changed significantly in recent time.   PHYSICAL EXAM: BP 133/78 (BP Location: Right Arm, Patient Position: Sitting, Cuff Size: Large)   Pulse 79   Temp  (!) 97.2 F (36.2 C) (Tympanic)   Resp 20   Wt 244 lb 8 oz (110.9 kg)   LMP  (LMP Unknown)   SpO2 100%   BMI 43.31 kg/m  Patient has extremely large and pendulous breasts.  She is status post wide local excision of the right breast which is healing well.  No dominant masses noted in either breast.  No axillary or supraclavicular adenopathy is appreciated.  Well-developed well-nourished patient in NAD. HEENT reveals PERLA, EOMI, discs not visualized.  Oral cavity is clear. No oral mucosal lesions are identified. Neck is clear without evidence of cervical or supraclavicular adenopathy. Lungs are clear to A&P. Cardiac examination is essentially unremarkable with regular rate and rhythm without murmur rub or thrill. Abdomen is benign with no organomegaly or masses noted. Motor sensory and DTR levels are equal and symmetric in the upper and lower extremities. Cranial nerves II through XII are grossly intact. Proprioception is intact. No peripheral adenopathy or edema is identified. No motor or sensory levels are noted. Crude visual fields are within normal range.  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  Extremely large and pendulous breasts making hypofractionated course of treatment difficult with.  Would plan on delivering 5040 cGy in 28 fractions.  Would also boost her scar another 1000 cGy using electron beam.  Risks and benefits of treatment including skin reaction fatigue alteration blood counts possible occlusion of superficial lung all were discussed in detail with the patient.  I have personally set up and ordered CT simulation for next week.  Patient comprehends my recommendations well.  She also  will benefit from antiestrogen therapy after completion of radiation.  I would like to take this opportunity to thank you for allowing me to participate in the care of your patient.Noreene Filbert, MD

## 2021-08-22 ENCOUNTER — Encounter: Payer: Self-pay | Admitting: Gerontology

## 2021-08-22 ENCOUNTER — Ambulatory Visit: Payer: Self-pay | Admitting: Gerontology

## 2021-08-22 VITALS — BP 127/84 | HR 76 | Temp 98.0°F | Resp 16 | Ht 65.5 in | Wt 244.8 lb

## 2021-08-22 DIAGNOSIS — E876 Hypokalemia: Secondary | ICD-10-CM

## 2021-08-22 DIAGNOSIS — I10 Essential (primary) hypertension: Secondary | ICD-10-CM

## 2021-08-22 NOTE — Progress Notes (Signed)
Established Patient Office Visit  Subjective:  Patient ID: Cindy Burke, female    DOB: Feb 07, 1966  Age: 55 y.o. MRN: [id number]  CC:  Chief Complaint  Patient presents with   Follow-up   Hypertension    Patient has been checking her blood pressure at home and are running ~128/78    HPI Cindy Burke is a 55 y/o female who has history of hypertension, pre diabetes presents for routine follow up. She states that he's compliant with her medications, denies side effects and continues to make healthy lifestyle changes. She had lumpectomy done on her right breast  by Dr Raylene Everts on 08/04/21 and the site has healed completely.  She was seen by Oncologist Dr Sharol Given on 08/17/21 and was diagnosed with ER positive  Ductal Carcinoma In situ of the right breast with no invasive component. She was seen by the radiation oncologist Dr Eulas Post C on 08/21/21 and radiation to her right breast and antiestreogen therapy after radiation was recommended. She continues on 20 mEq potassium for lab done on 08/10/21 with potassium of 3.2 mol/L and will recheck lab on 09/20/21. She denies pain to her left knee. Overall, she states that she's doing well and offers no further complaint.      Past Medical History:  Diagnosis Date   Breast cancer ([place]) 07/2021   right   Diabetes mellitus without complication ([facility])    Headache    migraines   Hypertension    Passed out     Past Surgical History:  Procedure Laterality Date   BREAST BIOPSY Right 07/07/2021   stereo bx, ribbon clip,  DUCTAL CARCINOMA IN SITU,   BREAST BIOPSY Right 07/07/2021   Stereo bx,X clip,  DUCTAL CARCINOMA IN SITU   BREAST LUMPECTOMY WITH NEEDLE LOCALIZATION Right 08/04/2021   Procedure: BREAST LUMPECTOMY WITH NEEDLE LOCALIZATION;  Surgeon: Robert Bellow, MD;  Location: [facility] ORS;  Service: General;  Laterality: Right;    Family History  Problem Relation Age of Onset   Hypertension Mother    Cancer Mother    Diabetes  Mother    Hypertension Father    Heart attack Father    Diabetes Father    Stroke Brother    Sickle cell anemia Son    Breast cancer Neg Hx     Social History   Socioeconomic History   Marital status: Single    Spouse name: Not on file   Number of children: Not on file   Years of education: Not on file   Highest education level: Not on file  Occupational History   Occupation: Surveyor, quantity: [employer]  Tobacco Use   Smoking status: Every Day    Years: 10.00    Types: Cigarettes   Smokeless tobacco: Never   Tobacco comments:    2 cigarettes per day  Vaping Use   Vaping Use: Never used  Substance and Sexual Activity   Alcohol use: Yes    Alcohol/week: 1.0 - 2.0 standard drink    Types: 1 - 2 Shots of liquor per week   Drug use: Yes    Types: Marijuana    Comment: last use 08/21/21   Sexual activity: Not on file  Other Topics Concern   Not on file  Social History Narrative   Sometimes has trouble getting to work, relies on other people to take the because [employer] bus does not run far enough, also has trouble falling asleep occassionally   Social Determinants  of Health   Financial Resource Strain: Not on file  Food Insecurity: No Food Insecurity   Worried About Charity fundraiser in the Last Year: Never true   Ran Out of Food in the Last Year: Never true  Transportation Needs: Unmet Transportation Needs   Lack of Transportation (Medical): Yes   Lack of Transportation (Non-Medical): Yes  Physical Activity: Not on file  Stress: Not on file  Social Connections: Not on file  Intimate Partner Violence: Not on file    Outpatient Medications Prior to Visit  Medication Sig Dispense Refill   amLODipine (NORVASC) 10 MG tablet Take 1 tablet (10 mg total) by mouth once daily. (Patient taking differently: Take 10 mg by mouth every morning.) 90 tablet 0   atorvastatin (LIPITOR) 10 MG tablet Take 1 tablet (10 mg total) by mouth once daily. (Patient taking differently: Take 10  mg by mouth every morning.) 90 tablet 0   chlorthalidone (HYGROTON) 25 MG tablet Take 1 tablet (25 mg total) by mouth once daily. (Patient taking differently: Take 25 mg by mouth every morning.) 90 tablet 0   famotidine (PEPCID) 20 MG tablet Take 1 tablet (20 mg total) by mouth once daily. (Patient taking differently: Take 20 mg by mouth every morning.) 30 tablet 1   HYDROcodone-acetaminophen (NORCO/VICODIN) 5-325 MG tablet Take 1 tablet by mouth every 4 (four) hours as needed for moderate pain. 20 tablet 0   lisinopril (ZESTRIL) 20 MG tablet TAKE ONE TABLET BY MOUTH ONCE DAILY AT BEDTIME 90 tablet 0   metFORMIN (GLUCOPHAGE) 500 MG tablet TAKE ONE TABLET BY MOUTH EVERY DAY WITH BREAKFAST 90 tablet 0   ondansetron (ZOFRAN ODT) 4 MG disintegrating tablet Dissolve 1 tablet (4 mg total) by mouth every 8 (eight) hours as needed for nausea or vomiting. (Patient taking differently: Take 4 mg by mouth every 8 (eight) hours as needed for nausea or vomiting. None in the last week) 20 tablet 0   potassium chloride SA (KLOR-CON) 20 MEQ tablet Take 1 tablet (20 mEq total) by mouth once daily. 10 tablet 0   No facility-administered medications prior to visit.    Allergies  Allergen Reactions   Sulfamethoxazole-Trimethoprim     Breaks out in welts    ROS Review of Systems  Constitutional: Negative.   Respiratory: Negative.    Cardiovascular: Negative.   Skin: Negative.   Neurological: Negative.      Objective:    Physical Exam HENT:     Head: Normocephalic and atraumatic.  Eyes:     Extraocular Movements: Extraocular movements intact.     Conjunctiva/sclera: Conjunctivae normal.     Pupils: Pupils are equal, round, and reactive to light.  Cardiovascular:     Rate and Rhythm: Normal rate and regular rhythm.     Pulses: Normal pulses.     Heart sounds: Normal heart sounds.  Pulmonary:     Effort: Pulmonary effort is normal.     Breath sounds: Normal breath sounds.  Skin:    General: Skin  is warm.  Neurological:     General: No focal deficit present.     Mental Status: She is alert and oriented to person, place, and time. Mental status is at baseline.    BP 127/84 (BP Location: Right Arm, Patient Position: Sitting, Cuff Size: Large)   Pulse 76   Temp 98 F (36.7 C) (Oral)   Resp 16   Ht 5' 5.5" (1.664 m)   Wt 244 lb 12.8 oz (111 kg)  LMP  (LMP Unknown)   SpO2 98%   BMI 40.12 kg/m  Wt Readings from Last 3 Encounters:  08/22/21 244 lb 12.8 oz (111 kg)  08/21/21 244 lb 8 oz (110.9 kg)  08/17/21 245 lb (111.1 kg)   Encouraged weight loss  Health Maintenance Due  Topic Date Due   OPHTHALMOLOGY EXAM  Never done   Hepatitis C Screening  Never done   TETANUS/TDAP  Never done   Zoster Vaccines- Shingrix (1 of 2) Never done   COLONOSCOPY (Pts 45-84yr Insurance coverage will need to be confirmed)  Never done   COVID-19 Vaccine (2 - Moderna risk series) 01/29/2020   Pneumococcal Vaccine [id number] old (2 - PCV) 04/21/2021    There are no preventive care reminders to display for this patient.  Lab Results  Component Value Date   TSH 1.490 06/04/2019   Lab Results  Component Value Date   WBC 9.4 07/05/2021   HGB 14.6 08/04/2021   HCT 43.0 08/04/2021   MCV 71.2 (L) 07/05/2021   PLT 239 07/05/2021   Lab Results  Component Value Date   NA 140 08/04/2021   K 3.7 08/04/2021   CO2 26 07/31/2021   GLUCOSE 104 (H) 08/04/2021   BUN 23 (H) 08/04/2021   CREATININE 0.80 08/04/2021   BILITOT 0.6 07/31/2021   ALKPHOS 69 07/31/2021   AST 19 07/31/2021   ALT 17 07/31/2021   PROT 7.6 07/31/2021   ALBUMIN 4.2 07/31/2021   CALCIUM 9.3 07/31/2021   ANIONGAP 10 07/31/2021   EGFR 80 06/28/2021   Lab Results  Component Value Date   CHOL 206 (H) 03/16/2021   Lab Results  Component Value Date   HDL 51 03/16/2021   Lab Results  Component Value Date   LDLCALC 122 (H) 03/16/2021   Lab Results  Component Value Date   TRIG 188 (H) 03/16/2021   Lab Results   Component Value Date   CHOLHDL 4.0 03/16/2021   Lab Results  Component Value Date   HGBA1C 5.5 06/28/2021      Assessment & Plan:     1. Essential hypertension - Her blood pressure is under control, she will continue on current medication, DASH diet and exercise as tolerated.  2. Hypokalemia - She was advised to complete 10 day course of 20 mEq of potassium daily, will recheck on 09/20/21. She was advised to go to the ED with worsening symptoms. - Basic Metabolic Panel (BMET); Future     Follow-up: Return in about 5 weeks (around 09/26/2021), or if symptoms worsen or fail to improve.    Chioma EJerold Coombe NP

## 2021-08-28 ENCOUNTER — Ambulatory Visit: Payer: Medicaid Other

## 2021-08-28 DIAGNOSIS — Z510 Encounter for antineoplastic radiation therapy: Secondary | ICD-10-CM | POA: Insufficient documentation

## 2021-08-28 DIAGNOSIS — D0511 Intraductal carcinoma in situ of right breast: Secondary | ICD-10-CM | POA: Diagnosis present

## 2021-08-31 DIAGNOSIS — Z510 Encounter for antineoplastic radiation therapy: Secondary | ICD-10-CM | POA: Diagnosis not present

## 2021-09-01 ENCOUNTER — Other Ambulatory Visit: Payer: Self-pay | Admitting: *Deleted

## 2021-09-01 DIAGNOSIS — D0511 Intraductal carcinoma in situ of right breast: Secondary | ICD-10-CM

## 2021-09-03 NOTE — Progress Notes (Signed)
Positive biopsy.  BCCM approved. Navigation initiated. Pap neg/neg next pap due in 5 years.  Copy to [facility].

## 2021-09-04 ENCOUNTER — Ambulatory Visit: Admission: RE | Admit: 2021-09-04 | Payer: Medicaid Other | Source: Ambulatory Visit

## 2021-09-04 DIAGNOSIS — Z510 Encounter for antineoplastic radiation therapy: Secondary | ICD-10-CM | POA: Diagnosis not present

## 2021-09-05 ENCOUNTER — Ambulatory Visit
Admission: RE | Admit: 2021-09-05 | Discharge: 2021-09-05 | Disposition: A | Discharge status: Home / Self Care | Payer: Medicaid Other | Source: Ambulatory Visit | Attending: Radiation Oncology | Admitting: Radiation Oncology

## 2021-09-05 DIAGNOSIS — Z510 Encounter for antineoplastic radiation therapy: Secondary | ICD-10-CM | POA: Diagnosis not present

## 2021-09-06 ENCOUNTER — Ambulatory Visit
Admission: RE | Admit: 2021-09-06 | Discharge: 2021-09-06 | Disposition: A | Discharge status: Home / Self Care | Payer: Medicaid Other | Source: Ambulatory Visit | Attending: Radiation Oncology | Admitting: Radiation Oncology

## 2021-09-06 DIAGNOSIS — Z510 Encounter for antineoplastic radiation therapy: Secondary | ICD-10-CM | POA: Diagnosis not present

## 2021-09-07 ENCOUNTER — Ambulatory Visit
Admission: RE | Admit: 2021-09-07 | Discharge: 2021-09-07 | Disposition: A | Discharge status: Home / Self Care | Payer: Medicaid Other | Source: Ambulatory Visit | Attending: Radiation Oncology | Admitting: Radiation Oncology

## 2021-09-07 DIAGNOSIS — Z510 Encounter for antineoplastic radiation therapy: Secondary | ICD-10-CM | POA: Diagnosis not present

## 2021-09-08 ENCOUNTER — Ambulatory Visit
Admission: RE | Admit: 2021-09-08 | Discharge: 2021-09-08 | Disposition: A | Discharge status: Home / Self Care | Payer: Medicaid Other | Source: Ambulatory Visit | Attending: Radiation Oncology | Admitting: Radiation Oncology

## 2021-09-08 DIAGNOSIS — Z510 Encounter for antineoplastic radiation therapy: Secondary | ICD-10-CM | POA: Diagnosis not present

## 2021-09-11 ENCOUNTER — Ambulatory Visit
Admission: RE | Admit: 2021-09-11 | Discharge: 2021-09-11 | Disposition: A | Discharge status: Home / Self Care | Payer: Medicaid Other | Source: Ambulatory Visit | Attending: Radiation Oncology | Admitting: Radiation Oncology

## 2021-09-11 DIAGNOSIS — Z510 Encounter for antineoplastic radiation therapy: Secondary | ICD-10-CM | POA: Diagnosis not present

## 2021-09-12 ENCOUNTER — Ambulatory Visit
Admission: RE | Admit: 2021-09-12 | Discharge: 2021-09-12 | Disposition: A | Discharge status: Home / Self Care | Payer: Medicaid Other | Source: Ambulatory Visit | Attending: Radiation Oncology | Admitting: Radiation Oncology

## 2021-09-12 DIAGNOSIS — Z510 Encounter for antineoplastic radiation therapy: Secondary | ICD-10-CM | POA: Diagnosis not present

## 2021-09-13 ENCOUNTER — Ambulatory Visit
Admission: RE | Admit: 2021-09-13 | Discharge: 2021-09-13 | Disposition: A | Discharge status: Home / Self Care | Payer: Medicaid Other | Source: Ambulatory Visit | Attending: Radiation Oncology | Admitting: Radiation Oncology

## 2021-09-13 DIAGNOSIS — Z510 Encounter for antineoplastic radiation therapy: Secondary | ICD-10-CM | POA: Diagnosis not present

## 2021-09-18 ENCOUNTER — Ambulatory Visit
Admission: RE | Admit: 2021-09-18 | Discharge: 2021-09-18 | Disposition: A | Discharge status: Home / Self Care | Payer: Medicaid Other | Source: Ambulatory Visit | Attending: Radiation Oncology | Admitting: Radiation Oncology

## 2021-09-18 DIAGNOSIS — Z510 Encounter for antineoplastic radiation therapy: Secondary | ICD-10-CM | POA: Diagnosis not present

## 2021-09-19 ENCOUNTER — Ambulatory Visit
Admission: RE | Admit: 2021-09-19 | Discharge: 2021-09-19 | Disposition: A | Discharge status: Home / Self Care | Payer: Medicaid Other | Source: Ambulatory Visit | Attending: Radiation Oncology | Admitting: Radiation Oncology

## 2021-09-19 DIAGNOSIS — Z510 Encounter for antineoplastic radiation therapy: Secondary | ICD-10-CM | POA: Diagnosis not present

## 2021-09-20 ENCOUNTER — Other Ambulatory Visit: Payer: Medicaid Other

## 2021-09-20 ENCOUNTER — Other Ambulatory Visit: Payer: Self-pay

## 2021-09-20 ENCOUNTER — Ambulatory Visit
Admission: RE | Admit: 2021-09-20 | Discharge: 2021-09-20 | Disposition: A | Discharge status: Home / Self Care | Payer: Medicaid Other | Source: Ambulatory Visit | Attending: Radiation Oncology | Admitting: Radiation Oncology

## 2021-09-20 ENCOUNTER — Inpatient Hospital Stay: Payer: Medicaid Other | Attending: Oncology

## 2021-09-20 VITALS — BP 137/85 | HR 67 | Temp 98.6°F | Ht 65.0 in | Wt 249.6 lb

## 2021-09-20 DIAGNOSIS — Z23 Encounter for immunization: Secondary | ICD-10-CM

## 2021-09-20 DIAGNOSIS — D0511 Intraductal carcinoma in situ of right breast: Secondary | ICD-10-CM

## 2021-09-20 DIAGNOSIS — Z510 Encounter for antineoplastic radiation therapy: Secondary | ICD-10-CM | POA: Diagnosis not present

## 2021-09-20 DIAGNOSIS — E876 Hypokalemia: Secondary | ICD-10-CM

## 2021-09-21 ENCOUNTER — Ambulatory Visit
Admission: RE | Admit: 2021-09-21 | Discharge: 2021-09-21 | Disposition: A | Discharge status: Home / Self Care | Payer: Medicaid Other | Source: Ambulatory Visit | Attending: Radiation Oncology | Admitting: Radiation Oncology

## 2021-09-21 DIAGNOSIS — D0511 Intraductal carcinoma in situ of right breast: Secondary | ICD-10-CM | POA: Diagnosis present

## 2021-09-21 DIAGNOSIS — Z510 Encounter for antineoplastic radiation therapy: Secondary | ICD-10-CM | POA: Diagnosis not present

## 2021-09-21 LAB — BASIC METABOLIC PANEL
BUN/Creatinine Ratio: 30 — ABNORMAL HIGH (ref 9–23)
BUN: 24 mg/dL (ref 6–24)
CO2: 29 mmol/L (ref 20–29)
Calcium: 9.7 mg/dL (ref 8.7–10.2)
Chloride: 103 mmol/L (ref 96–106)
Creatinine, Ser: 0.8 mg/dL (ref 0.57–1.00)
Glucose: 129 mg/dL — ABNORMAL HIGH (ref 70–99)
Potassium: 3.4 mmol/L — ABNORMAL LOW (ref 3.5–5.2)
Sodium: 144 mmol/L (ref 134–144)
eGFR: 86 mL/min/{1.73_m2} (ref >59)

## 2021-09-21 LAB — CBC WITH DIFFERENTIAL/PLATELET
Basophils Absolute: 0.1 10*3/uL (ref 0.0–0.2)
Basos: 1 %
EOS (ABSOLUTE): 0.6 10*3/uL — ABNORMAL HIGH (ref 0.0–0.4)
Eos: 7 %
Hematocrit: 38 % (ref 34.0–46.6)
Hemoglobin: 12.1 g/dL (ref 11.1–15.9)
Immature Grans (Abs): 0 10*3/uL (ref 0.0–0.1)
Immature Granulocytes: 0 %
Lymphocytes Absolute: 2 10*3/uL (ref 0.7–3.1)
Lymphs: 25 %
MCH: 22.2 pg — ABNORMAL LOW (ref 26.6–33.0)
MCHC: 31.8 g/dL (ref 31.5–35.7)
MCV: 70 fL — ABNORMAL LOW (ref 79–97)
Monocytes Absolute: 0.6 10*3/uL (ref 0.1–0.9)
Monocytes: 7 %
Neutrophils Absolute: 4.7 10*3/uL (ref 1.4–7.0)
Neutrophils: 60 %
Platelets: 237 10*3/uL (ref 150–450)
RBC: 5.46 x10E6/uL — ABNORMAL HIGH (ref 3.77–5.28)
RDW: 14 % (ref 11.7–15.4)
WBC: 7.9 10*3/uL (ref 3.4–10.8)

## 2021-09-22 ENCOUNTER — Ambulatory Visit
Admission: RE | Admit: 2021-09-22 | Discharge: 2021-09-22 | Disposition: A | Discharge status: Home / Self Care | Payer: Medicaid Other | Source: Ambulatory Visit | Attending: Radiation Oncology | Admitting: Radiation Oncology

## 2021-09-22 DIAGNOSIS — Z510 Encounter for antineoplastic radiation therapy: Secondary | ICD-10-CM | POA: Diagnosis not present

## 2021-09-25 ENCOUNTER — Ambulatory Visit
Admission: RE | Admit: 2021-09-25 | Discharge: 2021-09-25 | Disposition: A | Discharge status: Home / Self Care | Payer: Medicaid Other | Source: Ambulatory Visit | Attending: Radiation Oncology | Admitting: Radiation Oncology

## 2021-09-25 DIAGNOSIS — Z510 Encounter for antineoplastic radiation therapy: Secondary | ICD-10-CM | POA: Diagnosis not present

## 2021-09-26 ENCOUNTER — Other Ambulatory Visit: Payer: Self-pay

## 2021-09-26 ENCOUNTER — Other Ambulatory Visit: Payer: Self-pay | Admitting: *Deleted

## 2021-09-26 ENCOUNTER — Ambulatory Visit
Admission: RE | Admit: 2021-09-26 | Discharge: 2021-09-26 | Disposition: A | Discharge status: Home / Self Care | Payer: Medicaid Other | Source: Ambulatory Visit | Attending: Radiation Oncology | Admitting: Radiation Oncology

## 2021-09-26 DIAGNOSIS — D0511 Intraductal carcinoma in situ of right breast: Secondary | ICD-10-CM | POA: Insufficient documentation

## 2021-09-26 DIAGNOSIS — Z510 Encounter for antineoplastic radiation therapy: Secondary | ICD-10-CM | POA: Diagnosis not present

## 2021-09-26 MED ORDER — SILVER SULFADIAZINE 1 % EX CREA
1.0000 "application " | TOPICAL_CREAM | Freq: Two times a day (BID) | CUTANEOUS | 2 refills | Status: DC
  Filled 2021-09-26: qty 50, 25d supply, fill #0

## 2021-09-27 ENCOUNTER — Ambulatory Visit: Payer: Medicaid Other

## 2021-09-27 ENCOUNTER — Ambulatory Visit: Payer: Self-pay | Admitting: Gerontology

## 2021-09-28 ENCOUNTER — Other Ambulatory Visit: Payer: Self-pay | Admitting: *Deleted

## 2021-09-28 ENCOUNTER — Ambulatory Visit: Payer: Medicaid Other

## 2021-09-28 DIAGNOSIS — D0511 Intraductal carcinoma in situ of right breast: Secondary | ICD-10-CM

## 2021-09-29 ENCOUNTER — Ambulatory Visit: Payer: Medicaid Other

## 2021-10-02 ENCOUNTER — Ambulatory Visit
Admission: RE | Admit: 2021-10-02 | Discharge: 2021-10-02 | Disposition: A | Discharge status: Home / Self Care | Payer: Medicaid Other | Source: Ambulatory Visit | Attending: Radiation Oncology | Admitting: Radiation Oncology

## 2021-10-02 DIAGNOSIS — Z510 Encounter for antineoplastic radiation therapy: Secondary | ICD-10-CM | POA: Diagnosis not present

## 2021-10-03 ENCOUNTER — Ambulatory Visit
Admission: RE | Admit: 2021-10-03 | Discharge: 2021-10-03 | Disposition: A | Discharge status: Home / Self Care | Payer: Medicaid Other | Source: Ambulatory Visit | Attending: Radiation Oncology | Admitting: Radiation Oncology

## 2021-10-03 DIAGNOSIS — Z510 Encounter for antineoplastic radiation therapy: Secondary | ICD-10-CM | POA: Insufficient documentation

## 2021-10-03 DIAGNOSIS — D0511 Intraductal carcinoma in situ of right breast: Secondary | ICD-10-CM | POA: Diagnosis present

## 2021-10-04 ENCOUNTER — Ambulatory Visit: Payer: Medicaid Other

## 2021-10-04 ENCOUNTER — Inpatient Hospital Stay: Payer: Medicaid Other

## 2021-10-05 ENCOUNTER — Ambulatory Visit
Admission: RE | Admit: 2021-10-05 | Discharge: 2021-10-05 | Disposition: A | Discharge status: Home / Self Care | Payer: Medicaid Other | Source: Ambulatory Visit | Attending: Radiation Oncology | Admitting: Radiation Oncology

## 2021-10-05 ENCOUNTER — Inpatient Hospital Stay: Payer: Medicaid Other | Attending: Radiation Oncology

## 2021-10-05 DIAGNOSIS — Z510 Encounter for antineoplastic radiation therapy: Secondary | ICD-10-CM | POA: Diagnosis not present

## 2021-10-06 ENCOUNTER — Ambulatory Visit
Admission: RE | Admit: 2021-10-06 | Discharge: 2021-10-06 | Disposition: A | Discharge status: Home / Self Care | Payer: Medicaid Other | Source: Ambulatory Visit | Attending: Radiation Oncology | Admitting: Radiation Oncology

## 2021-10-06 DIAGNOSIS — Z510 Encounter for antineoplastic radiation therapy: Secondary | ICD-10-CM | POA: Diagnosis not present

## 2021-10-09 ENCOUNTER — Ambulatory Visit
Admission: RE | Admit: 2021-10-09 | Discharge: 2021-10-09 | Disposition: A | Discharge status: Home / Self Care | Payer: Medicaid Other | Source: Ambulatory Visit | Attending: Radiation Oncology | Admitting: Radiation Oncology

## 2021-10-09 DIAGNOSIS — Z510 Encounter for antineoplastic radiation therapy: Secondary | ICD-10-CM | POA: Diagnosis not present

## 2021-10-10 ENCOUNTER — Ambulatory Visit: Payer: Medicaid Other

## 2021-10-11 ENCOUNTER — Ambulatory Visit
Admission: RE | Admit: 2021-10-11 | Discharge: 2021-10-11 | Disposition: A | Discharge status: Home / Self Care | Payer: Medicaid Other | Source: Ambulatory Visit | Attending: Radiation Oncology | Admitting: Radiation Oncology

## 2021-10-11 DIAGNOSIS — D0511 Intraductal carcinoma in situ of right breast: Secondary | ICD-10-CM | POA: Diagnosis present

## 2021-10-11 DIAGNOSIS — Z510 Encounter for antineoplastic radiation therapy: Secondary | ICD-10-CM | POA: Diagnosis not present

## 2021-10-12 ENCOUNTER — Ambulatory Visit: Payer: Medicaid Other

## 2021-10-12 DIAGNOSIS — Z510 Encounter for antineoplastic radiation therapy: Secondary | ICD-10-CM | POA: Diagnosis not present

## 2021-10-13 ENCOUNTER — Ambulatory Visit
Admission: RE | Admit: 2021-10-13 | Discharge: 2021-10-13 | Disposition: A | Discharge status: Home / Self Care | Payer: Medicaid Other | Source: Ambulatory Visit | Attending: Radiation Oncology | Admitting: Radiation Oncology

## 2021-10-13 DIAGNOSIS — Z510 Encounter for antineoplastic radiation therapy: Secondary | ICD-10-CM | POA: Diagnosis not present

## 2021-10-17 ENCOUNTER — Ambulatory Visit
Admission: RE | Admit: 2021-10-17 | Discharge: 2021-10-17 | Disposition: A | Discharge status: Home / Self Care | Payer: Medicaid Other | Source: Ambulatory Visit | Attending: Radiation Oncology | Admitting: Radiation Oncology

## 2021-10-17 DIAGNOSIS — Z510 Encounter for antineoplastic radiation therapy: Secondary | ICD-10-CM | POA: Diagnosis not present

## 2021-10-18 ENCOUNTER — Ambulatory Visit
Admission: RE | Admit: 2021-10-18 | Discharge: 2021-10-18 | Disposition: A | Discharge status: Home / Self Care | Payer: Medicaid Other | Source: Ambulatory Visit | Attending: Radiation Oncology | Admitting: Radiation Oncology

## 2021-10-18 ENCOUNTER — Inpatient Hospital Stay: Payer: Medicaid Other | Attending: Radiation Oncology

## 2021-10-18 ENCOUNTER — Ambulatory Visit: Payer: Medicaid Other

## 2021-10-18 DIAGNOSIS — Z510 Encounter for antineoplastic radiation therapy: Secondary | ICD-10-CM | POA: Diagnosis not present

## 2021-10-19 ENCOUNTER — Ambulatory Visit: Payer: Medicaid Other

## 2021-10-19 ENCOUNTER — Ambulatory Visit
Admission: RE | Admit: 2021-10-19 | Discharge: 2021-10-19 | Disposition: A | Discharge status: Home / Self Care | Payer: Medicaid Other | Source: Ambulatory Visit | Attending: Radiation Oncology | Admitting: Radiation Oncology

## 2021-10-19 DIAGNOSIS — Z510 Encounter for antineoplastic radiation therapy: Secondary | ICD-10-CM | POA: Diagnosis not present

## 2021-10-19 NOTE — Progress Notes (Signed)
[facility]  [phone] [phone]  ID: [name] OB: [birth year]  MR#: [id number]  TDV#:[id number]  Patient Care Team: Langston Reusing, NP as PCP - General (Gerontology) Rico Junker, RN as Registered Nurse Theodore Demark, RN as Registered Nurse Theodore Demark, RN as Oncology Nurse Navigator  CHIEF COMPLAINT: ER positive DCIS, right breast.  INTERVAL HISTORY: Patient returns to clinic today at the end of her adjuvant XRT for further evaluation and initiation of tamoxifen.  She tolerated treatments well with only some mild skin erythema.  She currently feels well and is asymptomatic.  She has no neurologic complaints.  She denies any recent fevers or illnesses.  She has a good appetite and denies weight loss.  She has no chest pain, shortness of breath, cough, or hemoptysis.  She denies any nausea, vomiting, constipation, or diarrhea.  She has no urinary complaints.  Patient offers no further specific complaints today.    REVIEW OF SYSTEMS:   Review of Systems  Constitutional: Negative.  Negative for fever, malaise/fatigue and weight loss.  Respiratory: Negative.  Negative for cough, hemoptysis and shortness of breath.   Cardiovascular: Negative.  Negative for chest pain and leg swelling.  Gastrointestinal: Negative.  Negative for abdominal pain.  Genitourinary: Negative.  Negative for dysuria.  Musculoskeletal: Negative.  Negative for back pain.  Skin: Negative.  Negative for rash.  Neurological: Negative.  Negative for dizziness, focal weakness, weakness and headaches.  Psychiatric/Behavioral: Negative.  The patient is not nervous/anxious.    As per HPI. Otherwise, a complete review of systems is negative.  PAST MEDICAL HISTORY: Past Medical History:  Diagnosis Date   Breast cancer ([place]) 07/2021   right   Diabetes mellitus without complication ([facility])    Headache    migraines   Hypertension    Passed out     PAST  SURGICAL HISTORY: Past Surgical History:  Procedure Laterality Date   BREAST BIOPSY Right 07/07/2021   stereo bx, ribbon clip,  DUCTAL CARCINOMA IN SITU,   BREAST BIOPSY Right 07/07/2021   Stereo bx,X clip,  DUCTAL CARCINOMA IN SITU   BREAST LUMPECTOMY WITH NEEDLE LOCALIZATION Right 08/04/2021   Procedure: BREAST LUMPECTOMY WITH NEEDLE LOCALIZATION;  Surgeon: Robert Bellow, MD;  Location: [facility] ORS;  Service: General;  Laterality: Right;    FAMILY HISTORY: Family History  Problem Relation Age of Onset   Hypertension Mother    Cancer Mother    Diabetes Mother    Hypertension Father    Heart attack Father    Diabetes Father    Stroke Brother    Sickle cell anemia Son    Breast cancer Neg Hx     ADVANCED DIRECTIVES (Y/N):  N  HEALTH MAINTENANCE: Social History   Tobacco Use   Smoking status: Every Day    Years: 10.00    Types: Cigarettes   Smokeless tobacco: Never   Tobacco comments:    2 cigarettes per day  Vaping Use   Vaping Use: Never used  Substance Use Topics   Alcohol use: Yes    Alcohol/week: 1.0 - 2.0 standard drink    Types: 1 - 2 Shots of liquor per week   Drug use: Yes    Types: Marijuana    Comment: last use 08/21/21     Colonoscopy:  PAP:  Bone density:  Lipid panel:  Allergies  Allergen Reactions   Sulfamethoxazole-Trimethoprim     Breaks out in welts    Current Outpatient  Medications  Medication Sig Dispense Refill   amLODipine (NORVASC) 10 MG tablet Take 1 tablet (10 mg total) by mouth once daily. (Patient taking differently: Take 10 mg by mouth every morning.) 90 tablet 0   atorvastatin (LIPITOR) 10 MG tablet Take 1 tablet (10 mg total) by mouth once daily. (Patient taking differently: Take 10 mg by mouth every morning.) 90 tablet 0   chlorthalidone (HYGROTON) 25 MG tablet Take 1 tablet (25 mg total) by mouth once daily. (Patient taking differently: Take 25 mg by mouth every morning.) 90 tablet 0   famotidine (PEPCID) 20 MG tablet  Take 1 tablet (20 mg total) by mouth once daily. (Patient taking differently: Take 20 mg by mouth every morning.) 30 tablet 1   HYDROcodone-acetaminophen (NORCO/VICODIN) 5-325 MG tablet Take 1 tablet by mouth every 4 (four) hours as needed for moderate pain. 20 tablet 0   lisinopril (ZESTRIL) 20 MG tablet TAKE ONE TABLET BY MOUTH ONCE DAILY AT BEDTIME 90 tablet 0   metFORMIN (GLUCOPHAGE) 500 MG tablet TAKE ONE TABLET BY MOUTH EVERY DAY WITH BREAKFAST 90 tablet 0   ondansetron (ZOFRAN ODT) 4 MG disintegrating tablet Dissolve 1 tablet (4 mg total) by mouth every 8 (eight) hours as needed for nausea or vomiting. (Patient taking differently: Take 4 mg by mouth every 8 (eight) hours as needed for nausea or vomiting. None in the last week) 20 tablet 0   potassium chloride SA (KLOR-CON) 20 MEQ tablet Take 1 tablet (20 mEq total) by mouth once daily. 10 tablet 0   silver sulfADIAZINE (SILVADENE) 1 % cream Apply 1 application topically to the affected area(s) 2 (two) times daily. 85 g 2   No current facility-administered medications for this visit.    OBJECTIVE: Vitals:   10/24/21 1522  BP: (!) 148/84  Pulse: 74  Resp: 16  Temp: (!) 97.2 F (36.2 C)  SpO2: 100%     Body mass index is 41.79 kg/m.    ECOG FS:0 - Asymptomatic  General: Well-developed, well-nourished, no acute distress. Eyes: Pink conjunctiva, anicteric sclera. HEENT: Normocephalic, moist mucous membranes. Breast: Right breast with mild erythema. Lungs: No audible wheezing or coughing. Heart: Regular rate and rhythm. Abdomen: Soft, nontender, no obvious distention. Musculoskeletal: No edema, cyanosis, or clubbing. Neuro: Alert, answering all questions appropriately. Cranial nerves grossly intact. Skin: No rashes or petechiae noted. Psych: Normal affect.   LAB RESULTS:  Lab Results  Component Value Date   NA 144 09/20/2021   K 3.4 (L) 09/20/2021   CL 103 09/20/2021   CO2 29 09/20/2021   GLUCOSE 129 (H) 09/20/2021   BUN  24 09/20/2021   CREATININE 0.80 09/20/2021   CALCIUM 9.7 09/20/2021   PROT 7.6 07/31/2021   ALBUMIN 4.2 07/31/2021   AST 19 07/31/2021   ALT 17 07/31/2021   ALKPHOS 69 07/31/2021   BILITOT 0.6 07/31/2021   GFRNONAA >60 07/31/2021   GFRAA 86 06/22/2020    Lab Results  Component Value Date   WBC 7.9 09/20/2021   NEUTROABS 4.7 09/20/2021   HGB 12.1 09/20/2021   HCT 38.0 09/20/2021   MCV 70 (L) 09/20/2021   PLT 237 09/20/2021     STUDIES: No results found.  ASSESSMENT: ER positive DCIS, right breast.  PLAN:    ER positive DCIS, right breast: Lumpectomy completed on August 04, 2021 confirmed the above-stated diagnosis with no invasive component noted.  Because of that, patient did not require adjuvant chemotherapy.  She will complete adjuvant XRT tomorrow.  Patient  was given a prescription for tamoxifen which she will take for a total of 5 years completing treatment in January 2028.  Return to clinic in 3 months for routine evaluation and assess her toleration of treatment.   I spent a total of 30 minutes reviewing chart data, face-to-face evaluation with the patient, counseling and coordination of care as detailed above.  Patient expressed understanding and was in agreement with this plan. She also understands that She can call clinic at any time with any questions, concerns, or complaints.    Cancer Staging  Ductal carcinoma in situ (DCIS) of right breast Staging form: Breast, AJCC 8th Edition - Clinical stage from 07/15/2021: Stage 0 (cTis (DCIS), cN0, cM0, ER+, PR+, HER2-) - Signed by Lloyd Huger, MD on 07/15/2021 Stage prefix: Initial diagnosis Nuclear grade: Patrecia Pour, MD   10/24/2021 7:50 PM

## 2021-10-20 ENCOUNTER — Ambulatory Visit: Payer: Medicaid Other

## 2021-10-20 ENCOUNTER — Ambulatory Visit
Admission: RE | Admit: 2021-10-20 | Discharge: 2021-10-20 | Disposition: A | Discharge status: Home / Self Care | Payer: Medicaid Other | Source: Ambulatory Visit | Attending: Radiation Oncology | Admitting: Radiation Oncology

## 2021-10-20 DIAGNOSIS — Z510 Encounter for antineoplastic radiation therapy: Secondary | ICD-10-CM | POA: Diagnosis not present

## 2021-10-24 ENCOUNTER — Inpatient Hospital Stay (HOSPITAL_BASED_OUTPATIENT_CLINIC_OR_DEPARTMENT_OTHER): Payer: Medicaid Other | Admitting: Oncology

## 2021-10-24 ENCOUNTER — Ambulatory Visit: Payer: Medicaid Other

## 2021-10-24 ENCOUNTER — Ambulatory Visit
Admission: RE | Admit: 2021-10-24 | Discharge: 2021-10-24 | Disposition: A | Discharge status: Home / Self Care | Payer: Medicaid Other | Source: Ambulatory Visit | Attending: Radiation Oncology | Admitting: Radiation Oncology

## 2021-10-24 ENCOUNTER — Other Ambulatory Visit: Payer: Self-pay

## 2021-10-24 VITALS — BP 148/84 | HR 74 | Temp 97.2°F | Resp 16 | Wt 251.1 lb

## 2021-10-24 DIAGNOSIS — F17210 Nicotine dependence, cigarettes, uncomplicated: Secondary | ICD-10-CM | POA: Insufficient documentation

## 2021-10-24 DIAGNOSIS — D0511 Intraductal carcinoma in situ of right breast: Secondary | ICD-10-CM | POA: Insufficient documentation

## 2021-10-24 DIAGNOSIS — F1210 Cannabis abuse, uncomplicated: Secondary | ICD-10-CM | POA: Insufficient documentation

## 2021-10-24 DIAGNOSIS — Z170 Estrogen receptor positive status [ER+]: Secondary | ICD-10-CM | POA: Insufficient documentation

## 2021-10-24 DIAGNOSIS — Z510 Encounter for antineoplastic radiation therapy: Secondary | ICD-10-CM | POA: Diagnosis present

## 2021-10-24 DIAGNOSIS — Z79810 Long term (current) use of selective estrogen receptor modulators (SERMs): Secondary | ICD-10-CM | POA: Insufficient documentation

## 2021-10-24 NOTE — Progress Notes (Signed)
Pt c/o raw/tender spot at site of surgery x1week. Pt recently evicted from home.

## 2021-10-25 ENCOUNTER — Ambulatory Visit: Payer: Medicaid Other

## 2021-10-25 ENCOUNTER — Ambulatory Visit
Admission: RE | Admit: 2021-10-25 | Discharge: 2021-10-25 | Disposition: A | Discharge status: Home / Self Care | Payer: Medicaid Other | Source: Ambulatory Visit | Attending: Radiation Oncology | Admitting: Radiation Oncology

## 2021-10-25 DIAGNOSIS — Z510 Encounter for antineoplastic radiation therapy: Secondary | ICD-10-CM | POA: Diagnosis not present

## 2021-10-26 ENCOUNTER — Ambulatory Visit
Admission: RE | Admit: 2021-10-26 | Discharge: 2021-10-26 | Disposition: A | Discharge status: Home / Self Care | Payer: Medicaid Other | Source: Ambulatory Visit | Attending: Radiation Oncology | Admitting: Radiation Oncology

## 2021-10-26 ENCOUNTER — Ambulatory Visit: Payer: Medicaid Other

## 2021-10-26 DIAGNOSIS — Z510 Encounter for antineoplastic radiation therapy: Secondary | ICD-10-CM | POA: Diagnosis not present

## 2021-10-27 ENCOUNTER — Ambulatory Visit
Admission: RE | Admit: 2021-10-27 | Discharge: 2021-10-27 | Disposition: A | Discharge status: Home / Self Care | Payer: Medicaid Other | Source: Ambulatory Visit | Attending: Radiation Oncology | Admitting: Radiation Oncology

## 2021-10-27 ENCOUNTER — Ambulatory Visit: Payer: Medicaid Other

## 2021-10-27 DIAGNOSIS — Z510 Encounter for antineoplastic radiation therapy: Secondary | ICD-10-CM | POA: Diagnosis not present

## 2021-10-30 ENCOUNTER — Ambulatory Visit: Payer: Medicaid Other

## 2021-10-30 ENCOUNTER — Other Ambulatory Visit: Payer: Self-pay

## 2021-10-30 ENCOUNTER — Ambulatory Visit: Admission: RE | Admit: 2021-10-30 | Payer: Medicaid Other | Source: Ambulatory Visit

## 2021-10-30 DIAGNOSIS — D0511 Intraductal carcinoma in situ of right breast: Secondary | ICD-10-CM

## 2021-10-30 MED ORDER — SILVER SULFADIAZINE 1 % EX CREA: TOPICAL_CREAM | Freq: Two times a day (BID) | CUTANEOUS | Status: DC

## 2021-10-30 MED ORDER — SILVADENE 1 % EX CREA
1.0000 "application " | TOPICAL_CREAM | Freq: Two times a day (BID) | CUTANEOUS | 3 refills | Status: AC
  Filled 2021-10-30: qty 50, 25d supply, fill #0

## 2021-10-31 ENCOUNTER — Ambulatory Visit: Payer: Medicaid Other

## 2021-11-01 ENCOUNTER — Ambulatory Visit: Payer: Medicaid Other | Admitting: Gerontology

## 2021-11-01 ENCOUNTER — Ambulatory Visit: Payer: Medicaid Other

## 2021-11-02 ENCOUNTER — Ambulatory Visit: Payer: Medicaid Other

## 2021-11-02 ENCOUNTER — Ambulatory Visit: Admission: RE | Admit: 2021-11-02 | Payer: Medicaid Other | Source: Ambulatory Visit

## 2021-11-03 ENCOUNTER — Ambulatory Visit: Payer: Medicaid Other

## 2021-11-06 ENCOUNTER — Ambulatory Visit: Payer: Medicaid Other

## 2021-11-07 ENCOUNTER — Ambulatory Visit: Payer: Medicaid Other

## 2021-11-08 ENCOUNTER — Ambulatory Visit: Payer: Medicaid Other

## 2021-11-09 ENCOUNTER — Ambulatory Visit: Payer: Medicaid Other

## 2021-11-10 ENCOUNTER — Ambulatory Visit: Payer: Medicaid Other

## 2021-11-13 ENCOUNTER — Ambulatory Visit
Admission: RE | Admit: 2021-11-13 | Discharge: 2021-11-13 | Disposition: A | Discharge status: Home / Self Care | Payer: Medicaid Other | Source: Ambulatory Visit | Attending: Radiation Oncology | Admitting: Radiation Oncology

## 2021-11-13 ENCOUNTER — Ambulatory Visit: Payer: Medicaid Other

## 2021-11-13 DIAGNOSIS — Z510 Encounter for antineoplastic radiation therapy: Secondary | ICD-10-CM | POA: Diagnosis not present

## 2021-11-14 ENCOUNTER — Ambulatory Visit: Payer: Medicaid Other

## 2021-11-14 ENCOUNTER — Ambulatory Visit
Admission: RE | Admit: 2021-11-14 | Discharge: 2021-11-14 | Disposition: A | Discharge status: Home / Self Care | Payer: Medicaid Other | Source: Ambulatory Visit | Attending: Radiation Oncology | Admitting: Radiation Oncology

## 2021-11-14 DIAGNOSIS — Z510 Encounter for antineoplastic radiation therapy: Secondary | ICD-10-CM | POA: Diagnosis not present

## 2021-11-15 ENCOUNTER — Ambulatory Visit
Admission: RE | Admit: 2021-11-15 | Discharge: 2021-11-15 | Disposition: A | Discharge status: Home / Self Care | Payer: Medicaid Other | Source: Ambulatory Visit | Attending: Radiation Oncology | Admitting: Radiation Oncology

## 2021-11-15 DIAGNOSIS — Z510 Encounter for antineoplastic radiation therapy: Secondary | ICD-10-CM | POA: Diagnosis not present

## 2021-11-16 ENCOUNTER — Ambulatory Visit
Admission: RE | Admit: 2021-11-16 | Discharge: 2021-11-16 | Disposition: A | Discharge status: Home / Self Care | Payer: Medicaid Other | Source: Ambulatory Visit | Attending: Radiation Oncology | Admitting: Radiation Oncology

## 2021-11-16 DIAGNOSIS — Z510 Encounter for antineoplastic radiation therapy: Secondary | ICD-10-CM | POA: Diagnosis not present

## 2021-12-05 ENCOUNTER — Other Ambulatory Visit: Payer: Self-pay

## 2021-12-05 ENCOUNTER — Encounter: Payer: Self-pay | Admitting: Gerontology

## 2021-12-05 ENCOUNTER — Ambulatory Visit: Payer: Medicaid Other | Admitting: Gerontology

## 2021-12-05 VITALS — BP 153/85 | HR 70 | Temp 98.1°F | Resp 16 | Ht 65.0 in | Wt 259.1 lb

## 2021-12-05 DIAGNOSIS — I10 Essential (primary) hypertension: Secondary | ICD-10-CM

## 2021-12-05 DIAGNOSIS — E785 Hyperlipidemia, unspecified: Secondary | ICD-10-CM

## 2021-12-05 DIAGNOSIS — R7303 Prediabetes: Secondary | ICD-10-CM

## 2021-12-05 MED ORDER — AMLODIPINE BESYLATE 10 MG PO TABS
10.0000 mg | ORAL_TABLET | Freq: Every day | ORAL | 1 refills | Status: AC
Start: 2021-12-05 — End: ?
  Filled 2021-12-05: qty 30, 30d supply, fill #0

## 2021-12-05 MED ORDER — ATORVASTATIN CALCIUM 10 MG PO TABS
10.0000 mg | ORAL_TABLET | Freq: Every day | ORAL | 1 refills | Status: AC
Start: 2021-12-05 — End: ?
  Filled 2021-12-05: qty 30, 30d supply, fill #0

## 2021-12-05 MED ORDER — METFORMIN HCL 500 MG PO TABS
ORAL_TABLET | ORAL | 1 refills | Status: DC
  Filled 2021-12-05: qty 30, fill #0

## 2021-12-05 MED ORDER — LISINOPRIL 20 MG PO TABS
ORAL_TABLET | Freq: Every day | ORAL | 1 refills | Status: AC
  Filled 2021-12-05: qty 30, fill #0

## 2021-12-05 MED ORDER — CHLORTHALIDONE 25 MG PO TABS
25.0000 mg | ORAL_TABLET | Freq: Every day | ORAL | 1 refills | Status: AC
Start: 2021-12-05 — End: ?
  Filled 2021-12-05: qty 30, 30d supply, fill #0

## 2021-12-05 NOTE — Patient Instructions (Signed)

## 2021-12-05 NOTE — Progress Notes (Signed)
Established Patient Office Visit  Subjective:  Patient ID: Cindy Burke, female    DOB: Feb 08, 1966  Age: 57 y.o. MRN: [id number]  CC:  Chief Complaint  Patient presents with   Follow-up    Needs medication refills. Has been out of medications for "a while" .Patient has insurance and has new PCP [facility]) [place] on 01/02/22.    HPI Cindy Burke  is a 56 y/o female who has history of hypertension, pre diabetes presents for routine follow up and medication refill. She missed couple of her follow up visit and has been out of her medications for few weeks. She was seen by Oncologist Dr Sharol Given on 10/24/21, for positive ER positive DCIS of right breast after completing her  adjuvant XRT and initiation of tamoxifen and she will take it for 5 years. She denies any medication side effects. She has active Medicaid and today will be her last visit. Overall, she states that she's doing well and offers no further complaint.  Past Medical History:  Diagnosis Date   Breast cancer ([place]) 07/2021   right   Diabetes mellitus without complication (HCC)    Headache    migraines   Hypertension    Passed out     Past Surgical History:  Procedure Laterality Date   BREAST BIOPSY Right 07/07/2021   stereo bx, ribbon clip,  DUCTAL CARCINOMA IN SITU,   BREAST BIOPSY Right 07/07/2021   Stereo bx,X clip,  DUCTAL CARCINOMA IN SITU   BREAST LUMPECTOMY WITH NEEDLE LOCALIZATION Right 08/04/2021   Procedure: BREAST LUMPECTOMY WITH NEEDLE LOCALIZATION;  Surgeon: Robert Bellow, MD;  Location: [facility] ORS;  Service: General;  Laterality: Right;    Family History  Problem Relation Age of Onset   Hypertension Mother    Cancer Mother    Diabetes Mother    Hypertension Father    Heart attack Father    Diabetes Father    Stroke Brother    Sickle cell anemia Son    Breast cancer Neg Hx     Social History   Socioeconomic History   Marital status: Single    Spouse name: Not on file   Number  of children: Not on file   Years of education: Not on file   Highest education level: Not on file  Occupational History   Occupation: Surveyor, quantity: [employer]  Tobacco Use   Smoking status: Former    Years: 10.00    Types: Cigarettes    Quit date: 05/2021    Years since quitting: 0.5   Smokeless tobacco: Never  Vaping Use   Vaping Use: Never used  Substance and Sexual Activity   Alcohol use: Yes    Alcohol/week: 1.0 - 2.0 standard drink    Types: 1 - 2 Shots of liquor per week   Drug use: Yes    Types: Marijuana    Comment: last use 08/21/21   Sexual activity: Not on file  Other Topics Concern   Not on file  Social History Narrative   Sometimes has trouble getting to work, relies on other people to take the because [employer] bus does not run far enough, also has trouble falling asleep occassionally   Social Determinants of Radio broadcast assistant Strain: Not on file  Food Insecurity: No Food Insecurity   Worried About Charity fundraiser in the Last Year: Never true   [place] in the Last Year: Never true  Needs: Unmet Transportation Needs  ° Lack of Transportation (Medical): Yes  ° Lack of Transportation (Non-Medical): Yes  °Physical Activity: Not on file  °Stress: Not on file  °Social Connections: Not on file  °Intimate Partner Violence: Not on file  ° ° °Outpatient Medications Prior to Visit  °Medication Sig Dispense Refill  ° SILVADENE 1 % cream Apply 1 application topically to the affected area(s) 2 (two) times daily. 85 g 3  ° ondansetron (ZOFRAN ODT) 4 MG disintegrating tablet Dissolve 1 tablet (4 mg total) by mouth every 8 (eight) hours as needed for nausea or vomiting. (Patient taking differently: Take 4 mg by mouth every 8 (eight) hours as needed for nausea or vomiting. None in the last week) 20 tablet 0  ° potassium chloride SA (KLOR-CON) 20 MEQ tablet Take 1 tablet (20 mEq total) by mouth once daily. 10 tablet 0  ° famotidine (PEPCID) 20 MG tablet  Take 1 tablet (20 mg total) by mouth once daily. (Patient not taking: Reported on 12/05/2021) 30 tablet 1  ° amLODipine (NORVASC) 10 MG tablet Take 1 tablet (10 mg total) by mouth once daily. (Patient not taking: Reported on 12/05/2021) 90 tablet 0  ° atorvastatin (LIPITOR) 10 MG tablet Take 1 tablet (10 mg total) by mouth once daily. (Patient not taking: Reported on 12/05/2021) 90 tablet 0  ° chlorthalidone (HYGROTON) 25 MG tablet Take 1 tablet (25 mg total) by mouth once daily. (Patient not taking: Reported on 12/05/2021) 90 tablet 0  ° HYDROcodone-acetaminophen (NORCO/VICODIN) 5-325 MG tablet Take 1 tablet by mouth every 4 (four) hours as needed for moderate pain. 20 tablet 0  ° lisinopril (ZESTRIL) 20 MG tablet TAKE ONE TABLET BY MOUTH ONCE DAILY AT BEDTIME (Patient not taking: Reported on 12/05/2021) 90 tablet 0  ° metFORMIN (GLUCOPHAGE) 500 MG tablet TAKE ONE TABLET BY MOUTH EVERY DAY WITH BREAKFAST (Patient not taking: Reported on 12/05/2021) 90 tablet 0  ° °No facility-administered medications prior to visit.  ° ° °Allergies  °Allergen Reactions  ° Sulfamethoxazole-Trimethoprim   °  Breaks out in welts  ° ° °ROS °Review of Systems  °Constitutional: Negative.   °HENT: Negative.    °Eyes: Negative.   °Respiratory: Negative.    °Cardiovascular: Negative.   °Endocrine: Negative.   °Skin: Negative.   °Neurological: Negative.   °Psychiatric/Behavioral: Negative.    ° °  °Objective:  °  °Physical Exam °HENT:  °   Head: Normocephalic and atraumatic.  °   Mouth/Throat:  °   Mouth: Mucous membranes are moist.  °Eyes:  °   Extraocular Movements: Extraocular movements intact.  °   Conjunctiva/sclera: Conjunctivae normal.  °   Pupils: Pupils are equal, round, and reactive to light.  °Cardiovascular:  °   Rate and Rhythm: Normal rate and regular rhythm.  °   Pulses: Normal pulses.  °   Heart sounds: Normal heart sounds.  °Pulmonary:  °   Effort: Pulmonary effort is normal.  °   Breath sounds: Normal breath sounds.  °Skin: °    General: Skin is warm.  °Neurological:  °   General: No focal deficit present.  °   Mental Status: She is alert and oriented to person, place, and time. Mental status is at baseline.  °Psychiatric:     °   Mood and Affect: Mood normal.     °   Behavior: Behavior normal.     °   Thought Content: Thought content normal.     °     Judgment: Judgment normal.    BP (!) 153/85 (BP Location: Right Arm, Patient Position: Sitting, Cuff Size: Large)    Pulse 70    Temp 98.1 F (36.7 C) (Oral)    Resp 16    Ht 5' 5" (1.651 m)    Wt 259 lb 1.6 oz (117.5 kg)    LMP  (LMP Unknown)    SpO2 97%    BMI 43.12 kg/m  Wt Readings from Last 3 Encounters:  12/05/21 259 lb 1.6 oz (117.5 kg)  10/24/21 251 lb 1.6 oz (113.9 kg)  09/20/21 249 lb 9.6 oz (113.2 kg)  Encouraged weight loss   Health Maintenance Due  Topic Date Due   OPHTHALMOLOGY EXAM  Never done   Hepatitis C Screening  Never done   TETANUS/TDAP  Never done   Zoster Vaccines- Shingrix (1 of 2) Never done   COLONOSCOPY (Pts 45-12yr Insurance coverage will need to be confirmed)  Never done   COVID-19 Vaccine (2 - Moderna risk series) 01/29/2020    There are no preventive care reminders to display for this patient.  Lab Results  Component Value Date   TSH 1.490 06/04/2019   Lab Results  Component Value Date   WBC 7.9 09/20/2021   HGB 12.1 09/20/2021   HCT 38.0 09/20/2021   MCV 70 (L) 09/20/2021   PLT 237 09/20/2021   Lab Results  Component Value Date   NA 144 09/20/2021   K 3.4 (L) 09/20/2021   CO2 29 09/20/2021   GLUCOSE 129 (H) 09/20/2021   BUN 24 09/20/2021   CREATININE 0.80 09/20/2021   BILITOT 0.6 07/31/2021   ALKPHOS 69 07/31/2021   AST 19 07/31/2021   ALT 17 07/31/2021   PROT 7.6 07/31/2021   ALBUMIN 4.2 07/31/2021   CALCIUM 9.7 09/20/2021   ANIONGAP 10 07/31/2021   EGFR 86 09/20/2021   Lab Results  Component Value Date   CHOL 206 (H) 03/16/2021   Lab Results  Component Value Date   HDL 51 03/16/2021   Lab Results   Component Value Date   LDLCALC 122 (H) 03/16/2021   Lab Results  Component Value Date   TRIG 188 (H) 03/16/2021   Lab Results  Component Value Date   CHOLHDL 4.0 03/16/2021   Lab Results  Component Value Date   HGBA1C 5.5 06/28/2021      Assessment & Plan:   1. Essential hypertension - Her blood pressure is elevated, she will continue on current medication, DASH diet and exercise as tolerated. - amLODipine (NORVASC) 10 MG tablet; Take 1 tablet (10 mg total) by mouth once daily.  Dispense: 30 tablet; Refill: 1 - chlorthalidone (HYGROTON) 25 MG tablet; Take 1 tablet (25 mg total) by mouth once daily.  Dispense: 30 tablet; Refill: 1 - lisinopril (ZESTRIL) 20 MG tablet; TAKE ONE TABLET BY MOUTH ONCE DAILY AT BEDTIME  Dispense: 30 tablet; Refill: 1  2. Elevated lipids - She will continue on current medication, low fat/cholesterol diet and exercise as tolerated. - atorvastatin (LIPITOR) 10 MG tablet; Take 1 tablet (10 mg total) by mouth once daily.  Dispense: 30 tablet; Refill: 1         Follow-up:  No follow up visit, because she has active Medicaid. [place] well with her care.    Bettye Sitton EJerold Coombe NP

## 2021-12-07 ENCOUNTER — Ambulatory Visit: Payer: Medicaid Other | Admitting: Radiation Oncology

## 2021-12-08 ENCOUNTER — Other Ambulatory Visit: Payer: Self-pay

## 2021-12-11 ENCOUNTER — Other Ambulatory Visit: Payer: Self-pay

## 2021-12-12 ENCOUNTER — Other Ambulatory Visit: Payer: Self-pay

## 2021-12-12 ENCOUNTER — Telehealth: Payer: Self-pay | Admitting: Pharmacist

## 2021-12-12 NOTE — Telephone Encounter (Signed)
Patient has prescription drug coverage. No longer meets the eligibility requirements to obtain medication assistance from [facility]. Patient notified by letter. [place] [facility]

## 2021-12-14 ENCOUNTER — Ambulatory Visit
Admission: RE | Admit: 2021-12-14 | Discharge: 2021-12-14 | Disposition: A | Discharge status: Home / Self Care | Payer: Medicaid Other | Source: Ambulatory Visit | Attending: Radiation Oncology | Admitting: Radiation Oncology

## 2021-12-14 ENCOUNTER — Other Ambulatory Visit: Payer: Self-pay | Admitting: Emergency Medicine

## 2021-12-14 ENCOUNTER — Other Ambulatory Visit: Payer: Self-pay | Admitting: *Deleted

## 2021-12-14 ENCOUNTER — Other Ambulatory Visit: Payer: Self-pay

## 2021-12-14 ENCOUNTER — Ambulatory Visit: Payer: Medicaid Other | Admitting: Gerontology

## 2021-12-14 VITALS — BP 174/87 | HR 79 | Temp 98.1°F | Resp 18 | Ht 65.0 in | Wt 260.6 lb

## 2021-12-14 DIAGNOSIS — Z170 Estrogen receptor positive status [ER+]: Secondary | ICD-10-CM | POA: Diagnosis not present

## 2021-12-14 DIAGNOSIS — D0511 Intraductal carcinoma in situ of right breast: Secondary | ICD-10-CM | POA: Diagnosis present

## 2021-12-14 DIAGNOSIS — Z923 Personal history of irradiation: Secondary | ICD-10-CM | POA: Diagnosis not present

## 2021-12-14 MED ORDER — TAMOXIFEN CITRATE 20 MG PO TABS: 20.0000 mg | ORAL_TABLET | Freq: Every day | ORAL | 3 refills | Status: DC

## 2021-12-14 NOTE — Progress Notes (Signed)
Radiation Oncology Follow up Note  Name: Cindy Burke   Date:   12/14/2021 MRN:  [id number] DOB: Jan 04, 1966    This 56 y.o. 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  REFERRING PROVIDER: Langston Reusing, NP  HPI: Patient is a 56 year old female now at 1 month having a pleated whole breast radiation to her right breast for ER positive ductal carcinoma in situ.  Seen today in routine follow-up she is just having a little pulling of the breast associated with her scar she still has some hyperpigmentation of the skin.  She saw Dr. Grayland Ormond had not received her prescription for tamoxifen at this point and we are contacting his team to forward that to her..  COMPLICATIONS OF TREATMENT: none  FOLLOW UP COMPLIANCE: keeps appointments   PHYSICAL EXAM:  BP (!) 174/87    Pulse 79    Temp 98.1 F (36.7 C)    Resp 18    Ht 5\' 5"  (1.651 m)    Wt 260 lb 9.6 oz (118.2 kg)    LMP  (LMP Unknown)    BMI 43.37 kg/m  Lungs are clear to A&P cardiac examination essentially unremarkable with regular rate and rhythm. No dominant mass or nodularity is noted in either breast in 2 positions examined. Incision is well-healed. No axillary or supraclavicular adenopathy is appreciated. Cosmetic result is excellent.  RADIOLOGY RESULTS: No current films for review  PLAN: Present time patient is doing well 1 month out from whole breast radiation.  Cosmetic result is excellent.  Still some slight hyperpigmentation of the skin.  We are contacting Finn's team for forwarding her prescription for tamoxifen.  I have asked to see her back in 4 to 5 months for follow-up.  Patient knows to call with any concerns.  I would like to take this opportunity to thank you for allowing me to participate in the care of your patient.Noreene Filbert, MD

## 2022-01-21 NOTE — Progress Notes (Signed)
?[facility]  ?[phone] [phone] ? ?ID: Cindy Burke OB: [birth year]  MR#: [id number]  WKG#:[id number] ? ?Patient Care Team: ?Langston Reusing, NP as PCP - General (Gerontology) ?Rico Junker, RN as Registered Nurse ?Theodore Demark, RN as Registered Nurse ?Theodore Demark, RN as Oncology Nurse Navigator ? ?CHIEF COMPLAINT: ER positive DCIS, right breast. ? ?INTERVAL HISTORY: Patient returns to clinic today for routine 47-monthevaluation and to assess her toleration of tamoxifen.  She reports occasional hot flashes that do not affect her day-to-day activity, but otherwise is tolerating her treatments well.  She continues to have residual breast pain at the site of her lumpectomy.  She also is complaining of right leg pain particularly at night.  She otherwise feels well. She has no neurologic complaints.  She denies any recent fevers or illnesses.  She has a good appetite and denies weight loss.  She has no chest pain, shortness of breath, cough, or hemoptysis.  She denies any nausea, vomiting, constipation, or diarrhea.  She has no urinary complaints.  Patient offers no further specific complaints today. ? ?REVIEW OF SYSTEMS:   ?Review of Systems  ?Constitutional: Negative.  Negative for fever, malaise/fatigue and weight loss.  ?Respiratory: Negative.  Negative for cough, hemoptysis and shortness of breath.   ?Cardiovascular: Negative.  Negative for chest pain and leg swelling.  ?Gastrointestinal: Negative.  Negative for abdominal pain.  ?Genitourinary: Negative.  Negative for dysuria.  ?Musculoskeletal:  Positive for joint pain. Negative for back pain.  ?Skin: Negative.  Negative for rash.  ?Neurological:  Positive for sensory change. Negative for dizziness, focal weakness, weakness and headaches.  ?Psychiatric/Behavioral: Negative.  The patient is not nervous/anxious.   ? ?As per HPI. Otherwise, a complete review of systems is negative. ? ?PAST MEDICAL  HISTORY: ?Past Medical History:  ?Diagnosis Date  ? [place]  ? [place]  ? [place] ([place]   ? Headache   ? migraines  ? Hypertension   ? Passed out   ? ? ?PAST SURGICAL HISTORY: ?Past Surgical History:  ?Procedure Laterality Date  ? BREAST BIOPSY Right 07/07/2021  ? stereo bx, ribbon clip,  DUCTAL CARCINOMA IN SITU,  ? BREAST BIOPSY Right 07/07/2021  ? Stereo bx,X clip,  DUCTAL CARCINOMA IN SITU  ? BREAST LUMPECTOMY WITH NEEDLE LOCALIZATION Right 08/04/2021  ? Procedure: BREAST LUMPECTOMY WITH NEEDLE LOCALIZATION;  Surgeon: BRobert Bellow MD;  Location: [facility] ORS;  Service: General;  Laterality: Right;  ? ? ?FAMILY HISTORY: ?Family History  ?Problem Relation Age of Onset  ? Hypertension Mother   ? Cancer Mother   ? Diabetes Mother   ? Hypertension Father   ? Heart attack Father   ? Diabetes Father   ? Stroke Brother   ? Sickle cell anemia Son   ? Breast cancer Neg Hx   ? ? ?ADVANCED DIRECTIVES (Y/N):  N ? ?HEALTH MAINTENANCE: ?Social History  ? ?Tobacco Use  ? Smoking status: Former  ?  Years: 10.00  ?  Types: Cigarettes  ?  Quit date: 05/2021  ?  Years since quitting: 0.6  ? Smokeless tobacco: Never  ?Vaping Use  ? Vaping Use: Never used  ?Substance Use Topics  ? Alcohol use: Yes  ?  Alcohol/week: 1.0 - 2.0 standard drink  ?  Types: 1 - 2 Shots of liquor per week  ? Drug use: Yes  ?  Types: Marijuana  ?  Comment: last use 08/21/21  ? ? ?  Colonoscopy: ? PAP: ? Bone density: ? Lipid panel: ? ?Allergies  ?Allergen Reactions  ? Sulfamethoxazole-Trimethoprim   ?  Breaks out in welts  ? ? ?Current Outpatient Medications  ?Medication Sig Dispense Refill  ? amLODipine (NORVASC) 10 MG tablet Take 1 tablet (10 mg total) by mouth once daily. 30 tablet 1  ? atorvastatin (LIPITOR) 10 MG tablet Take 1 tablet (10 mg total) by mouth once daily. 30 tablet 1  ? chlorthalidone (HYGROTON) 25 MG tablet Take 1 tablet (25 mg total) by mouth once daily. 30 tablet 1  ? lisinopril (ZESTRIL)  20 MG tablet TAKE ONE TABLET BY MOUTH ONCE DAILY AT BEDTIME 30 tablet 1  ? SILVADENE 1 % cream Apply 1 application topically to the affected area(s) 2 (two) times daily. 85 g 3  ? tamoxifen (NOLVADEX) 20 MG tablet Take 1 tablet (20 mg total) by mouth daily. 90 tablet 3  ? famotidine (PEPCID) 20 MG tablet Take 1 tablet (20 mg total) by mouth once daily. (Patient not taking: Reported on 12/05/2021) 30 tablet 1  ? ?No current facility-administered medications for this visit.  ? ? ?OBJECTIVE: ?Vitals:  ? 01/23/22 1109  ?BP: (!) 150/81  ?Pulse: 67  ?Resp: 16  ?Temp: 97.9 ?F (36.6 ?C)  ?SpO2: 100%  ?   Body mass index is 44.48 kg/m?Marland Kitchen    ECOG FS:0 - Asymptomatic ? ?General: Well-developed, well-nourished, no acute distress. ?Eyes: Pink conjunctiva, anicteric sclera. ?HEENT: Normocephalic, moist mucous membranes. ?Breast: Exam deferred today. ?Lungs: No audible wheezing or coughing. ?Heart: Regular rate and rhythm. ?Abdomen: Soft, nontender, no obvious distention. ?Musculoskeletal: No edema, cyanosis, or clubbing. ?Neuro: Alert, answering all questions appropriately. Cranial nerves grossly intact. ?Skin: No rashes or petechiae noted. ?Psych: Normal affect. ? ?LAB RESULTS: ? ?Lab Results  ?Component Value Date  ? NA 144 09/20/2021  ? K 3.4 (L) 09/20/2021  ? CL 103 09/20/2021  ? CO2 29 09/20/2021  ? GLUCOSE 129 (H) 09/20/2021  ? BUN 24 09/20/2021  ? CREATININE 0.80 09/20/2021  ? CALCIUM 9.7 09/20/2021  ? PROT 7.6 07/31/2021  ? ALBUMIN 4.2 07/31/2021  ? AST 19 07/31/2021  ? ALT 17 07/31/2021  ? ALKPHOS 69 07/31/2021  ? BILITOT 0.6 07/31/2021  ? GFRNONAA >60 07/31/2021  ? GFRAA 86 06/22/2020  ? ? ?Lab Results  ?Component Value Date  ? WBC 7.9 09/20/2021  ? NEUTROABS 4.7 09/20/2021  ? HGB 12.1 09/20/2021  ? HCT 38.0 09/20/2021  ? MCV 70 (L) 09/20/2021  ? PLT 237 09/20/2021  ? ? ? ?STUDIES: ?No results found. ? ?ASSESSMENT: ER positive DCIS, right breast. ? ?PLAN:   ? ?ER positive DCIS, right breast: Lumpectomy completed on  August 04, 2021 confirmed the above-stated diagnosis with no invasive component noted.  Because of that, patient did not require adjuvant chemotherapy.  She completed adjuvant XRT in approximately January 2023.  Continue tamoxifen for a total of 5 years completing treatment in January 2028.  No further intervention is needed.  Return to clinic in 6 months for routine evaluation.   ?Hot flashes: Mild.  Continue tamoxifen as above.   ?Breast pain: Likely postsurgical.  Have instructed patient to follow-up with her surgeon if symptoms persist.   ?Leg pain: Unclear etiology and unrelated to her DCIS.  Possibly underlying arthritis.  Recommended patient will use Tylenol or ibuprofen sparingly.   ? ? ?Patient expressed understanding and was in agreement with this plan. She also understands that She can call clinic at any time with any  questions, concerns, or complaints.  ? ? Cancer Staging  ?Ductal carcinoma in situ (DCIS) of right breast ?Staging form: Breast, AJCC 8th Edition ?- Clinical stage from 07/15/2021: Stage 0 (cTis (DCIS), cN0, cM0, ER+, PR+, HER2-) - Signed by Lloyd Huger, MD on 07/15/2021 ?Stage prefix: Initial diagnosis ?Nuclear grade: G1 ? ? ?Lloyd Huger, MD   01/23/2022 12:06 PM ? ? ? ? ?

## 2022-01-23 ENCOUNTER — Inpatient Hospital Stay: Payer: Medicaid Other | Attending: Oncology | Admitting: Oncology

## 2022-01-23 VITALS — BP 150/81 | HR 67 | Temp 97.9°F | Resp 16 | Ht 65.0 in | Wt 267.3 lb

## 2022-01-23 DIAGNOSIS — R232 Flushing: Secondary | ICD-10-CM | POA: Insufficient documentation

## 2022-01-23 DIAGNOSIS — N644 Mastodynia: Secondary | ICD-10-CM | POA: Insufficient documentation

## 2022-01-23 DIAGNOSIS — M79606 Pain in leg, unspecified: Secondary | ICD-10-CM | POA: Insufficient documentation

## 2022-01-23 DIAGNOSIS — F1210 Cannabis abuse, uncomplicated: Secondary | ICD-10-CM | POA: Diagnosis not present

## 2022-01-23 DIAGNOSIS — D0511 Intraductal carcinoma in situ of right breast: Secondary | ICD-10-CM | POA: Diagnosis present

## 2022-01-23 DIAGNOSIS — Z923 Personal history of irradiation: Secondary | ICD-10-CM | POA: Diagnosis not present

## 2022-01-23 DIAGNOSIS — Z170 Estrogen receptor positive status [ER+]: Secondary | ICD-10-CM | POA: Insufficient documentation

## 2022-01-23 DIAGNOSIS — Z79810 Long term (current) use of selective estrogen receptor modulators (SERMs): Secondary | ICD-10-CM | POA: Diagnosis not present

## 2022-01-23 DIAGNOSIS — Z87891 Personal history of nicotine dependence: Secondary | ICD-10-CM | POA: Diagnosis not present

## 2022-01-23 NOTE — Progress Notes (Signed)
Pt reports hot flashes, pain in right leg and right breast/axilla area that results in poor sleep.  ?

## 2022-03-21 ENCOUNTER — Emergency Department
Admission: EM | Admit: 2022-03-21 | Discharge: 2022-03-22 | Disposition: A | Discharge status: Home / Self Care | Payer: Medicaid Other | Attending: Student in an Organized Health Care Education/Training Program | Admitting: Student in an Organized Health Care Education/Training Program

## 2022-03-21 ENCOUNTER — Emergency Department: Payer: Medicaid Other

## 2022-03-21 DIAGNOSIS — F32A Depression, unspecified: Secondary | ICD-10-CM | POA: Diagnosis not present

## 2022-03-21 DIAGNOSIS — I10 Essential (primary) hypertension: Secondary | ICD-10-CM | POA: Insufficient documentation

## 2022-03-21 DIAGNOSIS — E785 Hyperlipidemia, unspecified: Secondary | ICD-10-CM | POA: Diagnosis present

## 2022-03-21 DIAGNOSIS — R413 Other amnesia: Secondary | ICD-10-CM | POA: Insufficient documentation

## 2022-03-21 DIAGNOSIS — R825 Elevated urine levels of drugs, medicaments and biological substances: Secondary | ICD-10-CM | POA: Diagnosis not present

## 2022-03-21 DIAGNOSIS — R1011 Right upper quadrant pain: Secondary | ICD-10-CM | POA: Insufficient documentation

## 2022-03-21 DIAGNOSIS — Z8659 Personal history of other mental and behavioral disorders: Secondary | ICD-10-CM

## 2022-03-21 DIAGNOSIS — R928 Other abnormal and inconclusive findings on diagnostic imaging of breast: Secondary | ICD-10-CM | POA: Diagnosis present

## 2022-03-21 DIAGNOSIS — Z0000 Encounter for general adult medical examination without abnormal findings: Secondary | ICD-10-CM

## 2022-03-21 DIAGNOSIS — R55 Syncope and collapse: Secondary | ICD-10-CM | POA: Diagnosis present

## 2022-03-21 DIAGNOSIS — Z87891 Personal history of nicotine dependence: Secondary | ICD-10-CM | POA: Insufficient documentation

## 2022-03-21 DIAGNOSIS — R4182 Altered mental status, unspecified: Secondary | ICD-10-CM | POA: Insufficient documentation

## 2022-03-21 DIAGNOSIS — E119 Type 2 diabetes mellitus without complications: Secondary | ICD-10-CM | POA: Insufficient documentation

## 2022-03-21 LAB — BLOOD GAS, VENOUS
Acid-base deficit: 0.5 mmol/L (ref 0.0–2.0)
Bicarbonate: 24.2 mmol/L (ref 20.0–28.0)
O2 Saturation: 69.8 %
Patient temperature: 37
pCO2, Ven: 39 mmHg — ABNORMAL LOW (ref 44–60)
pH, Ven: 7.4 (ref 7.25–7.43)
pO2, Ven: 44 mmHg (ref 32–45)

## 2022-03-21 LAB — HEPATIC FUNCTION PANEL
ALT: 13 U/L (ref 0–44)
AST: 17 U/L (ref 15–41)
Albumin: 3.6 g/dL (ref 3.5–5.0)
Alkaline Phosphatase: 60 U/L (ref 38–126)
Bilirubin, Direct: 0.1 mg/dL (ref 0.0–0.2)
Indirect Bilirubin: 0.7 mg/dL (ref 0.3–0.9)
Total Bilirubin: 0.8 mg/dL (ref 0.3–1.2)
Total Protein: 7.1 g/dL (ref 6.5–8.1)

## 2022-03-21 LAB — CBC
HCT: 37.8 % (ref 36.0–46.0)
Hemoglobin: 11.5 g/dL — ABNORMAL LOW (ref 12.0–15.0)
MCH: 21.6 pg — ABNORMAL LOW (ref 26.0–34.0)
MCHC: 30.4 g/dL (ref 30.0–36.0)
MCV: 70.9 fL — ABNORMAL LOW (ref 80.0–100.0)
Platelets: 234 10*3/uL (ref 150–400)
RBC: 5.33 MIL/uL — ABNORMAL HIGH (ref 3.87–5.11)
RDW: 15.2 % (ref 11.5–15.5)
WBC: 8.5 10*3/uL (ref 4.0–10.5)
nRBC: 0 % (ref 0.0–0.2)

## 2022-03-21 LAB — TROPONIN I (HIGH SENSITIVITY)
Troponin I (High Sensitivity): 17 ng/L (ref <18)
Troponin I (High Sensitivity): 6 ng/L (ref <18)

## 2022-03-21 LAB — AMMONIA: Ammonia: 17 umol/L (ref 9–35)

## 2022-03-21 LAB — URINALYSIS, ROUTINE W REFLEX MICROSCOPIC
Bilirubin Urine: NEGATIVE
Glucose, UA: NEGATIVE mg/dL
Hgb urine dipstick: NEGATIVE
Ketones, ur: NEGATIVE mg/dL
Leukocytes,Ua: NEGATIVE
Nitrite: NEGATIVE
Protein, ur: NEGATIVE mg/dL
Specific Gravity, Urine: 1.008 (ref 1.005–1.030)
pH: 6 (ref 5.0–8.0)

## 2022-03-21 LAB — BASIC METABOLIC PANEL
Anion gap: 9 (ref 5–15)
BUN: 25 mg/dL — ABNORMAL HIGH (ref 6–20)
CO2: 26 mmol/L (ref 22–32)
Calcium: 9.4 mg/dL (ref 8.9–10.3)
Chloride: 104 mmol/L (ref 98–111)
Creatinine, Ser: 1.01 mg/dL — ABNORMAL HIGH (ref 0.44–1.00)
GFR, Estimated: >60 mL/min (ref >60)
Glucose, Bld: 118 mg/dL — ABNORMAL HIGH (ref 70–99)
Potassium: 3.1 mmol/L — ABNORMAL LOW (ref 3.5–5.1)
Sodium: 139 mmol/L (ref 135–145)

## 2022-03-21 LAB — CBG MONITORING, ED: Glucose-Capillary: 78 mg/dL (ref 70–99)

## 2022-03-21 LAB — URINE DRUG SCREEN, QUALITATIVE (ARMC ONLY)
Amphetamines, Ur Screen: NOT DETECTED
Barbiturates, Ur Screen: NOT DETECTED
Benzodiazepine, Ur Scrn: NOT DETECTED
Cannabinoid 50 Ng, Ur ~~LOC~~: POSITIVE — AB
Cocaine Metabolite,Ur ~~LOC~~: NOT DETECTED
MDMA (Ecstasy)Ur Screen: NOT DETECTED
Methadone Scn, Ur: NOT DETECTED
Opiate, Ur Screen: NOT DETECTED
Phencyclidine (PCP) Ur S: NOT DETECTED
Tricyclic, Ur Screen: NOT DETECTED

## 2022-03-21 LAB — ETHANOL: Alcohol, Ethyl (B): <10 mg/dL (ref <10)

## 2022-03-21 MED ORDER — SODIUM CHLORIDE 0.9 % IV BOLUS
500.0000 mL | Freq: Once | INTRAVENOUS | Status: AC
  Administered 2022-03-21: 500 mL via INTRAVENOUS

## 2022-03-21 MED ORDER — ACETAMINOPHEN 500 MG PO TABS
1000.0000 mg | ORAL_TABLET | Freq: Once | ORAL | Status: AC
Start: 2022-03-21 — End: 2022-03-21
  Administered 2022-03-21: 1000 mg via ORAL
  Filled 2022-03-21: qty 2

## 2022-03-21 MED ORDER — IOHEXOL 300 MG/ML  SOLN
100.0000 mL | Freq: Once | INTRAMUSCULAR | Status: AC | PRN
Start: 2022-03-21 — End: 2022-03-21
  Administered 2022-03-21: 100 mL via INTRAVENOUS

## 2022-03-21 MED ORDER — LORAZEPAM 2 MG/ML IJ SOLN
0.5000 mg | Freq: Once | INTRAMUSCULAR | Status: AC
  Administered 2022-03-21: 0.5 mg via INTRAVENOUS
  Filled 2022-03-21: qty 1

## 2022-03-21 NOTE — ED Notes (Signed)
HOB adjusted for pt. Call light within reach. Pt has no further needs at this time.

## 2022-03-21 NOTE — ED Notes (Signed)
ED Provider at bedside. 

## 2022-03-21 NOTE — ED Triage Notes (Signed)
Pt BIB [facility] from home C/O "feeling faint." Per EMS, pt started new hormone pill last week. Pt reports "peeing blood" since Sunday and generalized abdominal pain X 2 weeks. Pt appears to be repeatedly falling asleep in triage. Dr. Quentin Cornwall aware.

## 2022-03-21 NOTE — ED Provider Notes (Signed)
[facility] Provider Note    Event Date/Time   First MD Initiated Contact with Patient 03/21/22 1631     (approximate)   History   Altered Mental Status  Level V CaveatL  AMS  HPI  Cindy Burke is a 56 y.o. female history of diabetes history of anxiety as well as breast cancer on hormone therapy presents to the ER for evaluation of altered mental status.  According to family she has been intermittently confused all day dozing off going unresponsive for a few moments and then gasping for breath acting confused and amnestic to the event.  She states that she does have some abdominal pain.  Denies any shortness of breath or chest discomfort no headaches.  Denies any missed medication doses.  Denies taking any excess medications.     Physical Exam   Triage Vital Signs: ED Triage Vitals  Enc Vitals Group     BP 03/21/22 1605 138/79     Pulse Rate 03/21/22 1605 94     Resp 03/21/22 1605 18     Temp 03/21/22 1605 98.6 F (37 C)     Temp Source 03/21/22 1605 Oral     SpO2 03/21/22 1603 98 %     Weight --      Height --      Head Circumference --      Peak Flow --      Pain Score 03/21/22 1606 8     Pain Loc --      Pain Edu? --      Excl. in [place]? --     Most recent vital signs: Vitals:   03/21/22 2230 03/21/22 2300  BP: 112/74 126/72  Pulse: 80 84  Resp: 14 16  Temp:    SpO2: 100% 99%     Constitutional: Encephalopathic intermittently dozing off then sudden gasping episode looking about the room.  No shaking. Eyes: Conjunctivae are normal.  Head: Atraumatic. Nose: No congestion/rhinnorhea. Mouth/Throat: Mucous membranes are moist.   Neck: Painless ROM.  Cardiovascular:   Good peripheral circulation. No m/g/r Respiratory: Normal respiratory effort.  No retractions.  Gastrointestinal: Soft mild tenderness palpation of the right upper quadrant. Musculoskeletal:  no deformity Neurologic:  MAE spontaneously. No gross focal neurologic  deficits are appreciated.  Skin:  Skin is warm, dry and intact. No rash noted. Psychiatric: Mood and affect are normal. Speech and behavior are normal.    ED Results / Procedures / Treatments   Labs (all labs ordered are listed, but only abnormal results are displayed) Labs Reviewed  BASIC METABOLIC PANEL - Abnormal; Notable for the following components:      Result Value   Potassium 3.1 (*)    Glucose, Bld 118 (*)    BUN 25 (*)    Creatinine, Ser 1.01 (*)    All other components within normal limits  CBC - Abnormal; Notable for the following components:   RBC 5.33 (*)    Hemoglobin 11.5 (*)    MCV 70.9 (*)    MCH 21.6 (*)    All other components within normal limits  URINALYSIS, ROUTINE W REFLEX MICROSCOPIC - Abnormal; Notable for the following components:   Color, Urine YELLOW (*)    APPearance CLEAR (*)    All other components within normal limits  URINE DRUG SCREEN, QUALITATIVE ([facility] ONLY) - Abnormal; Notable for the following components:   Cannabinoid 50 Ng, Ur [place] POSITIVE (*)    All other components within normal limits  BLOOD GAS, VENOUS - Abnormal; Notable for the following components:   pCO2, Ven 39 (*)    All other components within normal limits  HEPATIC FUNCTION PANEL  AMMONIA  ETHANOL  CBG MONITORING, ED  TROPONIN I (HIGH SENSITIVITY)  TROPONIN I (HIGH SENSITIVITY)     EKG  ED ECG REPORT I, Merlyn Lot, the attending physician, personally viewed and interpreted this ECG.   Date: 03/21/2022  EKG Time: 16:05  Rate: 90  Rhythm: sinus  Axis: normal  Intervals:normal intervals,  ST&T Change: no stemi, no depressions    RADIOLOGY Please see ED Course for my review and interpretation.  I personally reviewed all radiographic images ordered to evaluate for the above acute complaints and reviewed radiology reports and findings.  These findings were personally discussed with the patient.  Please see medical record for radiology  report.    PROCEDURES:  Critical Care performed: No  Procedures   MEDICATIONS ORDERED IN ED: Medications  sodium chloride 0.9 % bolus 500 mL (0 mLs Intravenous Stopped 03/21/22 1749)  LORazepam (ATIVAN) injection 0.5 mg (0.5 mg Intravenous Given 03/21/22 1706)  iohexol (OMNIPAQUE) 300 MG/ML solution 100 mL (100 mLs Intravenous Contrast Given 03/21/22 1731)  acetaminophen (TYLENOL) tablet 1,000 mg (1,000 mg Oral Given 03/21/22 2150)     IMPRESSION / MDM / ASSESSMENT AND PLAN / ED COURSE  I reviewed the triage vital signs and the nursing notes.                              Differential diagnosis includes, but is not limited to, Dehydration, sepsis, pna, uti, hypoglycemia, cva, drug effect, withdrawal, encephalitis   Patient with very bizarre presentation with very broad differential.  She is afebrile and doubt sepsis not meningitic but altered possible metabolic or medication related given recent initiation of hormone therapy in the setting of breast cancer.  She is not tachycardic not hypoxic lower suspicion for PE ACS or CHF.  Her abdominal exam is tender therefore will order CT imaging.  Will order CT head.  This presenting complaint may reflect a potentially life-threatening illness therefore the patient will be placed on continuous pulse oximetry and telemetry for monitoring.  Laboratory evaluation will be sent to evaluate for the above complaints.      Clinical Course as of 03/21/22 2334  Wed Mar 21, 2022  1756 CT head on my interpretation shows no evidence of head bleed.  CT abdomen pelvis on my interpretation shows no acute intra-abdominal process.  Will await formal radiology report. [PR]  2022 Given reassuring work-up went back to speak with patient still somewhat altered given her history of breast cancer will order MRI on further conversation with patient and family it sounds like she is undergoing quite a bit of stressors at home has been reportedly feeling very depressed.   Will order MRI to further rule out CVA or other cause of her encephalopathy.  Her UDS was positive for marijuana patient denies using any recently. [PR]  2215 MRI is reassuring.  Patient denies any SI or HI.  On reassessment does appear improved.  States that she would like psych consult.  She will be kept voluntary. [PR]    Clinical Course User Index [PR] Merlyn Lot, MD      FINAL CLINICAL IMPRESSION(S) / ED DIAGNOSES   Final diagnoses:  Altered mental status, unspecified altered mental status type     Rx / DC Orders   ED  Discharge Orders     None        Note:  This document was prepared using Dragon voice recognition software and may include unintentional dictation errors.    Merlyn Lot, MD 03/21/22 [phone]

## 2022-03-22 DIAGNOSIS — F32A Depression, unspecified: Secondary | ICD-10-CM

## 2022-03-22 NOTE — ED Provider Notes (Signed)
-----------------------------------------   2:42 AM on 03/22/2022 ----------------------------------------- Patient evaluated by psychiatry and cleared for discharge home, does not represent a threat to herself or others at this time.  She was counseled to establish care with outpatient psychiatry, was also counseled to follow-up with her PCP.  Patient agrees with plan.   Blake Divine, MD 03/22/22 [phone]

## 2022-03-22 NOTE — ED Notes (Signed)
Patient currently resting quietly in room with eyes closed.  Son has been made aware of need for ride.

## 2022-03-22 NOTE — ED Notes (Signed)
Son called and made aware that patient is ready for discharge

## 2022-03-22 NOTE — Consult Note (Signed)
[facility] Consult   Reason for Consult: Altered Mental Status Referring Physician:  Dr. Quentin Cornwall Patient Identification: CAMMY SANJURJO MRN:  [id number] Principal Diagnosis: <principal problem not specified> Diagnosis:  Active Problems:   Syncope   Elevated lipids   Health care maintenance   History of anxiety   Abnormal mammogram   Total Time spent with patient: 1 hour  Subjective: "I am having relationship problems." HEIDEE AUDI is a 56 y.o. female patient presented to [facility] ED via [facility] and voluntary. Per the triage nurses note, Pt BIB [facility] from home C/O "feeling faint." Per EMS, pt started new hormone pill last week. Pt reports "peeing blood" since Sunday and generalized abdominal pain X 2 weeks. Pt appears to be repeatedly falling asleep in triage. Dr. Quentin Cornwall aware. The patient asked the EDP if she could speak with someone in psychiatry. The psych team met with the patient; her issue is relationship problems. The patient discussed that she and her partner were not getting along and needed to speak to someone. After our interaction, she voiced, "I am feeling better." This provider saw The patient face-to-face; the chart was reviewed, and consulted with Dr. Charna Archer on 03/22/2022 due to the patient's care. It was discussed with the EDP that the patient does not meet the criteria to be admitted to the psychiatric inpatient unit.  On evaluation, the patient is alert and oriented x 4, calm, cooperative, and mood-congruent with affect. The patient does not appear to be responding to internal or external stimuli. Neither is the patient presenting with any delusional thinking. The patient denies auditory or visual hallucinations. The patient denies any suicidal, homicidal, or self-harm ideations. The patient is not presenting with any psychotic or paranoid behaviors. During an encounter with the patient, she could answer questions appropriately.  HPI: Per Dr. Quentin Cornwall, [name] is a 56 y.o. female history of diabetes history of anxiety as well as breast cancer on hormone therapy presents to the ER for evaluation of altered mental status.  According to family she has been intermittently confused all day dozing off going unresponsive for a few moments and then gasping for breath acting confused and amnestic to the event.  She states that she does have some abdominal pain.  Denies any shortness of breath or chest discomfort no headaches.  Denies any missed medication doses.  Denies taking any excess medications.  Past Psychiatric History: History reviewed. No pertinent past psychiatric history  Risk to Self:   Risk to Others:   Prior Inpatient Therapy:   Prior Outpatient Therapy:    Past Medical History:  Past Medical History:  Diagnosis Date   Breast cancer ([place]) 07/2021   right   Diabetes mellitus without complication ([place])    Headache    migraines   Hypertension    Passed out     Past Surgical History:  Procedure Laterality Date   BREAST BIOPSY Right 07/07/2021   stereo bx, ribbon clip,  DUCTAL CARCINOMA IN SITU,   BREAST BIOPSY Right 07/07/2021   Stereo bx,X clip,  DUCTAL CARCINOMA IN SITU   BREAST LUMPECTOMY WITH NEEDLE LOCALIZATION Right 08/04/2021   Procedure: BREAST LUMPECTOMY WITH NEEDLE LOCALIZATION;  Surgeon: Robert Bellow, MD;  Location: [facility] ORS;  Service: General;  Laterality: Right;   Family History:  Family History  Problem Relation Age of Onset   Hypertension Mother    Cancer Mother    Diabetes Mother    Hypertension Father    Heart attack  Father    Diabetes Father    Stroke Brother    Sickle cell anemia Son    Breast cancer Neg Hx    Family Psychiatric  History:  Social History:  Social History   Substance and Sexual Activity  Alcohol Use Yes   Alcohol/week: 1.0 - 2.0 standard drink   Types: 1 - 2 Shots of liquor per week     Social History   Substance and Sexual Activity  Drug Use Yes   Types: Marijuana    Comment: last use 08/21/21    Social History   Socioeconomic History   Marital status: Single    Spouse name: Not on file   Number of children: Not on file   Years of education: Not on file   Highest education level: Not on file  Occupational History   Occupation: Surveyor, quantity: [employer]  Tobacco Use   Smoking status: Former    Years: 10.00    Types: Cigarettes    Quit date: 05/2021    Years since quitting: 0.8   Smokeless tobacco: Never  Vaping Use   Vaping Use: Never used  Substance and Sexual Activity   Alcohol use: Yes    Alcohol/week: 1.0 - 2.0 standard drink    Types: 1 - 2 Shots of liquor per week   Drug use: Yes    Types: Marijuana    Comment: last use 08/21/21   Sexual activity: Not on file  Other Topics Concern   Not on file  Social History Narrative   Sometimes has trouble getting to work, relies on other people to take the because [employer] bus does not run far enough, also has trouble falling asleep occassionally   Social Determinants of Radio broadcast assistant Strain: Not on file  Food Insecurity: Not on file  Transportation Needs: Not on file  Physical Activity: Not on file  Stress: Not on file  Social Connections: Not on file   Additional Social History:    Allergies:   Allergies  Allergen Reactions   Sulfamethoxazole-Trimethoprim     Breaks out in welts    Labs:  Results for orders placed or performed during the hospital encounter of 03/21/22 (from the past 48 hour(s))  Basic metabolic panel     Status: Abnormal   Collection Time: 03/21/22  4:03 PM  Result Value Ref Range   Sodium 139 135 - 145 mmol/L   Potassium 3.1 (L) 3.5 - 5.1 mmol/L   Chloride 104 98 - 111 mmol/L   CO2 26 22 - 32 mmol/L   Glucose, Bld 118 (H) 70 - 99 mg/dL    Comment: Glucose reference range applies only to samples taken after fasting for at least 8 hours.   BUN 25 (H) 6 - 20 mg/dL   Creatinine, Ser 1.01 (H) 0.44 - 1.00 mg/dL   Calcium 9.4 8.9 - 10.3 mg/dL    GFR, Estimated >60 >60 mL/min    Comment: (NOTE) Calculated using the CKD-EPI Creatinine Equation (2021)    Anion gap 9 5 - 15    Comment: Performed at [facility], [address] [place],  [address]  CBC     Status: Abnormal   Collection Time: 03/21/22  4:03 PM  Result Value Ref Range   WBC 8.5 4.0 - 10.5 K/uL   RBC 5.33 (H) 3.87 - 5.11 MIL/uL   Hemoglobin 11.5 (L) 12.0 - 15.0 g/dL   HCT 37.8 36.0 - 46.0 %   MCV  70.9 (L) 80.0 - 100.0 fL   MCH 21.6 (L) 26.0 - 34.0 pg   MCHC 30.4 30.0 - 36.0 g/dL   RDW 15.2 11.5 - 15.5 %   Platelets 234 150 - 400 K/uL   nRBC 0.0 0.0 - 0.2 %    Comment: Performed at [facility], [address] [place], [place] [address]  Urinalysis, Routine w reflex microscopic Urine, Clean Catch     Status: Abnormal   Collection Time: 03/21/22  4:03 PM  Result Value Ref Range   Color, Urine YELLOW (A) YELLOW   APPearance CLEAR (A) CLEAR   Specific Gravity, Urine 1.008 1.005 - 1.030   pH 6.0 5.0 - 8.0   Glucose, UA NEGATIVE NEGATIVE mg/dL   Hgb urine dipstick NEGATIVE NEGATIVE   Bilirubin Urine NEGATIVE NEGATIVE   Ketones, ur NEGATIVE NEGATIVE mg/dL   Protein, ur NEGATIVE NEGATIVE mg/dL   Nitrite NEGATIVE NEGATIVE   Leukocytes,Ua NEGATIVE NEGATIVE    Comment: Performed at [facility], [address], [place], [place] [address]  Urine Drug Screen, Qualitative ([facility] only)     Status: Abnormal   Collection Time: 03/21/22  4:20 PM  Result Value Ref Range   Tricyclic, Ur Screen NONE DETECTED NONE DETECTED   Amphetamines, Ur Screen NONE DETECTED NONE DETECTED   MDMA (Ecstasy)Ur Screen NONE DETECTED NONE DETECTED   Cocaine Metabolite,Ur [place] NONE DETECTED NONE DETECTED   Opiate, Ur Screen NONE DETECTED NONE DETECTED   Phencyclidine (PCP) Ur S NONE DETECTED NONE DETECTED   Cannabinoid 50 Ng, Ur [place] POSITIVE (A) NONE DETECTED   Barbiturates, Ur Screen NONE DETECTED NONE DETECTED   Benzodiazepine, Ur Scrn NONE DETECTED NONE  DETECTED   Methadone Scn, Ur NONE DETECTED NONE DETECTED    Comment: (NOTE) Tricyclics + metabolites, urine    Cutoff 1000 ng/mL Amphetamines + metabolites, urine  Cutoff 1000 ng/mL MDMA (Ecstasy), urine              Cutoff 500 ng/mL Cocaine Metabolite, urine          Cutoff 300 ng/mL Opiate + metabolites, urine        Cutoff 300 ng/mL Phencyclidine (PCP), urine         Cutoff 25 ng/mL Cannabinoid, urine                 Cutoff 50 ng/mL Barbiturates + metabolites, urine  Cutoff 200 ng/mL Benzodiazepine, urine              Cutoff 200 ng/mL Methadone, urine                   Cutoff 300 ng/mL  The urine drug screen provides only a preliminary, unconfirmed analytical test result and should not be used for non-medical purposes. Clinical consideration and professional judgment should be applied to any positive drug screen result due to possible interfering substances. A more specific alternate chemical method must be used in order to obtain a confirmed analytical result. Gas chromatography / mass spectrometry (GC/MS) is the preferred confirm atory method. Performed at [facility], [address] [place], [place] [address]   Hepatic function panel     Status: None   Collection Time: 03/21/22  6:12 PM  Result Value Ref Range   Total Protein 7.1 6.5 - 8.1 g/dL   Albumin 3.6 3.5 - 5.0 g/dL   AST 17 15 - 41 U/L   ALT 13 0 - 44 U/L   Alkaline Phosphatase 60 38 -  126 U/L   Total Bilirubin 0.8 0.3 - 1.2 mg/dL   Bilirubin, Direct 0.1 0.0 - 0.2 mg/dL   Indirect Bilirubin 0.7 0.3 - 0.9 mg/dL    Comment: Performed at [facility], [place], [place] [address]  Troponin I (High Sensitivity)     Status: None   Collection Time: 03/21/22  6:12 PM  Result Value Ref Range   Troponin I (High Sensitivity) 17 <18 ng/L    Comment: (NOTE) Elevated high sensitivity troponin I (hsTnI) values and significant  changes across serial measurements may suggest ACS but many  other  chronic and acute conditions are known to elevate hsTnI results.  Refer to the "Links" section for chest pain algorithms and additional  guidance. Performed at [facility], [address] [place], [place] [address]   Blood gas, venous     Status: Abnormal   Collection Time: 03/21/22  6:12 PM  Result Value Ref Range   pH, Ven 7.4 7.25 - 7.43   pCO2, Ven 39 (L) 44 - 60 mmHg   pO2, Ven 44 32 - 45 mmHg   Bicarbonate 24.2 20.0 - 28.0 mmol/L   Acid-base deficit 0.5 0.0 - 2.0 mmol/L   O2 Saturation 69.8 %   Patient temperature 37.0    Collection site VEIN     Comment: Performed at [facility], [address] [place], [place] [address]  Ammonia     Status: None   Collection Time: 03/21/22  6:12 PM  Result Value Ref Range   Ammonia 17 9 - 35 umol/L    Comment: Performed at [facility], [address] [place], [place] [address]  Ethanol     Status: None   Collection Time: 03/21/22  6:12 PM  Result Value Ref Range   Alcohol, Ethyl (B) <10 <10 mg/dL    Comment: (NOTE) Lowest detectable limit for serum alcohol is 10 mg/dL.  For medical purposes only. Performed at [facility], [address] [place], [place] [address]   CBG monitoring, ED     Status: None   Collection Time: 03/21/22  7:23 PM  Result Value Ref Range   Glucose-Capillary 78 70 - 99 mg/dL    Comment: Glucose reference range applies only to samples taken after fasting for at least 8 hours.  Troponin I (High Sensitivity)     Status: None   Collection Time: 03/21/22  8:13 PM  Result Value Ref Range   Troponin I (High Sensitivity) 6 <18 ng/L    Comment: (NOTE) Elevated high sensitivity troponin I (hsTnI) values and significant  changes across serial measurements may suggest ACS but many other  chronic and acute conditions are known to elevate hsTnI results.  Refer to the "Links" section for chest pain algorithms and additional  guidance. Performed at [facility], [address] [place],  [address]     No current facility-administered medications for this encounter.   Current Outpatient Medications  Medication Sig Dispense Refill   amLODipine (NORVASC) 10 MG tablet Take 1 tablet (10 mg total) by mouth once daily. 30 tablet 1   atorvastatin (LIPITOR) 10 MG tablet Take 1 tablet (10 mg total) by mouth once daily. 30 tablet 1   chlorthalidone (HYGROTON) 25 MG tablet Take 1 tablet (25 mg total) by mouth once daily. 30 tablet 1   famotidine (PEPCID) 20 MG tablet Take 1 tablet (20 mg total) by mouth once daily. (Patient not taking: Reported on 12/05/2021) 30  tablet 1   lisinopril (ZESTRIL) 20 MG tablet TAKE ONE TABLET BY MOUTH ONCE DAILY AT BEDTIME 30 tablet 1   SILVADENE 1 % cream Apply 1 application topically to the affected area(s) 2 (two) times daily. 85 g 3   tamoxifen (NOLVADEX) 20 MG tablet Take 1 tablet (20 mg total) by mouth daily. 90 tablet 3    Musculoskeletal: Strength & Muscle Tone: within normal limits Gait & Station: normal Patient leans: Backward Psychiatric Specialty Exam:  Presentation  General Appearance: Appropriate for Environment  Eye Contact:Good  Speech:Clear and Coherent  Speech Volume:Normal  Handedness:Right   Mood and Affect  Mood:Depressed  Affect:Depressed   Thought Process  Thought Processes:Coherent  Descriptions of Associations:Intact  Orientation:Full (Time, Place and Person)  Thought Content:Logical  History of Schizophrenia/Schizoaffective disorder:No data recorded Duration of Psychotic Symptoms:No data recorded Hallucinations:Hallucinations: None  Ideas of Reference:None  Suicidal Thoughts:Suicidal Thoughts: No  Homicidal Thoughts:Homicidal Thoughts: No   Sensorium  Memory:Immediate Good; Recent Good; Remote Good  Judgment:Good  Insight:Fair   Executive Functions  Concentration:Good  Attention Span:Good  [place] of  Knowledge:Good  Language:Good   Psychomotor Activity  Psychomotor Activity:Psychomotor Activity: Normal   Assets  Assets:Communication Skills; Desire for Improvement; Physical Health; Resilience; Social Support   Sleep  Sleep:Sleep: Good Number of Hours of Sleep: 8   Physical Exam: Physical Exam Vitals and nursing note reviewed.  Constitutional:      Appearance: Normal appearance. She is obese.  HENT:     Head: Normocephalic and atraumatic.     Right Ear: External ear normal.     Left Ear: External ear normal.     Nose: Nose normal.  Cardiovascular:     Rate and Rhythm: Normal rate.     Pulses: Normal pulses.  Pulmonary:     Effort: Pulmonary effort is normal.  Musculoskeletal:        General: Normal range of motion.  Neurological:     General: No focal deficit present.     Mental Status: She is alert and oriented to person, place, and time.  Psychiatric:        Attention and Perception: Attention and perception normal.        Mood and Affect: Mood is anxious and depressed.        Speech: Speech normal.        Behavior: Behavior normal. Behavior is cooperative.        Thought Content: Thought content normal.        Cognition and Memory: Cognition and memory normal.        Judgment: Judgment normal.   Review of Systems  Psychiatric/Behavioral:  Positive for depression. The patient is nervous/anxious.   All other systems reviewed and are negative. Blood pressure 126/72, pulse 84, temperature 98.6 F (37 C), temperature source Oral, resp. rate 16, SpO2 99 %. There is no height or weight on file to calculate BMI.  Treatment Plan Summary: Plan Patient does not meet criteria for psychiatric inpatient admission  Disposition: No evidence of imminent risk to self or others at present.   Patient does not meet criteria for psychiatric inpatient admission. Supportive therapy provided about ongoing stressors. Discussed crisis plan, support from social network, calling  911, coming to the Emergency Department, and calling Suicide Hotline.  Caroline Sauger, NP 03/22/2022 2:15 AM

## 2022-05-21 ENCOUNTER — Ambulatory Visit
Admission: RE | Admit: 2022-05-21 | Discharge: 2022-05-21 | Disposition: A | Discharge status: Home / Self Care | Payer: 59 | Source: Ambulatory Visit | Attending: Radiation Oncology | Admitting: Radiation Oncology

## 2022-05-21 VITALS — BP 154/82 | HR 109 | Temp 98.5°F | Ht 65.0 in | Wt 269.3 lb

## 2022-05-21 DIAGNOSIS — Z170 Estrogen receptor positive status [ER+]: Secondary | ICD-10-CM | POA: Insufficient documentation

## 2022-05-21 DIAGNOSIS — Z923 Personal history of irradiation: Secondary | ICD-10-CM | POA: Insufficient documentation

## 2022-05-21 DIAGNOSIS — Z79810 Long term (current) use of selective estrogen receptor modulators (SERMs): Secondary | ICD-10-CM | POA: Insufficient documentation

## 2022-05-21 DIAGNOSIS — D0511 Intraductal carcinoma in situ of right breast: Secondary | ICD-10-CM | POA: Insufficient documentation

## 2022-05-21 NOTE — Progress Notes (Signed)
Radiation Oncology Follow up Note  Name: Cindy Burke   Date:   05/21/2022 MRN:  [id number] DOB: 1966/06/06    This 56 y.o. 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  REFERRING PROVIDER: ILangston Reusing NP  HPI: Patient is a 56year old female now out 6 months having completed whole breast radiation to her right breast for ER positive ductal carcinoma in situ.  Seen today in routine follow-up she is doing well.  She specifically denies breast tenderness cough or bone pain..  She is currently on tamoxifen tolerating that well.  She was having abdominal pain back in May had an MRI of her brain showing no acute acute intracranial abnormality.  CT scan of her abdomen pelvis showed suspected lumbar impingement of L3-4 and L4-5.  She has not yet had a mammogram.  COMPLICATIONS OF TREATMENT: none  FOLLOW UP COMPLIANCE: keeps appointments   PHYSICAL EXAM:  LMP  (LMP Unknown)  Lungs are clear to A&P cardiac examination essentially unremarkable with regular rate and rhythm. No dominant mass or nodularity is noted in either breast in 2 positions examined. Incision is well-healed. No axillary or supraclavicular adenopathy is appreciated. Cosmetic result is excellent.  Well-developed well-nourished patient in NAD. HEENT reveals PERLA, EOMI, discs not visualized.  Oral cavity is clear. No oral mucosal lesions are identified. Neck is clear without evidence of cervical or supraclavicular adenopathy. Lungs are clear to A&P. Cardiac examination is essentially unremarkable with regular rate and rhythm without murmur rub or thrill. Abdomen is benign with no organomegaly or masses noted. Motor sensory and DTR levels are equal and symmetric in the upper and lower extremities. Cranial nerves II through XII are grossly intact. Proprioception is intact. No peripheral adenopathy or edema is  identified. No motor or sensory levels are noted. Crude visual fields are within normal range.  RADIOLOGY RESULTS: CT scan of abdomen and pelvis as well as MRI of brain reviewed compatible with above-stated findings  PLAN: Present time patient continues to do well 6 months out with no evidence of disease.  And pleased with her overall progress.  Of asked to see her back in 6 months for follow-up she will have a mammogram before the next visit.  She continues on tamoxifen without side effect.  Patient is to call with any concerns.  I would like to take this opportunity to thank you for allowing me to participate in the care of your patient..Noreene Filbert MD

## 2022-06-05 ENCOUNTER — Other Ambulatory Visit: Payer: Self-pay | Admitting: Nurse Practitioner

## 2022-06-05 DIAGNOSIS — D0511 Intraductal carcinoma in situ of right breast: Secondary | ICD-10-CM

## 2022-06-05 DIAGNOSIS — Z780 Asymptomatic menopausal state: Secondary | ICD-10-CM

## 2022-07-02 ENCOUNTER — Other Ambulatory Visit: Payer: Self-pay | Admitting: General Surgery

## 2022-07-02 DIAGNOSIS — D0511 Intraductal carcinoma in situ of right breast: Secondary | ICD-10-CM

## 2022-07-24 ENCOUNTER — Ambulatory Visit
Admission: RE | Admit: 2022-07-24 | Discharge: 2022-07-24 | Disposition: A | Discharge status: Home / Self Care | Payer: Medicaid Other | Source: Ambulatory Visit | Attending: General Surgery | Admitting: General Surgery

## 2022-07-24 DIAGNOSIS — Z9889 Other specified postprocedural states: Secondary | ICD-10-CM | POA: Insufficient documentation

## 2022-07-24 DIAGNOSIS — Z1231 Encounter for screening mammogram for malignant neoplasm of breast: Secondary | ICD-10-CM | POA: Diagnosis not present

## 2022-07-24 DIAGNOSIS — D0511 Intraductal carcinoma in situ of right breast: Secondary | ICD-10-CM | POA: Diagnosis present

## 2022-07-24 HISTORY — DX: Personal history of irradiation: Z92.3

## 2022-07-31 ENCOUNTER — Inpatient Hospital Stay: Payer: Medicaid Other | Admitting: Nurse Practitioner

## 2022-07-31 ENCOUNTER — Inpatient Hospital Stay: Payer: Managed Care, Other (non HMO) | Attending: Nurse Practitioner | Admitting: Nurse Practitioner

## 2022-07-31 ENCOUNTER — Encounter: Payer: Self-pay | Admitting: Nurse Practitioner

## 2022-07-31 ENCOUNTER — Other Ambulatory Visit: Payer: Self-pay

## 2022-07-31 VITALS — BP 141/72 | HR 82 | Temp 97.8°F | Resp 20 | Wt 258.2 lb

## 2022-07-31 DIAGNOSIS — F1210 Cannabis abuse, uncomplicated: Secondary | ICD-10-CM | POA: Diagnosis not present

## 2022-07-31 DIAGNOSIS — N951 Menopausal and female climacteric states: Secondary | ICD-10-CM | POA: Diagnosis not present

## 2022-07-31 DIAGNOSIS — Z923 Personal history of irradiation: Secondary | ICD-10-CM | POA: Insufficient documentation

## 2022-07-31 DIAGNOSIS — Z79899 Other long term (current) drug therapy: Secondary | ICD-10-CM | POA: Diagnosis not present

## 2022-07-31 DIAGNOSIS — Z87891 Personal history of nicotine dependence: Secondary | ICD-10-CM | POA: Insufficient documentation

## 2022-07-31 DIAGNOSIS — Z5181 Encounter for therapeutic drug level monitoring: Secondary | ICD-10-CM | POA: Diagnosis not present

## 2022-07-31 DIAGNOSIS — N644 Mastodynia: Secondary | ICD-10-CM | POA: Diagnosis not present

## 2022-07-31 DIAGNOSIS — M79604 Pain in right leg: Secondary | ICD-10-CM | POA: Diagnosis not present

## 2022-07-31 DIAGNOSIS — D0511 Intraductal carcinoma in situ of right breast: Secondary | ICD-10-CM

## 2022-07-31 DIAGNOSIS — Z79810 Long term (current) use of selective estrogen receptor modulators (SERMs): Secondary | ICD-10-CM | POA: Diagnosis not present

## 2022-07-31 DIAGNOSIS — R232 Flushing: Secondary | ICD-10-CM | POA: Diagnosis not present

## 2022-07-31 MED ORDER — VENLAFAXINE HCL 37.5 MG PO TABS
ORAL_TABLET | ORAL | 0 refills | Status: DC
  Filled 2022-07-31: qty 113, 60d supply, fill #0

## 2022-07-31 NOTE — Progress Notes (Signed)
[place]  Telephone:([phone] Fax:(336) [phone]  ID: Cindy Burke OB: 1966/01/04  MR#: [id number]  DQQ#:[id number]  Patient Care Team: Center, [facility] as PCP - General Rico Junker, RN as Registered Nurse Theodore Demark, RN (Inactive) as Registered Nurse Theodore Demark, RN (Inactive) as Oncology Nurse Navigator  CHIEF COMPLAINT: ER positive DCIS, right breast.  INTERVAL HISTORY: Patient returns to clinic today for routine 46-monthevaluation and to assess her toleration of tamoxifen.  She reports occasional hot flashes that do not affect her day-to-day activity, but otherwise is tolerating her treatments well.  She continues to have residual breast pain at the site of her lumpectomy.  She also is complaining of right leg pain particularly at night.  She otherwise feels well. She has no neurologic complaints.  She denies any recent fevers or illnesses.  She has a good appetite and denies weight loss.  She has no chest pain, shortness of breath, cough, or hemoptysis.  She denies any nausea, vomiting, constipation, or diarrhea.  She has no urinary complaints.  Patient offers no further specific complaints today.  REVIEW OF SYSTEMS:   Review of Systems  Constitutional:  Positive for malaise/fatigue. Negative for chills, fever and weight loss.       Hot flashes  HENT:  Negative for hearing loss, nosebleeds, sore throat and tinnitus.   Eyes:  Negative for blurred vision and double vision.  Respiratory:  Negative for cough, hemoptysis, shortness of breath and wheezing.   Cardiovascular:  Negative for chest pain, palpitations and leg swelling.  Gastrointestinal:  Negative for abdominal pain, blood in stool, constipation, diarrhea, melena, nausea and vomiting.  Genitourinary:  Negative for dysuria and urgency.  Musculoskeletal:  Positive for joint pain. Negative for back pain, falls and myalgias.  Skin:  Negative for itching and rash.   Neurological:  Negative for dizziness, tingling, sensory change, loss of consciousness, weakness and headaches.  Endo/Heme/Allergies:  Negative for environmental allergies. Does not bruise/bleed easily.  Psychiatric/Behavioral:  Negative for depression. The patient is not nervous/anxious and does not have insomnia.   As per HPI. Otherwise, a complete review of systems is negative.  PAST MEDICAL HISTORY: Past Medical History:  Diagnosis Date   [place]   right   Diabetes mellitus without complication (HCC)    Headache    migraines   Hypertension    Passed out    Personal history of radiation therapy     PAST SURGICAL HISTORY: Past Surgical History:  Procedure Laterality Date   BREAST BIOPSY Right 07/07/2021   stereo bx, ribbon clip,  DUCTAL CARCINOMA IN SITU,   BREAST BIOPSY Right 07/07/2021   Stereo bx,X clip,  DUCTAL CARCINOMA IN SITU   BREAST LUMPECTOMY WITH NEEDLE LOCALIZATION Right 08/04/2021   Procedure: BREAST LUMPECTOMY WITH NEEDLE LOCALIZATION;  Surgeon: Cindy Bellow MD;  Location: [facility] ORS;  Service: General;  Laterality: Right;    FAMILY HISTORY: Family History  Problem Relation Age of Onset   Hypertension Mother    Cancer Mother    Diabetes Mother    Hypertension Father    Heart attack Father    Diabetes Father    Stroke Brother    Sickle cell anemia Son    Breast cancer Neg Hx     ADVANCED DIRECTIVES (Y/N):  N  HEALTH MAINTENANCE: Social History   Tobacco Use   Smoking status: Former    Years: 10.00    Types: Cigarettes  Quit date: 05/2021    Years since quitting: 1.1   Smokeless tobacco: Never  Vaping Use   Vaping Use: Never used  Substance Use Topics   Alcohol use: Yes    Alcohol/week: 1.0 - 2.0 standard drink of alcohol    Types: 1 - 2 Shots of liquor per week   Drug use: Yes    Types: Marijuana    Comment: last use 08/21/21     Colonoscopy:  PAP:  Bone density:  Lipid panel:  Allergies  Allergen  Reactions   Sulfamethoxazole-Trimethoprim     Breaks out in welts    Current Outpatient Medications  Medication Sig Dispense Refill   amLODipine (NORVASC) 10 MG tablet Take 1 tablet (10 mg total) by mouth once daily. 30 tablet 1   atorvastatin (LIPITOR) 10 MG tablet Take 1 tablet (10 mg total) by mouth once daily. 30 tablet 1   chlorthalidone (HYGROTON) 25 MG tablet Take 1 tablet (25 mg total) by mouth once daily. 30 tablet 1   famotidine (PEPCID) 20 MG tablet Take 1 tablet (20 mg total) by mouth once daily. (Patient not taking: Reported on 12/05/2021) 30 tablet 1   lisinopril (ZESTRIL) 20 MG tablet TAKE ONE TABLET BY MOUTH ONCE DAILY AT BEDTIME 30 tablet 1   SILVADENE 1 % cream Apply 1 application topically to the affected area(s) 2 (two) times daily. (Patient not taking: Reported on 05/21/2022) 85 g 3   tamoxifen (NOLVADEX) 20 MG tablet Take 1 tablet (20 mg total) by mouth daily. 90 tablet 3   No current facility-administered medications for this visit.    OBJECTIVE: Vitals:   07/31/22 1405  BP: (!) 141/72  Pulse: 82  Resp: 20  Temp: 97.8 F (36.6 C)  SpO2: 100%     Body mass index is 42.97 kg/m.    ECOG FS:0 - Asymptomatic  General: Well-developed, well-nourished, no acute distress. Eyes: Pink conjunctiva, anicteric sclera. HEENT: Normocephalic, moist mucous membranes. Breast: Exam deferred today. Lungs: No audible wheezing or coughing. Heart: Regular rate and rhythm. Abdomen: Soft, nontender, no obvious distention. Musculoskeletal: No edema, cyanosis, or clubbing. Neuro: Alert, answering all questions appropriately. Cranial nerves grossly intact. Skin: No rashes or petechiae noted. Psych: Normal affect.  LAB RESULTS:  Lab Results  Component Value Date   NA 139 03/21/2022   K 3.1 (L) 03/21/2022   CL 104 03/21/2022   CO2 26 03/21/2022   GLUCOSE 118 (H) 03/21/2022   BUN 25 (H) 03/21/2022   CREATININE 1.01 (H) 03/21/2022   CALCIUM 9.4 03/21/2022   PROT 7.1  03/21/2022   ALBUMIN 3.6 03/21/2022   AST 17 03/21/2022   ALT 13 03/21/2022   ALKPHOS 60 03/21/2022   BILITOT 0.8 03/21/2022   GFRNONAA >60 03/21/2022   GFRAA 86 06/22/2020    Lab Results  Component Value Date   WBC 8.5 03/21/2022   NEUTROABS 4.7 09/20/2021   HGB 11.5 (L) 03/21/2022   HCT 37.8 03/21/2022   MCV 70.9 (L) 03/21/2022   PLT 234 03/21/2022     STUDIES: MM DIAG BREAST TOMO BILATERAL  Result Date: 07/24/2022 CLINICAL DATA:  56 year old female presenting for annual exam. History of right breast DCIS status post lumpectomy in 2022. EXAM: DIGITAL DIAGNOSTIC BILATERAL MAMMOGRAM WITH TOMOSYNTHESIS TECHNIQUE: Bilateral digital diagnostic mammography and breast tomosynthesis was performed. COMPARISON:  Previous exam(s). ACR Breast Density Category a: The breast tissue is almost entirely fatty. FINDINGS: Right breast: A spot 2D magnification view of the lumpectomy site was performed in addition  to standard views. There are expected postsurgical changes. No suspicious mass, distortion, or microcalcifications are identified to suggest presence of malignancy. Left breast: No suspicious mass, distortion, or microcalcifications are identified to suggest presence of malignancy. IMPRESSION: Benign postsurgical changes in the right breast. No mammographic evidence of malignancy bilaterally. RECOMMENDATION: Diagnostic bilateral mammogram in 1 year. I have discussed the findings and recommendations with the patient. If applicable, a reminder letter will be sent to the patient regarding the next appointment. BI-RADS CATEGORY  2: Benign. Electronically Signed   By: Cindy Burke M.D.   On: 07/24/2022 10:43   ASSESSMENT: ER positive DCIS, right breast.  PLAN:    ER positive DCIS, right breast: Lumpectomy completed on August 04, 2021 confirmed the above-stated diagnosis with no invasive component noted.  Because of that, patient did not require adjuvant chemotherapy.  She completed adjuvant XRT  in approximately January 2023.  Continue tamoxifen for a total of 5 years completing treatment in January 2028.  No further intervention is needed.  Return to clinic in 6 months for routine evaluation.  Mammograms are coordinated by Dr. Dwyane Burke office.  Her most recent mammogram was 07/24/2022 and was reported as benign postsurgical changes in the right breast and no mammographic evidence of malignancy bilaterally.  Plan to repeat in October 2024.  She saw him for follow up today with negative exam.  Hot flashes: Moderate. More bothersome at night. Start venlafaxine 37.5 mg daily x 1 week then increase to 37.5 mg twice a day if tolerating well. Avoid hormonal agents given dcis.  If symptoms do not improve with the venlafaxine could consider tamoxifen holiday given low-grade DCIS. Breast pain: Likely postsurgical.  Have instructed patient to follow-up with her surgeon if symptoms persist.   Leg pain: Unclear etiology and unrelated to her DCIS.  Possibly underlying arthritis.  Recommended patient will use Tylenol or ibuprofen sparingly.   Bone health-she has baseline bone density exam scheduled for later this month.  Disposition:  2 mo- virtual or telephone visit with me for hot flash follow up 6 mo- Dr Cindy Burke or me for DCIS/Tamoxifen follow up- la  Patient expressed understanding and was in agreement with this plan. She also understands that She can call clinic at any time with any questions, concerns, or complaints.    Cancer Staging  Ductal carcinoma in situ (DCIS) of right breast Staging form: Breast, AJCC 8th Edition - Clinical stage from 07/15/2021: Stage 0 (cTis (DCIS), cN0, cM0, ER+, PR+, HER2-) - Signed by Cindy Huger, MD on 07/15/2021 Stage prefix: Initial diagnosis Nuclear grade: Cindy Speck, NP   07/31/2022

## 2022-08-09 ENCOUNTER — Ambulatory Visit
Admission: RE | Admit: 2022-08-09 | Discharge: 2022-08-09 | Disposition: A | Discharge status: Home / Self Care | Payer: Medicaid Other | Source: Ambulatory Visit | Attending: Nurse Practitioner | Admitting: Nurse Practitioner

## 2022-08-09 DIAGNOSIS — D0511 Intraductal carcinoma in situ of right breast: Secondary | ICD-10-CM | POA: Insufficient documentation

## 2022-08-09 DIAGNOSIS — Z780 Asymptomatic menopausal state: Secondary | ICD-10-CM | POA: Diagnosis present

## 2022-08-09 DIAGNOSIS — Z13820 Encounter for screening for osteoporosis: Secondary | ICD-10-CM | POA: Diagnosis not present

## 2022-08-17 ENCOUNTER — Other Ambulatory Visit: Payer: Self-pay

## 2022-10-01 ENCOUNTER — Inpatient Hospital Stay: Payer: Managed Care, Other (non HMO) | Attending: Nurse Practitioner | Admitting: Nurse Practitioner

## 2022-10-01 ENCOUNTER — Telehealth: Payer: Medicaid Other | Admitting: Nurse Practitioner

## 2022-10-01 DIAGNOSIS — D0511 Intraductal carcinoma in situ of right breast: Secondary | ICD-10-CM

## 2022-10-01 DIAGNOSIS — N644 Mastodynia: Secondary | ICD-10-CM

## 2022-10-01 DIAGNOSIS — Z79810 Long term (current) use of selective estrogen receptor modulators (SERMs): Secondary | ICD-10-CM

## 2022-10-01 DIAGNOSIS — N951 Menopausal and female climacteric states: Secondary | ICD-10-CM

## 2022-10-01 DIAGNOSIS — R55 Syncope and collapse: Secondary | ICD-10-CM

## 2022-10-01 DIAGNOSIS — R232 Flushing: Secondary | ICD-10-CM | POA: Diagnosis not present

## 2022-10-01 DIAGNOSIS — M79606 Pain in leg, unspecified: Secondary | ICD-10-CM

## 2022-10-01 MED ORDER — VENLAFAXINE HCL 37.5 MG PO TABS: ORAL_TABLET | ORAL | 0 refills | Status: DC

## 2022-10-01 NOTE — Progress Notes (Signed)
[place]  [phone] Fax:([phone]  Virtual Visit Progress Note  I connected with Cindy Burke on 10/01/22 at  2:30 PM EST by telephone visit and verified that I am speaking with the correct person using two identifiers.   I discussed the limitations, risks, security and privacy concerns of performing an evaluation and management service by telemedicine and the availability of in-person appointments. I also discussed with the patient that there may be a patient responsible charge related to this service. The patient expressed understanding and agreed to proceed.   Other persons participating in the visit and their role in the encounter: none   Patient's location: home  Provider's location: clinic   Chief Complaint: hot flashes  ID: Cindy Burke OB: 11/20/1965  MR#: [id number]  [id number]#:[id number]  Patient Care Team: Center, [facility] as PCP - General Rico Junker, RN as Registered Nurse Theodore Demark, RN (Inactive) as Registered Nurse Theodore Demark, RN (Inactive) as Oncology Nurse Navigator  CHIEF COMPLAINT: ER positive DCIS, right breast.  INTERVAL HISTORY: Patient agrees to follow up via telephone. She previously reported night sweats and hot flashes. Was started on venlafaxine but never picked it up from pharmacy. Would like to try it though as symptoms are ongoing and bothersome. She complains of loss of consciousness with fall at home. Unsure of inciting event. No injury. Reportedly vomited. Declined evaluation at hospital. She has not seen anyone for this but is scheduled to see pcp later this week.   REVIEW OF SYSTEMS:   Review of Systems  Constitutional:  Positive for malaise/fatigue. Negative for chills, fever and weight loss.       Hot flashes  HENT:  Negative for hearing loss, nosebleeds, sore throat and tinnitus.   Eyes:  Negative for blurred vision and double vision.  Respiratory:  Negative for cough,  hemoptysis, shortness of breath and wheezing.   Cardiovascular:  Negative for chest pain, palpitations and leg swelling.  Gastrointestinal:  Negative for abdominal pain, blood in stool, constipation, diarrhea, melena, nausea and vomiting.  Genitourinary:  Negative for dysuria and urgency.  Musculoskeletal:  Positive for falls and joint pain. Negative for back pain and myalgias.  Skin:  Negative for itching and rash.  Neurological:  Positive for loss of consciousness. Negative for dizziness, tingling, sensory change, weakness and headaches.  Endo/Heme/Allergies:  Negative for environmental allergies. Does not bruise/bleed easily.  Psychiatric/Behavioral:  Negative for depression. The patient is not nervous/anxious and does not have insomnia.   As per HPI. Otherwise, a complete review of systems is negative.  PAST MEDICAL HISTORY: Past Medical History:  Diagnosis Date   Breast cancer ([place]) 07/2021   right   Diabetes mellitus without complication (HCC)    Headache    migraines   Hypertension    Passed out    Personal history of radiation therapy     PAST SURGICAL HISTORY: Past Surgical History:  Procedure Laterality Date   BREAST BIOPSY Right 07/07/2021   stereo bx, ribbon clip,  DUCTAL CARCINOMA IN SITU,   BREAST BIOPSY Right 07/07/2021   Stereo bx,X clip,  DUCTAL CARCINOMA IN SITU   BREAST LUMPECTOMY WITH NEEDLE LOCALIZATION Right 08/04/2021   Procedure: BREAST LUMPECTOMY WITH NEEDLE LOCALIZATION;  Surgeon: Robert Bellow, MD;  Location: [facility] ORS;  Service: General;  Laterality: Right;    FAMILY HISTORY: Family History  Problem Relation Age of Onset   Hypertension Mother    Cancer Mother    Diabetes  Mother    Hypertension Father    Heart attack Father    Diabetes Father    Stroke Brother    Sickle cell anemia Son    Breast cancer Neg Hx     ADVANCED DIRECTIVES (Y/N):  N  HEALTH MAINTENANCE: Social History   Tobacco Use   Smoking status: Former    Years:  10.00    Types: Cigarettes    Quit date: 05/2021    Years since quitting: 1.3   Smokeless tobacco: Never  Vaping Use   Vaping Use: Never used  Substance Use Topics   Alcohol use: Yes    Alcohol/week: 1.0 - 2.0 standard drink of alcohol    Types: 1 - 2 Shots of liquor per week   Drug use: Yes    Types: Marijuana    Comment: last use 08/21/21     Colonoscopy:  PAP:  Bone density:  Lipid panel:  Allergies  Allergen Reactions   Sulfamethoxazole-Trimethoprim     Breaks out in welts    Current Outpatient Medications  Medication Sig Dispense Refill   amLODipine (NORVASC) 10 MG tablet Take 1 tablet (10 mg total) by mouth once daily. 30 tablet 1   atorvastatin (LIPITOR) 10 MG tablet Take 1 tablet (10 mg total) by mouth once daily. 30 tablet 1   chlorthalidone (HYGROTON) 25 MG tablet Take 1 tablet (25 mg total) by mouth once daily. 30 tablet 1   famotidine (PEPCID) 20 MG tablet Take 1 tablet (20 mg total) by mouth once daily. 30 tablet 1   lisinopril (ZESTRIL) 20 MG tablet TAKE ONE TABLET BY MOUTH ONCE DAILY AT BEDTIME 30 tablet 1   metFORMIN (GLUCOPHAGE) 500 MG tablet      potassium chloride SA (KLOR-CON M) 20 MEQ tablet Take 20 mEq by mouth 2 (two) times daily.     SILVADENE 1 % cream Apply 1 application topically to the affected area(s) 2 (two) times daily. (Patient not taking: Reported on 05/21/2022) 85 g 3   tamoxifen (NOLVADEX) 20 MG tablet Take 1 tablet (20 mg total) by mouth daily. 90 tablet 3   venlafaxine (EFFEXOR) 37.5 MG tablet Take 1 tablet (37.5 mg total) by mouth daily for 7 days, THEN 1 tablet (37.5 mg total) in the morning and at bedtime. For hot flashes. 113 tablet 0   No current facility-administered medications for this visit.    OBJECTIVE: There were no vitals filed for this visit.    There is no height or weight on file to calculate BMI.    ECOG FS:0 - Asymptomatic  General: alert. Full sentences. No apparent distress. Oriented.   LAB RESULTS: Lab Results   Component Value Date   NA 139 03/21/2022   K 3.1 (L) 03/21/2022   CL 104 03/21/2022   CO2 26 03/21/2022   GLUCOSE 118 (H) 03/21/2022   BUN 25 (H) 03/21/2022   CREATININE 1.01 (H) 03/21/2022   CALCIUM 9.4 03/21/2022   PROT 7.1 03/21/2022   ALBUMIN 3.6 03/21/2022   AST 17 03/21/2022   ALT 13 03/21/2022   ALKPHOS 60 03/21/2022   BILITOT 0.8 03/21/2022   GFRNONAA >60 03/21/2022   GFRAA 86 06/22/2020    Lab Results  Component Value Date   WBC 8.5 03/21/2022   NEUTROABS 4.7 09/20/2021   HGB 11.5 (L) 03/21/2022   HCT 37.8 03/21/2022   MCV 70.9 (L) 03/21/2022   PLT 234 03/21/2022    STUDIES: No results found.  ASSESSMENT: ER positive DCIS, right breast.  PLAN:    ER positive DCIS, right breast: Lumpectomy completed on August 04, 2021 confirmed the above-stated diagnosis with no invasive component noted.  Because of that, patient did not require adjuvant chemotherapy.  She completed adjuvant XRT in approximately January 2023.  Continue tamoxifen for a total of 5 years completing treatment in January 2028.  No further intervention is needed.  Return to clinic in 6 months for routine evaluation.  Mammograms are coordinated by Dr. Byrnett's office.  Her most recent mammogram was 07/24/2022 and was reported as benign postsurgical changes in the right breast and no mammographic evidence of malignancy bilaterally.  Plan to repeat in October 2024.  She saw him for follow up today with negative exam.  Hot flashes: Moderate. More bothersome at night. She has not started venlafaxine so I will resend prescription. Start venlafaxine 37.5 mg daily x 1 week then increase to 37.5 mg twice a day if tolerating well. Avoid hormonal agents given dcis.  If symptoms do not improve with the venlafaxine could consider tamoxifen holiday given low-grade DCIS. Breast pain: Likely postsurgical.  Have instructed patient to follow-up with her surgeon if symptoms persist.   Leg pain: Unclear etiology and unrelated  to her DCIS.  Possibly underlying arthritis.  Recommended patient will use Tylenol or ibuprofen sparingly.   Bone health-she has baseline bone density exam scheduled for later this month. Syncopal event & fall- at home. Fell against bathtub. Did not seek medical attention. Recommend she see her pcp for evaluation. Has appt in 2 days. Consider cardiac evaluation  Disposition:  2 mo- virtual or telephone visit with me for hot flash follow up   I discussed the assessment and treatment plan with the patient. The patient was provided an opportunity to ask questions and all were answered. The patient agreed with the plan and demonstrated an understanding of the instructions.   The patient was advised to call back or seek an in-person evaluation if the symptoms worsen or if the condition fails to improve as anticipated.    I spent 15 minutes on this telephone encounter.    Cancer Staging  Ductal carcinoma in situ (DCIS) of right breast Staging form: Breast, AJCC 8th Edition - Clinical stage from 07/15/2021: Stage 0 (cTis (DCIS), cN0, cM0, ER+, PR+, HER2-) - Signed by Finnegan, Timothy J, MD on 07/15/2021 Stage prefix: Initial diagnosis Nuclear grade: G1   Lauren G Allen, NP   10/01/2022    

## 2022-10-02 ENCOUNTER — Emergency Department
Admission: EM | Admit: 2022-10-02 | Discharge: 2022-10-03 | Disposition: A | Discharge status: Home / Self Care | Payer: Managed Care, Other (non HMO) | Attending: Emergency Medicine | Admitting: Emergency Medicine

## 2022-10-02 ENCOUNTER — Emergency Department: Payer: Managed Care, Other (non HMO)

## 2022-10-02 ENCOUNTER — Encounter: Payer: Self-pay | Admitting: Emergency Medicine

## 2022-10-02 ENCOUNTER — Other Ambulatory Visit: Payer: Self-pay

## 2022-10-02 DIAGNOSIS — I10 Essential (primary) hypertension: Secondary | ICD-10-CM | POA: Insufficient documentation

## 2022-10-02 DIAGNOSIS — Z20822 Contact with and (suspected) exposure to covid-19: Secondary | ICD-10-CM | POA: Insufficient documentation

## 2022-10-02 DIAGNOSIS — E876 Hypokalemia: Secondary | ICD-10-CM | POA: Insufficient documentation

## 2022-10-02 DIAGNOSIS — E860 Dehydration: Secondary | ICD-10-CM | POA: Diagnosis not present

## 2022-10-02 DIAGNOSIS — R4781 Slurred speech: Secondary | ICD-10-CM | POA: Diagnosis not present

## 2022-10-02 DIAGNOSIS — R55 Syncope and collapse: Secondary | ICD-10-CM | POA: Diagnosis present

## 2022-10-02 LAB — COMPREHENSIVE METABOLIC PANEL
ALT: 16 U/L (ref 0–44)
AST: 21 U/L (ref 15–41)
Albumin: 4 g/dL (ref 3.5–5.0)
Alkaline Phosphatase: 68 U/L (ref 38–126)
Anion gap: 11 (ref 5–15)
BUN: 27 mg/dL — ABNORMAL HIGH (ref 6–20)
CO2: 24 mmol/L (ref 22–32)
Calcium: 9.3 mg/dL (ref 8.9–10.3)
Chloride: 104 mmol/L (ref 98–111)
Creatinine, Ser: 1.14 mg/dL — ABNORMAL HIGH (ref 0.44–1.00)
GFR, Estimated: 56 mL/min — ABNORMAL LOW (ref >60)
Glucose, Bld: 134 mg/dL — ABNORMAL HIGH (ref 70–99)
Potassium: 2.9 mmol/L — ABNORMAL LOW (ref 3.5–5.1)
Sodium: 139 mmol/L (ref 135–145)
Total Bilirubin: 0.5 mg/dL (ref 0.3–1.2)
Total Protein: 7.8 g/dL (ref 6.5–8.1)

## 2022-10-02 LAB — URINALYSIS, ROUTINE W REFLEX MICROSCOPIC
Bilirubin Urine: NEGATIVE
Glucose, UA: NEGATIVE mg/dL
Hgb urine dipstick: NEGATIVE
Ketones, ur: NEGATIVE mg/dL
Leukocytes,Ua: NEGATIVE
Nitrite: NEGATIVE
Protein, ur: NEGATIVE mg/dL
Specific Gravity, Urine: 1.017 (ref 1.005–1.030)
pH: 5 (ref 5.0–8.0)

## 2022-10-02 LAB — CBC WITH DIFFERENTIAL/PLATELET
Abs Immature Granulocytes: 0.06 10*3/uL (ref 0.00–0.07)
Basophils Absolute: 0.1 10*3/uL (ref 0.0–0.1)
Basophils Relative: 1 %
Eosinophils Absolute: 0.3 10*3/uL (ref 0.0–0.5)
Eosinophils Relative: 3 %
HCT: 37.2 % (ref 36.0–46.0)
Hemoglobin: 11.7 g/dL — ABNORMAL LOW (ref 12.0–15.0)
Immature Granulocytes: 1 %
Lymphocytes Relative: 30 %
Lymphs Abs: 3 10*3/uL (ref 0.7–4.0)
MCH: 21.9 pg — ABNORMAL LOW (ref 26.0–34.0)
MCHC: 31.5 g/dL (ref 30.0–36.0)
MCV: 69.7 fL — ABNORMAL LOW (ref 80.0–100.0)
Monocytes Absolute: 0.7 10*3/uL (ref 0.1–1.0)
Monocytes Relative: 7 %
Neutro Abs: 5.9 10*3/uL (ref 1.7–7.7)
Neutrophils Relative %: 58 %
Platelets: 322 10*3/uL (ref 150–400)
RBC: 5.34 MIL/uL — ABNORMAL HIGH (ref 3.87–5.11)
RDW: 14.6 % (ref 11.5–15.5)
WBC: 10.1 10*3/uL (ref 4.0–10.5)
nRBC: 0 % (ref 0.0–0.2)

## 2022-10-02 LAB — RESP PANEL BY RT-PCR (RSV, FLU A&B, COVID)  RVPGX2
Influenza A by PCR: NEGATIVE
Influenza B by PCR: NEGATIVE
Resp Syncytial Virus by PCR: NEGATIVE
SARS Coronavirus 2 by RT PCR: NEGATIVE

## 2022-10-02 LAB — CBG MONITORING, ED: Glucose-Capillary: 152 mg/dL — ABNORMAL HIGH (ref 70–99)

## 2022-10-02 LAB — TROPONIN I (HIGH SENSITIVITY)
Troponin I (High Sensitivity): 3 ng/L (ref <18)
Troponin I (High Sensitivity): 4 ng/L (ref <18)

## 2022-10-02 MED ORDER — LACTATED RINGERS IV BOLUS: 1000.0000 mL | Freq: Once | INTRAVENOUS | Status: DC

## 2022-10-02 MED ORDER — SODIUM CHLORIDE 0.9 % IV BOLUS
1000.0000 mL | Freq: Once | INTRAVENOUS | Status: AC
  Administered 2022-10-02: 1000 mL via INTRAVENOUS

## 2022-10-02 MED ORDER — KETOROLAC TROMETHAMINE 30 MG/ML IJ SOLN
30.0000 mg | Freq: Once | INTRAMUSCULAR | Status: AC
  Administered 2022-10-02: 30 mg via INTRAVENOUS
  Filled 2022-10-02: qty 1

## 2022-10-02 MED ORDER — POTASSIUM CHLORIDE 10 MEQ/100ML IV SOLN
10.0000 meq | Freq: Once | INTRAVENOUS | Status: AC
  Administered 2022-10-02: 10 meq via INTRAVENOUS
  Filled 2022-10-02: qty 100

## 2022-10-02 MED ORDER — POTASSIUM CHLORIDE 20 MEQ PO PACK
40.0000 meq | PACK | Freq: Every day | ORAL | Status: DC
  Administered 2022-10-02: 40 meq via ORAL
  Filled 2022-10-02: qty 2

## 2022-10-02 NOTE — ED Provider Triage Note (Signed)
Emergency Medicine Provider Triage Evaluation Note  Cindy Burke , a 56 y.o. female  was evaluated in triage.  Patient has a history of hypertension and diabetes.  Pt complains of dizziness that is constant but provoked with movement.  She denies headache, chest pain or chest tightness.  She has had some nausea and vomiting but no abdominal pain.  She reports that she has been unable to monitor her glucose levels at home.  Review of Systems  Positive: Patient has dizziness.  Negative: No chest pain or abdominal pain.   Physical Exam  BP (!) 156/80   Pulse (!) 104   Temp 98.6 F (37 C) (Oral)   LMP  (LMP Unknown)   SpO2 100%  Gen:   Awake, no distress   Resp:  Normal effort  MSK:   Moves extremities without difficulty  Other:    Medical Decision Making  Medically screening exam initiated at 4:41 PM.  Appropriate orders placed.  [name] was informed that the remainder of the evaluation will be completed by another provider, this initial triage assessment does not replace that evaluation, and the importance of remaining in the ED until their evaluation is complete.     Vallarie Mare [place], [place] 10/02/22 1642

## 2022-10-02 NOTE — ED Triage Notes (Signed)
Pt via POV from home. Pt c/o nausea, dizziness, and near syncope today. States that he feels like she has to pass out. Son states that this happened and pt completely loss consciousness a couple of days ago. Pt has hx of DM and states she has not checked her sugar. CBG 154.

## 2022-10-02 NOTE — ED Provider Notes (Signed)
[facility] Provider Note   Event Date/Time   First MD Initiated Contact with Patient 10/02/22 1713     (approximate) History  Dizziness and Near Syncope  HPI Cindy Burke is a 56 y.o. female with a past medical history of hypertension, prediabetes, depression/anxiety and recurrent episodes of syncope who presents for 2 episodes of witnessed syncope that began today.  Patient's son is at bedside who provides a lot of this history stating that he noted 2 episodes where patient was difficult to arouse and did not respond as she normally does.  Patient states that she feels generally weak, lightheaded, and dehydrated.  Patient denies any other complaints or symptoms at this time.  Patient's son at bedside also states that she has had mildly slurred speech intermittently throughout the day as well ROS: Patient currently denies any vision changes, tinnitus, facial droop, sore throat, chest pain, shortness of breath, abdominal pain, nausea/vomiting/diarrhea, dysuria, or weakness/numbness/paresthesias in any extremity   Physical Exam  Triage Vital Signs: ED Triage Vitals  Enc Vitals Group     BP 10/02/22 1640 (!) 156/80     Pulse Rate 10/02/22 1640 (!) 104     Resp 10/02/22 1646 18     Temp 10/02/22 1640 98.6 F (37 C)     Temp Source 10/02/22 1640 Oral     SpO2 10/02/22 1640 100 %     Weight 10/02/22 1641 240 lb (108.9 kg)     Height 10/02/22 1641 '5\' 6"'$  (1.676 m)     Head Circumference --      Peak Flow --      Pain Score 10/02/22 1640 0     Pain Loc --      Pain Edu? --      Excl. in [place]? --    Most recent vital signs: Vitals:   10/02/22 2007 10/02/22 2308  BP: 135/75 133/68  Pulse: 83 80  Resp: 18 18  Temp: 98.5 F (36.9 C) 98.3 F (36.8 C)  SpO2: 100% 100%   General: Awake, oriented x4. CV:  Good peripheral perfusion.  Resp:  Normal effort.  Abd:  No distention.  Other:  Middle-aged obese African-American female laying in bed in no acute  distress.  NIHSS is 0 ED Results / Procedures / Treatments  Labs (all labs ordered are listed, but only abnormal results are displayed) Labs Reviewed  COMPREHENSIVE METABOLIC PANEL - Abnormal; Notable for the following components:      Result Value   Potassium 2.9 (*)    Glucose, Bld 134 (*)    BUN 27 (*)    Creatinine, Ser 1.14 (*)    GFR, Estimated 56 (*)    All other components within normal limits  CBC WITH DIFFERENTIAL/PLATELET - Abnormal; Notable for the following components:   RBC 5.34 (*)    Hemoglobin 11.7 (*)    MCV 69.7 (*)    MCH 21.9 (*)    All other components within normal limits  URINALYSIS, ROUTINE W REFLEX MICROSCOPIC - Abnormal; Notable for the following components:   Color, Urine YELLOW (*)    APPearance CLEAR (*)    All other components within normal limits  CBG MONITORING, ED - Abnormal; Notable for the following components:   Glucose-Capillary 152 (*)    All other components within normal limits  RESP PANEL BY RT-PCR (RSV, FLU A&B, COVID)  [id number]  RESPIRATORY PANEL BY PCR  TROPONIN I (HIGH SENSITIVITY)  TROPONIN I (HIGH SENSITIVITY)  EKG ED ECG REPORT I, Naaman Plummer, the attending physician, personally viewed and interpreted this ECG. Date: 10/02/2022 EKG Time: 1627 Rate: 93 Rhythm: normal sinus rhythm QRS Axis: normal Intervals: normal ST/T Wave abnormalities: normal Narrative Interpretation: no evidence of acute ischemia RADIOLOGY ED MD interpretation: CT of the head without contrast interpreted by me shows no evidence of acute abnormalities including no intracerebral hemorrhage, obvious masses, or significant edema  MRI of the brain without contrast shows no acute intracranial abnormalities with only multifocal hyperintense T2 weighted signal within the white matter which is nonspecific but most commonly seen in the setting of chronic small vessel ischemia -Agree with radiology assessment Official radiology report(s): MR Brain Wo Contrast  (neuro protocol)  Result Date: 10/02/2022 CLINICAL DATA:  Altered mental status EXAM: MRI HEAD WITHOUT CONTRAST TECHNIQUE: Multiplanar, multiecho pulse sequences of the brain and surrounding structures were obtained without intravenous contrast. COMPARISON:  None Available. FINDINGS: Brain: No acute infarct, mass effect or extra-axial collection. No acute or chronic hemorrhage. There is multifocal hyperintense T2-weighted signal within the white matter. Parenchymal volume and CSF spaces are normal. The midline structures are normal. Vascular: Major flow voids are preserved. Skull and upper cervical spine: Normal calvarium and skull base. Visualized upper cervical spine and soft tissues are normal. Sinuses/Orbits:No paranasal sinus fluid levels or advanced mucosal thickening. No mastoid or middle ear effusion. Normal orbits. IMPRESSION: 1. No acute intracranial abnormality. 2. Multifocal hyperintense T2-weighted signal within the white matter, nonspecific but most commonly seen in the setting of chronic small vessel ischemia. Electronically Signed   By: Ulyses Jarred M.D.   On: 10/02/2022 23:53   CT Head Wo Contrast  Result Date: 10/02/2022 CLINICAL DATA:  Near syncope, dizziness, nausea EXAM: CT HEAD WITHOUT CONTRAST TECHNIQUE: Contiguous axial images were obtained from the base of the skull through the vertex without intravenous contrast. RADIATION DOSE REDUCTION: This exam was performed according to the departmental dose-optimization program which includes automated exposure control, adjustment of the mA and/or kV according to patient size and/or use of iterative reconstruction technique. COMPARISON:  03/21/2022 FINDINGS: Brain: No acute infarct or hemorrhage. Lateral ventricles and midline structures are unremarkable. No acute extra-axial fluid collections. No mass effect. Vascular: No hyperdense vessel or unexpected calcification. Skull: Normal. Negative for fracture or focal lesion. Sinuses/Orbits: No  acute finding. Other: None. IMPRESSION: 1. Stable head CT, no acute intracranial process. Electronically Signed   By: Randa Ngo M.D.   On: 10/02/2022 21:34   PROCEDURES: Critical Care performed: No Procedures MEDICATIONS ORDERED IN ED: Medications  potassium chloride (KLOR-CON) packet 40 mEq (40 mEq Oral Given 10/02/22 1849)  potassium chloride 10 mEq in 100 mL IVPB (0 mEq Intravenous Stopped 10/02/22 1950)  sodium chloride 0.9 % bolus 1,000 mL (0 mLs Intravenous Stopped 10/02/22 1950)  ketorolac (TORADOL) 30 MG/ML injection 30 mg (30 mg Intravenous Given 10/02/22 1849)  sodium chloride 0.9 % bolus 1,000 mL (0 mLs Intravenous Stopped 10/02/22 2114)   IMPRESSION / MDM / ASSESSMENT AND PLAN / ED COURSE  I reviewed the triage vital signs and the nursing notes.                              Patient's presentation is most consistent with acute presentation with potential threat to life or bodily function. Patient presents with complaints of syncope/presyncope ED Workup:  CBC, BMP, Troponin, BNP, ECG, CXR Differential diagnosis includes HF, ICH, seizure, stroke, HOCM, ACS, aortic  dissection, malignant arrhythmia, or GI bleed. Findings: No evidence of acute laboratory abnormalities.  Troponin negative x1 EKG: No e/o STEMI. No evidence of Brugadas sign, delta wave, epsilon wave, significantly prolonged QTc, or malignant arrhythmia.  Disposition: Discharge. Patient is at baseline at this time. Return precautions expressed and understood in person. Advised follow up with primary care provider or clinic physician in next 24 hours.   FINAL CLINICAL IMPRESSION(S) / ED DIAGNOSES   Final diagnoses:  Syncope and collapse  Dehydration  Hypokalemia   Rx / DC Orders   ED Discharge Orders     None      Note:  This document was prepared using Dragon voice recognition software and may include unintentional dictation errors.   Naaman Plummer, MD 10/03/22 [phone]

## 2022-10-02 NOTE — ED Notes (Signed)
Pt to radiology at this time.

## 2022-10-02 NOTE — ED Notes (Signed)
Pt back from MRI attached the monitor, VS obtained.

## 2022-10-02 NOTE — ED Notes (Addendum)
Attempted to obtain VS - pt in MRI at this time.

## 2022-10-03 LAB — RESPIRATORY PANEL BY PCR

## 2022-10-24 ENCOUNTER — Encounter: Payer: Self-pay | Admitting: Neurosurgery

## 2022-10-25 ENCOUNTER — Other Ambulatory Visit: Payer: Self-pay | Admitting: Family

## 2022-10-25 DIAGNOSIS — R55 Syncope and collapse: Secondary | ICD-10-CM

## 2022-10-29 ENCOUNTER — Encounter: Payer: Self-pay | Admitting: Radiation Oncology

## 2022-10-29 ENCOUNTER — Ambulatory Visit
Admission: RE | Admit: 2022-10-29 | Discharge: 2022-10-29 | Disposition: A | Discharge status: Home / Self Care | Payer: Medicaid Other | Source: Ambulatory Visit | Attending: Radiation Oncology | Admitting: Radiation Oncology

## 2022-10-29 VITALS — BP 137/81 | HR 79 | Temp 97.7°F | Resp 18 | Ht 66.0 in | Wt 261.8 lb

## 2022-10-29 DIAGNOSIS — Z79810 Long term (current) use of selective estrogen receptor modulators (SERMs): Secondary | ICD-10-CM | POA: Insufficient documentation

## 2022-10-29 DIAGNOSIS — Z170 Estrogen receptor positive status [ER+]: Secondary | ICD-10-CM | POA: Diagnosis not present

## 2022-10-29 DIAGNOSIS — D0511 Intraductal carcinoma in situ of right breast: Secondary | ICD-10-CM | POA: Diagnosis not present

## 2022-10-29 DIAGNOSIS — Z923 Personal history of irradiation: Secondary | ICD-10-CM | POA: Diagnosis not present

## 2022-10-29 NOTE — Progress Notes (Signed)
Radiation Oncology Follow up Note  Name: Cindy Burke   Date:   10/29/2022 MRN:  [id number] DOB: 02-05-1966    This 57 y.o. 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  Seen today in routine follow-up she is doing well.  She specifically denies breast tenderness cough or bone pain..  She had mammograms back in October which I have reviewed were BI-RADS 2 benign.  She also had MRI of her brain performed recently for some ental status changes showing no acute intracranial abnormality.  She is currently on tamoxifen tolerating it well without side effect.  COMPLICATIONS OF TREATMENT: none  FOLLOW UP COMPLIANCE: keeps appointments   PHYSICAL EXAM:  BP 137/81   Pulse 79   Temp 97.7 F (36.5 C)   Resp 18   Ht '5\' 6"'$  (1.676 m)   Wt 261 lb 12.8 oz (118.8 kg)   LMP  (LMP Unknown)   BMI 42.26 kg/m  Patient has large pendulous breasts.  No dominant masses noted in either breast.  No axillary or supraclavicular adenopathy is appreciated.  Well-developed well-nourished patient in NAD. HEENT reveals PERLA, EOMI, discs not visualized.  Oral cavity is clear. No oral mucosal lesions are identified. Neck is clear without evidence of cervical or supraclavicular adenopathy. Lungs are clear to A&P. Cardiac examination is essentially unremarkable with regular rate and rhythm without murmur rub or thrill. Abdomen is benign with no organomegaly or masses noted. Motor sensory and DTR levels are equal and symmetric in the upper and lower extremities. Cranial nerves II through XII are grossly intact. Proprioception is intact. No peripheral adenopathy or edema is identified. No motor or  sensory levels are noted. Crude visual fields are within normal range.  RADIOLOGY RESULTS: Mammograms reviewed compatible with above-stated findings  PLAN: The present time patient is doing well with no evidence of disease now out over a year.  I am pleased with her overall progress.  I asked to see her back in 1 year for follow-up.  She continues on tamoxifen without side effect.  Patient is to call with any concerns.  I would like to take this opportunity to thank you for allowing me to participate in the care of your patient.Noreene Filbert, MD

## 2022-10-30 ENCOUNTER — Ambulatory Visit: Payer: Medicaid Other | Attending: Family

## 2022-10-31 ENCOUNTER — Ambulatory Visit: Admission: RE | Admit: 2022-10-31 | Payer: Medicaid Other | Source: Ambulatory Visit

## 2022-11-20 ENCOUNTER — Ambulatory Visit: Payer: Medicaid Other | Admitting: Radiation Oncology

## 2022-12-03 ENCOUNTER — Inpatient Hospital Stay: Payer: Medicaid Other | Attending: Nurse Practitioner | Admitting: Nurse Practitioner

## 2022-12-03 DIAGNOSIS — N951 Menopausal and female climacteric states: Secondary | ICD-10-CM

## 2022-12-03 NOTE — Progress Notes (Signed)
Patient did not answer for virtual visit. Unable to leave vm. No mychart set up.

## 2022-12-12 ENCOUNTER — Other Ambulatory Visit: Payer: Self-pay | Admitting: Nurse Practitioner

## 2022-12-19 ENCOUNTER — Emergency Department: Payer: Medicaid Other

## 2022-12-19 ENCOUNTER — Other Ambulatory Visit: Payer: Self-pay

## 2022-12-19 ENCOUNTER — Emergency Department
Admission: EM | Admit: 2022-12-19 | Discharge: 2022-12-19 | Disposition: A | Discharge status: Home / Self Care | Payer: Medicaid Other | Attending: Emergency Medicine | Admitting: Emergency Medicine

## 2022-12-19 DIAGNOSIS — Z79899 Other long term (current) drug therapy: Secondary | ICD-10-CM | POA: Diagnosis not present

## 2022-12-19 DIAGNOSIS — G43909 Migraine, unspecified, not intractable, without status migrainosus: Secondary | ICD-10-CM | POA: Diagnosis not present

## 2022-12-19 DIAGNOSIS — E119 Type 2 diabetes mellitus without complications: Secondary | ICD-10-CM | POA: Insufficient documentation

## 2022-12-19 DIAGNOSIS — I10 Essential (primary) hypertension: Secondary | ICD-10-CM | POA: Diagnosis not present

## 2022-12-19 DIAGNOSIS — R42 Dizziness and giddiness: Secondary | ICD-10-CM

## 2022-12-19 LAB — CBC WITH DIFFERENTIAL/PLATELET
Abs Immature Granulocytes: 0.04 10*3/uL (ref 0.00–0.07)
Basophils Absolute: 0 10*3/uL (ref 0.0–0.1)
Basophils Relative: 0 %
Eosinophils Absolute: 0.4 10*3/uL (ref 0.0–0.5)
Eosinophils Relative: 5 %
HCT: 36 % (ref 36.0–46.0)
Hemoglobin: 11.1 g/dL — ABNORMAL LOW (ref 12.0–15.0)
Immature Granulocytes: 1 %
Lymphocytes Relative: 30 %
Lymphs Abs: 2.3 10*3/uL (ref 0.7–4.0)
MCH: 21.7 pg — ABNORMAL LOW (ref 26.0–34.0)
MCHC: 30.8 g/dL (ref 30.0–36.0)
MCV: 70.5 fL — ABNORMAL LOW (ref 80.0–100.0)
Monocytes Absolute: 0.6 10*3/uL (ref 0.1–1.0)
Monocytes Relative: 7 %
Neutro Abs: 4.5 10*3/uL (ref 1.7–7.7)
Neutrophils Relative %: 57 %
Platelets: 227 10*3/uL (ref 150–400)
RBC: 5.11 MIL/uL (ref 3.87–5.11)
RDW: 14.8 % (ref 11.5–15.5)
WBC: 7.8 10*3/uL (ref 4.0–10.5)
nRBC: 0 % (ref 0.0–0.2)

## 2022-12-19 LAB — COMPREHENSIVE METABOLIC PANEL
ALT: 13 U/L (ref 0–44)
AST: 18 U/L (ref 15–41)
Albumin: 3.8 g/dL (ref 3.5–5.0)
Alkaline Phosphatase: 64 U/L (ref 38–126)
Anion gap: 9 (ref 5–15)
BUN: 23 mg/dL — ABNORMAL HIGH (ref 6–20)
CO2: 26 mmol/L (ref 22–32)
Calcium: 9 mg/dL (ref 8.9–10.3)
Chloride: 105 mmol/L (ref 98–111)
Creatinine, Ser: 0.91 mg/dL (ref 0.44–1.00)
GFR, Estimated: >60 mL/min (ref >60)
Glucose, Bld: 124 mg/dL — ABNORMAL HIGH (ref 70–99)
Potassium: 3 mmol/L — ABNORMAL LOW (ref 3.5–5.1)
Sodium: 140 mmol/L (ref 135–145)
Total Bilirubin: 0.5 mg/dL (ref 0.3–1.2)
Total Protein: 7.3 g/dL (ref 6.5–8.1)

## 2022-12-19 LAB — TROPONIN I (HIGH SENSITIVITY): Troponin I (High Sensitivity): 3 ng/L (ref <18)

## 2022-12-19 MED ORDER — PROCHLORPERAZINE MALEATE 10 MG PO TABS
10.0000 mg | ORAL_TABLET | Freq: Four times a day (QID) | ORAL | 0 refills | Status: AC | PRN
  Filled 2022-12-19: qty 12, 3d supply, fill #0

## 2022-12-19 MED ORDER — PROCHLORPERAZINE EDISYLATE 10 MG/2ML IJ SOLN
10.0000 mg | Freq: Once | INTRAMUSCULAR | Status: AC
  Administered 2022-12-19: 10 mg via INTRAVENOUS
  Filled 2022-12-19: qty 2

## 2022-12-19 MED ORDER — POTASSIUM CHLORIDE CRYS ER 20 MEQ PO TBCR
40.0000 meq | EXTENDED_RELEASE_TABLET | Freq: Once | ORAL | Status: AC
  Administered 2022-12-19: 40 meq via ORAL
  Filled 2022-12-19: qty 2

## 2022-12-19 MED ORDER — LACTATED RINGERS IV BOLUS
1000.0000 mL | Freq: Once | INTRAVENOUS | Status: AC
  Administered 2022-12-19: 1000 mL via INTRAVENOUS

## 2022-12-19 MED ORDER — DIPHENHYDRAMINE HCL 50 MG/ML IJ SOLN
25.0000 mg | Freq: Once | INTRAMUSCULAR | Status: AC
  Administered 2022-12-19: 25 mg via INTRAVENOUS
  Filled 2022-12-19: qty 1

## 2022-12-19 NOTE — ED Provider Notes (Signed)
[facility] Provider Note    Event Date/Time   First MD Initiated Contact with Patient 12/19/22 1725     (approximate)   History   Chief Complaint Dizziness   HPI  Cindy Burke is a 57 y.o. female with past medical history of hypertension, hyperlipidemia, and diabetes who presents to the ED complaining of dizziness.  Patient reports that she has been feeling lightheaded and dizzy with a headache for the past 2 weeks.  She states the headache has been present constantly, but seems to wax and wane in severity.  She describes it as a dull ache across both sides of her frontal scalp, does not seem to be exacerbated or alleviated by anything in particular.  She also reports constant lightheadedness, has felt like she is going to pass out at times, but states it is not currently that severe.  She denies any fevers or neck stiffness, has not had any vision changes, speech changes, numbness, or weakness.  Per EMS, patient was noted by son to have intermittent facial droop yesterday as well as concern for possible facial droop at her PCPs office earlier today.     Physical Exam   Triage Vital Signs: ED Triage Vitals  Enc Vitals Group     BP      Pulse      Resp      Temp      Temp src      SpO2      Weight      Height      Head Circumference      Peak Flow      Pain Score      Pain Loc      Pain Edu?      Excl. in [place]?     Most recent vital signs: Vitals:   12/19/22 1740 12/19/22 1800  BP:  (!) 144/80  Pulse: 85 87  Resp: 17 16  Temp: 98.8 F (37.1 C)   SpO2: 99% 100%    Constitutional: Alert and oriented. Eyes: Conjunctivae are normal. Head: Atraumatic. Nose: No congestion/rhinnorhea. Mouth/Throat: Mucous membranes are moist.  Cardiovascular: Normal rate, regular rhythm. Grossly normal heart sounds.  2+ radial pulses bilaterally. Respiratory: Normal respiratory effort.  No retractions. Lungs CTAB. Gastrointestinal: Soft and nontender. No  distention. Musculoskeletal: No lower extremity tenderness nor edema.  Neurologic:  Normal speech and language, no facial droop noted. No gross focal neurologic deficits are appreciated.    ED Results / Procedures / Treatments   Labs (all labs ordered are listed, but only abnormal results are displayed) Labs Reviewed  CBC WITH DIFFERENTIAL/PLATELET - Abnormal; Notable for the following components:      Result Value   Hemoglobin 11.1 (*)    MCV 70.5 (*)    MCH 21.7 (*)    All other components within normal limits  COMPREHENSIVE METABOLIC PANEL - Abnormal; Notable for the following components:   Potassium 3.0 (*)    Glucose, Bld 124 (*)    BUN 23 (*)    All other components within normal limits  URINALYSIS, ROUTINE W REFLEX MICROSCOPIC  TROPONIN I (HIGH SENSITIVITY)     EKG  ED ECG REPORT I, Blake Divine, the attending physician, personally viewed and interpreted this ECG.   Date: 12/19/2022  EKG Time: 17:34  Rate: 86  Rhythm: normal sinus rhythm  Axis: Normal  Intervals:none  ST&T Change: None  RADIOLOGY CT head reviewed and interpreted by me with no hemorrhage  or midline shift.  PROCEDURES:  Critical Care performed: No  Procedures   MEDICATIONS ORDERED IN ED: Medications  prochlorperazine (COMPAZINE) injection 10 mg (10 mg Intravenous Given 12/19/22 1746)  diphenhydrAMINE (BENADRYL) injection 25 mg (25 mg Intravenous Given 12/19/22 1746)  lactated ringers bolus 1,000 mL (1,000 mLs Intravenous New Bag/Given 12/19/22 1743)  potassium chloride SA (KLOR-CON M) CR tablet 40 mEq (40 mEq Oral Given 12/19/22 1844)     IMPRESSION / MDM / ASSESSMENT AND PLAN / ED COURSE  I reviewed the triage vital signs and the nursing notes.                              57 y.o. female with past medical history of hypertension, hyperlipidemia, and diabetes who presents to the ED complaining of 2 weeks of constant waxing and waning headache associated with dizziness and  lightheadedness, concern for facial droop reported by family and PCPs office.  Patient's presentation is most consistent with acute presentation with potential threat to life or bodily function.  Differential diagnosis includes, but is not limited to, stroke, SAH, TIA, migraine headache, tension headache, meningitis, anemia, electrolyte abnormality.  Patient nontoxic-appearing and in no acute distress, vital signs are unremarkable.  Patient has no focal neurologic deficits on my exam, no facial droop noted at this time.  She does report a history of migraines, but denies headaches this severe in the past.  No findings concerning for meningitis, will further assess with CT but also doubt SAH given headache present constantly for 2 weeks.  Labs are also pending at this time, will treat symptomatically with IV Compazine and Benadryl.  CT head is negative for acute process, low suspicion for stroke or TIA at this time.  Labs are also reassuring with no significant anemia, leukocytosis, or AKI.  Patient does have mild hypokalemia that was repleted.  Troponin within normal limits and I have low suspicion for cardiac etiology for her dizziness and lightheadedness.  Reviewing patient's chart, she has had similar presentations for dizziness and lightheadedness with unremarkable EEG and Holter monitor earlier this year.  On reassessment, she reports that headache is improving and she seems appropriate for discharge home with outpatient follow-up.  She was counseled to return to the ED for new or worsening symptoms, patient and family agree with plan.      FINAL CLINICAL IMPRESSION(S) / ED DIAGNOSES   Final diagnoses:  Migraine without status migrainosus, not intractable, unspecified migraine type  Lightheadedness     Rx / DC Orders   ED Discharge Orders          Ordered    prochlorperazine (COMPAZINE) 10 MG tablet  Every 6 hours PRN        12/19/22 1940             Note:  This document was  prepared using Dragon voice recognition software and may include unintentional dictation errors.   Blake Divine, MD 12/19/22 [phone]

## 2022-12-19 NOTE — ED Triage Notes (Signed)
Pt arrived to room 17 via EMS w c/o dizziness, lightheaded and weakness 2-3 days. Family reports pt had facial drooping yesterday. A&Ox4 on arrival.

## 2022-12-20 ENCOUNTER — Other Ambulatory Visit: Payer: Self-pay

## 2023-01-04 ENCOUNTER — Other Ambulatory Visit: Payer: Self-pay

## 2023-02-01 ENCOUNTER — Inpatient Hospital Stay: Payer: Medicaid Other | Attending: Nurse Practitioner | Admitting: Nurse Practitioner

## 2023-02-01 ENCOUNTER — Encounter: Payer: Self-pay | Admitting: Nurse Practitioner

## 2023-02-01 VITALS — BP 141/95 | HR 81 | Temp 97.3°F | Wt 275.0 lb

## 2023-02-01 DIAGNOSIS — N951 Menopausal and female climacteric states: Secondary | ICD-10-CM

## 2023-02-01 DIAGNOSIS — Z8249 Family history of ischemic heart disease and other diseases of the circulatory system: Secondary | ICD-10-CM | POA: Diagnosis not present

## 2023-02-01 DIAGNOSIS — M2550 Pain in unspecified joint: Secondary | ICD-10-CM | POA: Diagnosis not present

## 2023-02-01 DIAGNOSIS — R232 Flushing: Secondary | ICD-10-CM | POA: Diagnosis not present

## 2023-02-01 DIAGNOSIS — Z833 Family history of diabetes mellitus: Secondary | ICD-10-CM | POA: Insufficient documentation

## 2023-02-01 DIAGNOSIS — R61 Generalized hyperhidrosis: Secondary | ICD-10-CM | POA: Insufficient documentation

## 2023-02-01 DIAGNOSIS — Z5181 Encounter for therapeutic drug level monitoring: Secondary | ICD-10-CM

## 2023-02-01 DIAGNOSIS — D0511 Intraductal carcinoma in situ of right breast: Secondary | ICD-10-CM | POA: Diagnosis present

## 2023-02-01 DIAGNOSIS — Z79810 Long term (current) use of selective estrogen receptor modulators (SERMs): Secondary | ICD-10-CM | POA: Diagnosis not present

## 2023-02-01 DIAGNOSIS — Z923 Personal history of irradiation: Secondary | ICD-10-CM | POA: Diagnosis not present

## 2023-02-01 DIAGNOSIS — Z79899 Other long term (current) drug therapy: Secondary | ICD-10-CM | POA: Insufficient documentation

## 2023-02-01 DIAGNOSIS — Z87891 Personal history of nicotine dependence: Secondary | ICD-10-CM | POA: Insufficient documentation

## 2023-02-01 DIAGNOSIS — Z823 Family history of stroke: Secondary | ICD-10-CM | POA: Insufficient documentation

## 2023-02-01 MED ORDER — VENLAFAXINE HCL 37.5 MG PO TABS: ORAL_TABLET | ORAL | 0 refills | Status: DC

## 2023-02-01 NOTE — Progress Notes (Signed)
[facility]  Telephone:([phone] [phone]  Chief Complaint: hot flashes  ID: Cindy Burke OB: [birth year]  MR#: [id number]  AOZ#:[id number]  Patient Care Team: Center, [facility] as PCP - General [name], RN as Registered Nurse [name], RN (Inactive) as Registered Nurse [name], RN (Inactive) as Oncology Nurse Navigator  CHIEF COMPLAINT: ER positive DCIS, right breast.  INTERVAL HISTORY: Patient returns to clinic for follow up for hot flashes, on tamoxifen for DCIS. She continues to have hot flashes and night sweats. She has not ever picked up venlafaxine but would like to try medication. No more falls or dizziness. Feels well. Denies breast concerns. No unintentional weight loss. Denies other complaints. agrees to follow up via telephone. She previously reported night sweats and hot flashes. Was started o  REVIEW OF SYSTEMS:   Review of Systems  Constitutional:  Negative for chills, fever, malaise/fatigue and weight loss.       Hot flashes  HENT:  Negative for hearing loss, nosebleeds, sore throat and tinnitus.   Eyes:  Negative for blurred vision and double vision.  Respiratory:  Negative for cough, hemoptysis, shortness of breath and wheezing.   Cardiovascular:  Negative for chest pain, palpitations and leg swelling.  Gastrointestinal:  Negative for abdominal pain, blood in stool, constipation, diarrhea, melena, nausea and vomiting.  Genitourinary:  Negative for dysuria and urgency.  Musculoskeletal:  Positive for joint pain. Negative for back pain, falls and myalgias.  Skin:  Negative for itching and rash.  Neurological:  Negative for dizziness, tingling, sensory change, loss of consciousness, weakness and headaches.  Endo/Heme/Allergies:  Negative for environmental allergies. Does not bruise/bleed easily.  Psychiatric/Behavioral:  Negative for depression. The patient is not nervous/anxious and does not  have insomnia.   As per HPI. Otherwise, a complete review of systems is negative.  PAST MEDICAL HISTORY: Past Medical History:  Diagnosis Date   Breast cancer 07/2021   right   Diabetes mellitus without complication    Headache    migraines   Hypertension    Passed out    Personal history of radiation therapy     PAST SURGICAL HISTORY: Past Surgical History:  Procedure Laterality Date   BREAST BIOPSY Right 07/07/2021   stereo bx, ribbon clip,  DUCTAL CARCINOMA IN SITU,   BREAST BIOPSY Right 07/07/2021   Stereo bx,X clip,  DUCTAL CARCINOMA IN SITU   BREAST LUMPECTOMY WITH NEEDLE LOCALIZATION Right 08/04/2021   Procedure: BREAST LUMPECTOMY WITH NEEDLE LOCALIZATION;  Surgeon: Earline Mayotte, MD;  Location: [facility] ORS;  Service: General;  Laterality: Right;    FAMILY HISTORY: Family History  Problem Relation Age of Onset   Hypertension Mother    Cancer Mother    Diabetes Mother    Hypertension Father    Heart attack Father    Diabetes Father    Stroke Brother    Sickle cell anemia Son    Breast cancer Neg Hx     ADVANCED DIRECTIVES (Y/N):  N  HEALTH MAINTENANCE: Social History   Tobacco Use   Smoking status: Former    Years: 10    Types: Cigarettes    Quit date: 05/2021    Years since quitting: 1.6   Smokeless tobacco: Never  Vaping Use   Vaping Use: Never used  Substance Use Topics   Alcohol use: Yes    Alcohol/week: 1.0 - 2.0 standard drink of alcohol    Types: 1 - 2 Shots of liquor  per week   Drug use: Yes    Types: Marijuana    Comment: last use 08/21/21     Colonoscopy:  PAP:  Bone density:  Lipid panel:  Allergies  Allergen Reactions   Sulfamethoxazole-Trimethoprim     Breaks out in welts    Current Outpatient Medications  Medication Sig Dispense Refill   amLODipine (NORVASC) 10 MG tablet Take 1 tablet (10 mg total) by mouth once daily. 30 tablet 1   atorvastatin (LIPITOR) 10 MG tablet Take 1 tablet (10 mg total) by mouth once daily.  30 tablet 1   chlorthalidone (HYGROTON) 25 MG tablet Take 1 tablet (25 mg total) by mouth once daily. 30 tablet 1   metFORMIN (GLUCOPHAGE) 500 MG tablet      potassium chloride SA (KLOR-CON M) 20 MEQ tablet Take 20 mEq by mouth 2 (two) times daily.     prochlorperazine (COMPAZINE) 10 MG tablet Take 1 tablet (10 mg total) by mouth every 6 (six) hours as needed. 12 tablet 0   tamoxifen (NOLVADEX) 20 MG tablet Take 1 tablet (20 mg total) by mouth daily. 90 tablet 3   famotidine (PEPCID) 20 MG tablet Take 1 tablet (20 mg total) by mouth once daily. 30 tablet 1   lisinopril (ZESTRIL) 20 MG tablet TAKE ONE TABLET BY MOUTH ONCE DAILY AT BEDTIME 30 tablet 1   SILVADENE 1 % cream Apply 1 application topically to the affected area(s) 2 (two) times daily. (Patient not taking: Reported on 05/21/2022) 85 g 3   venlafaxine (EFFEXOR) 37.5 MG tablet Take 1 tablet (37.5 mg total) by mouth daily for 7 days, THEN 1 tablet (37.5 mg total) in the morning and at bedtime. For hot flashes. 113 tablet 0   No current facility-administered medications for this visit.    OBJECTIVE: Vitals:   02/01/23 1317  BP: (!) 141/95  Pulse: 81  Temp: (!) 97.3 F (36.3 C)      Body mass index is 40.61 kg/m.    ECOG FS:0 - Asymptomatic  General: Well-developed, well-nourished, no acute distress. Eyes: Pink conjunctiva, anicteric sclera. Lungs: Clear to auscultation bilaterally.  No audible wheezing or coughing Heart: Regular rate and rhythm.  Abdomen: Soft, nontender, nondistended.  Musculoskeletal: No edema, cyanosis, or clubbing. Neuro: Alert, answering all questions appropriately. Cranial nerves grossly intact. Skin: No rashes or petechiae noted. Psych: Normal affect.   LAB RESULTS: Lab Results  Component Value Date   NA 140 12/19/2022   K 3.0 (L) 12/19/2022   CL 105 12/19/2022   CO2 26 12/19/2022   GLUCOSE 124 (H) 12/19/2022   BUN 23 (H) 12/19/2022   CREATININE 0.91 12/19/2022   CALCIUM 9.0 12/19/2022   PROT  7.3 12/19/2022   ALBUMIN 3.8 12/19/2022   AST 18 12/19/2022   ALT 13 12/19/2022   ALKPHOS 64 12/19/2022   BILITOT 0.5 12/19/2022   GFRNONAA >60 12/19/2022   GFRAA 86 06/22/2020    Lab Results  Component Value Date   WBC 7.8 12/19/2022   NEUTROABS 4.5 12/19/2022   HGB 11.1 (L) 12/19/2022   HCT 36.0 12/19/2022   MCV 70.5 (L) 12/19/2022   PLT 227 12/19/2022    STUDIES: No results found.  ASSESSMENT: ER positive DCIS, right breast.  PLAN:    ER positive DCIS, right breast: Lumpectomy completed on August 04, 2021 confirmed the above-stated diagnosis with no invasive component noted.  Because of that, patient did not require adjuvant chemotherapy.  She completed adjuvant XRT in approximately January 2023.  Continue tamoxifen for a total of 5 years completing treatment in January 2028.  No further intervention is needed.  Return to clinic in 6 months for routine evaluation.  Mammograms are coordinated by Dr. Rutherford Nail office. Since his retirement we will order mammograms. Next in October 2024.  Her most recent mammogram was 07/24/2022 and was reported as benign postsurgical changes in the right breast and no mammographic evidence of malignancy bilaterally.  Plan to repeat in October 2024.   Hot flashes: Moderate. More bothersome at night. She has not started venlafaxine so I will resend prescription. Still says she never received. I'll print prescription for her to take to pharmacy to fill. Start venlafaxine 37.5 mg daily x 1 week then increase to 37.5 mg twice a day if tolerating well. Avoid hormonal agents given dcis.  If symptoms do not improve with the venlafaxine could consider tamoxifen holiday given low-grade DCIS. Breast pain: Likely postsurgical.  Resolved.  Leg pain: Unclear etiology and unrelated to her DCIS.  Possibly underlying arthritis.  Recommended patient will use Tylenol or ibuprofen sparingly.   Bone health-she has baseline bone density exam scheduled for later this month.  Dexa scan on 08/19/22 was normal with T score 0.7. Managed by pcp.  Syncopal event & fall- at home. Fell against bathtub. Did not seek medical attention. S/p work up with pcp.   Disposition:  2 mo- virtual or telephone visit with me for hot flashes f/u 4 mo- see Irving Copas for breast surveillance/tamoxifen- la   I discussed the assessment and treatment plan with the patient. The patient was provided an opportunity to ask questions and all were answered. The patient agreed with the plan and demonstrated an understanding of the instructions.   The patient was advised to call back or seek an in-person evaluation if the symptoms worsen or if the condition fails to improve as anticipated.     Cancer Staging  Ductal carcinoma in situ (DCIS) of right breast Staging form: Breast, AJCC 8th Edition - Clinical stage from 07/15/2021: Stage 0 (cTis (DCIS), cN0, cM0, ER+, PR+, HER2-) - Signed by Jeralyn Ruths, MD on 07/15/2021 Stage prefix: Initial diagnosis Nuclear grade: Tiana Loft, NP   02/01/2023

## 2023-03-08 ENCOUNTER — Other Ambulatory Visit: Payer: Self-pay | Admitting: Oncology

## 2023-04-04 ENCOUNTER — Inpatient Hospital Stay: Payer: Medicaid Other | Attending: Nurse Practitioner | Admitting: Nurse Practitioner

## 2023-04-04 DIAGNOSIS — N951 Menopausal and female climacteric states: Secondary | ICD-10-CM | POA: Diagnosis not present

## 2023-04-04 DIAGNOSIS — Z79810 Long term (current) use of selective estrogen receptor modulators (SERMs): Secondary | ICD-10-CM | POA: Diagnosis not present

## 2023-04-04 DIAGNOSIS — Z5181 Encounter for therapeutic drug level monitoring: Secondary | ICD-10-CM

## 2023-04-04 MED ORDER — GABAPENTIN 100 MG PO CAPS: ORAL_CAPSULE | ORAL | 0 refills | Status: DC

## 2023-04-04 NOTE — Progress Notes (Signed)
[name]  Telephone:([phone] [phone]   Virtual Visit Progress Note  I connected with Cindy Burke on 04/04/23 at  3:00 PM EDT by telephone visit and verified that I am speaking with the correct person using two identifiers.   I discussed the limitations, risks, security and privacy concerns of performing an evaluation and management service by telemedicine and the availability of in-person appointments. I also discussed with the patient that there may be a patient responsible charge related to this service. The patient expressed understanding and agreed to proceed.   Other persons participating in the visit and their role in the encounter: none   Patient's location: home  Provider's location: clinic   Chief Complaint: hot flashes  ID: [name] OB: Nov 23, 1965  MR#: [id number]  YQM#:[id number]  Patient Care Team: Center, [facility] as PCP - General [name], RN as Registered Nurse [name], [name], RN (Inactive) as Registered Nurse [name], RN (Inactive) as Oncology Nurse Navigator  CHIEF COMPLAINT: ER positive DCIS, right breast.  INTERVAL HISTORY: Patient returns to clinic for follow up for hot flashes, on tamoxifen for DCIS. She has started venlafaxine 37.5 mg twice a day. Hot flashes are much better but continues to have night sweats, wetting her clothing. She's tolerating effexor well otherwise and denies other complaints.   REVIEW OF SYSTEMS:   Review of Systems  Constitutional:  Negative for chills, fever, malaise/fatigue and weight loss.       Hot flashes  HENT:  Negative for hearing loss, nosebleeds, sore throat and tinnitus.   Eyes:  Negative for blurred vision and double vision.  Respiratory:  Negative for cough, hemoptysis, shortness of breath and wheezing.   Cardiovascular:  Negative for chest pain, palpitations and leg swelling.  Gastrointestinal:  Negative for abdominal pain, blood in  stool, constipation, diarrhea, melena, nausea and vomiting.  Genitourinary:  Negative for dysuria and urgency.  Musculoskeletal:  Negative for back pain, falls, joint pain and myalgias.  Skin:  Negative for itching and rash.  Neurological:  Negative for dizziness, tingling, sensory change, loss of consciousness, weakness and headaches.  Endo/Heme/Allergies:  Negative for environmental allergies. Does not bruise/bleed easily.  Psychiatric/Behavioral:  Negative for depression and suicidal ideas. The patient is not nervous/anxious and does not have insomnia.   As per HPI. Otherwise, a complete review of systems is negative.  PAST MEDICAL HISTORY: Past Medical History:  Diagnosis Date   Breast cancer (HCC) 07/2021   right   Diabetes mellitus without complication (HCC)    Headache    migraines   Hypertension    Passed out    Personal history of radiation therapy     PAST SURGICAL HISTORY: Past Surgical History:  Procedure Laterality Date   BREAST BIOPSY Right 07/07/2021   stereo bx, ribbon clip,  DUCTAL CARCINOMA IN SITU,   BREAST BIOPSY Right 07/07/2021   Stereo bx,X clip,  DUCTAL CARCINOMA IN SITU   BREAST LUMPECTOMY WITH NEEDLE LOCALIZATION Right 08/04/2021   Procedure: BREAST LUMPECTOMY WITH NEEDLE LOCALIZATION;  Surgeon: Earline Mayotte, MD;  Location: [facility] ORS;  Service: General;  Laterality: Right;    FAMILY HISTORY: Family History  Problem Relation Age of Onset   Hypertension Mother    Cancer Mother    Diabetes Mother    Hypertension Father    Heart attack Father    Diabetes Father    Stroke Brother    Sickle cell anemia Son    Breast  cancer Neg Hx     ADVANCED DIRECTIVES (Y/N):  N  HEALTH MAINTENANCE: Social History   Tobacco Use   Smoking status: Former    Years: 10    Types: Cigarettes    Quit date: 05/2021    Years since quitting: 1.8   Smokeless tobacco: Never  Vaping Use   Vaping Use: Never used  Substance Use Topics   Alcohol use: Yes     Alcohol/week: 1.0 - 2.0 standard drink of alcohol    Types: 1 - 2 Shots of liquor per week   Drug use: Yes    Types: Marijuana    Comment: last use 08/21/21     Colonoscopy:  PAP:  Bone density:  Lipid panel:  Allergies  Allergen Reactions   Sulfamethoxazole-Trimethoprim     Breaks out in welts    Current Outpatient Medications  Medication Sig Dispense Refill   amLODipine (NORVASC) 10 MG tablet Take 1 tablet (10 mg total) by mouth once daily. 30 tablet 1   atorvastatin (LIPITOR) 10 MG tablet Take 1 tablet (10 mg total) by mouth once daily. 30 tablet 1   chlorthalidone (HYGROTON) 25 MG tablet Take 1 tablet (25 mg total) by mouth once daily. 30 tablet 1   metFORMIN (GLUCOPHAGE) 500 MG tablet      potassium chloride SA (KLOR-CON M) 20 MEQ tablet Take 20 mEq by mouth 2 (two) times daily.     prochlorperazine (COMPAZINE) 10 MG tablet Take 1 tablet (10 mg total) by mouth every 6 (six) hours as needed. 12 tablet 0   SILVADENE 1 % cream Apply 1 application topically to the affected area(s) 2 (two) times daily. 85 g 3   tamoxifen (NOLVADEX) 20 MG tablet Take 1 tablet by mouth once daily 84 tablet 0   famotidine (PEPCID) 20 MG tablet Take 1 tablet (20 mg total) by mouth once daily. 30 tablet 1   lisinopril (ZESTRIL) 20 MG tablet TAKE ONE TABLET BY MOUTH ONCE DAILY AT BEDTIME 30 tablet 1   venlafaxine (EFFEXOR) 37.5 MG tablet Take 1 tablet (37.5 mg total) by mouth daily for 7 days, THEN 1 tablet (37.5 mg total) in the morning and at bedtime. For hot flashes. 113 tablet 0   No current facility-administered medications for this visit.    OBJECTIVE: There were no vitals filed for this visit.     There is no height or weight on file to calculate BMI.    ECOG FS:0 - Asymptomatic  General: no acute distress. Lungs: No audible wheezing or coughing Neuro: Alert, answering all questions appropriately.  Psych: Normal affect.   ASSESSMENT: ER positive DCIS, right breast. On tamoxifen.    PLAN:    ER positive DCIS, right breast: Lumpectomy completed on August 04, 2021 confirmed the above-stated diagnosis with no invasive component noted.  Because of that, patient did not require adjuvant chemotherapy.  She completed adjuvant XRT in approximately January 2023.  Continue tamoxifen for a total of 5 years completing treatment in January 2028.  No further intervention is needed.  Return to clinic in 6 months for routine evaluation.  Mammograms are coordinated by Dr. Rutherford Nail office. Since his retirement we will order mammograms. Next in October 2024.  Her most recent mammogram was 07/24/2022 and was reported as benign postsurgical changes in the right breast and no mammographic evidence of malignancy bilaterally.  Plan to repeat in October 2024.   Hot flashes: Moderate. On venlafaxine 37.5 mg twice a day. Tolerating well. Ongoing night time  symptoms but improved daytime symptoms. Start gabapentin 100 mg with plan to uptitrate to 200 mg if tolerating. Can increase to 300 mg if unrelieved symptoms. Avoid hormonal agents given dcis.  If symptoms do not improve with the venlafaxine could also consider tamoxifen holiday given low-grade DCIS. Breast pain: Likely postsurgical.  Resolved.  Leg pain: Unclear etiology and unrelated to her DCIS.  Possibly underlying arthritis.  Recommended patient will use Tylenol or ibuprofen sparingly.   Bone health-she has baseline bone density exam scheduled for later this month. Dexa scan on 08/19/22 was normal with T score 0.7. Managed by pcp.  Syncopal event & fall- at home. Fell against bathtub. Did not seek medical attention. S/p work up with pcp. No repeated episodes  Disposition:  1-2 months- telephone visit to f/u on hot flashes  I discussed the assessment and treatment plan with the patient. The patient was provided an opportunity to ask questions and all were answered. The patient agreed with the plan and demonstrated an understanding of the  instructions.   The patient was advised to call back or seek an in-person evaluation if the symptoms worsen or if the condition fails to improve as anticipated.  I provided 15 minutes of non face-to-face telephone visit time during this encounter, and > 50% was spent counseling as documented under my assessment & plan.   Consuello Masse, DNP, AGNP-C [facility]     Cancer Staging  Ductal carcinoma in situ (DCIS) of right breast Staging form: Breast, AJCC 8th Edition - Clinical stage from 07/15/2021: Stage 0 (cTis (DCIS), cN0, cM0, ER+, PR+, HER2-) - Signed by Jeralyn Ruths, MD on 07/15/2021 Stage prefix: Initial diagnosis Nuclear grade: Tiana Loft, NP   04/04/2023

## 2023-04-16 ENCOUNTER — Other Ambulatory Visit: Payer: Self-pay | Admitting: Nurse Practitioner

## 2023-05-16 ENCOUNTER — Inpatient Hospital Stay: Payer: Medicaid Other | Attending: Nurse Practitioner | Admitting: Nurse Practitioner

## 2023-05-16 ENCOUNTER — Encounter: Payer: Self-pay | Admitting: Oncology

## 2023-05-24 ENCOUNTER — Telehealth: Payer: Self-pay

## 2023-05-24 NOTE — Telephone Encounter (Signed)
CSW attempted to contact patient per the request of Mady Haagensen.  Call was unable to go through.  Will attempt again.

## 2023-05-25 ENCOUNTER — Emergency Department
Admission: EM | Admit: 2023-05-25 | Discharge: 2023-05-25 | Disposition: A | Discharge status: Home / Self Care | Payer: Medicaid Other | Attending: Student in an Organized Health Care Education/Training Program | Admitting: Student in an Organized Health Care Education/Training Program

## 2023-05-25 ENCOUNTER — Emergency Department: Payer: Medicaid Other

## 2023-05-25 ENCOUNTER — Other Ambulatory Visit: Payer: Self-pay

## 2023-05-25 DIAGNOSIS — M1712 Unilateral primary osteoarthritis, left knee: Secondary | ICD-10-CM | POA: Diagnosis not present

## 2023-05-25 DIAGNOSIS — I10 Essential (primary) hypertension: Secondary | ICD-10-CM | POA: Diagnosis not present

## 2023-05-25 DIAGNOSIS — M25562 Pain in left knee: Secondary | ICD-10-CM | POA: Diagnosis present

## 2023-05-25 NOTE — ED Triage Notes (Signed)
Pt presents to ED with c/o of L knee pain that has been ongoing for 1 month. Pt denies fall. Pt ambulatory to triage with no distress and steady gait. NAD noted.

## 2023-05-25 NOTE — ED Provider Notes (Signed)
[facility] Provider Note    Event Date/Time   First MD Initiated Contact with Patient 05/25/23 [phone]     (approximate)   History   Knee Pain   HPI  Cindy Burke is a 57 y.o. female who presents today for evaluation of left knee pain for the past several months.  She denies any injuries.  She has not seen orthopedics.  She denies numbness or tingling.  She is still able to ambulate normally.  Patient Active Problem List   Diagnosis Date Noted   Depression    Left knee pain 07/27/2021   Itching of both hands 07/27/2021   Abnormal renal function test 07/27/2021   Breast wound 07/27/2021   Ductal carcinoma in situ (DCIS) of right breast 07/15/2021   Abnormal mammogram 06/28/2021   History of snoring 03/16/2021   History of anxiety 07/28/2020   Elevated lipids 04/21/2020   Prediabetes 04/21/2020   Health care maintenance 04/21/2020   Health care maintenance 04/21/2020   Right shoulder pain 04/21/2020   Cramping of feet 04/21/2020   Encephalopathy acute 04/28/2018   Syncope 06/06/2016   Essential hypertension 12/15/2015   Heavy menstrual bleeding 12/15/2015          Physical Exam   Triage Vital Signs: ED Triage Vitals  Encounter Vitals Group     BP 05/25/23 0935 97/68     Systolic BP Percentile --      Diastolic BP Percentile --      Pulse Rate 05/25/23 0935 88     Resp 05/25/23 0935 20     Temp 05/25/23 0935 98.6 F (37 C)     Temp Source 05/25/23 0935 Oral     SpO2 05/25/23 0935 97 %     Weight --      Height --      Head Circumference --      Peak Flow --      Pain Score 05/25/23 0939 10     Pain Loc --      Pain Education --      Exclude from Growth Chart --     Most recent vital signs: Vitals:   05/25/23 0935  BP: 97/68  Pulse: 88  Resp: 20  Temp: 98.6 F (37 C)  SpO2: 97%    Physical Exam Vitals and nursing note reviewed.  Constitutional:      General: Awake and alert. No acute distress.    Appearance:  Normal appearance. The patient is obese.  HENT:     Head: Normocephalic and atraumatic.     Mouth: Mucous membranes are moist.  Eyes:     General: PERRL. Normal EOMs        Right eye: No discharge.        Left eye: No discharge.     Conjunctiva/sclera: Conjunctivae normal.  Cardiovascular:     Rate and Rhythm: Normal rate and regular rhythm.     Pulses: Normal pulses.  Pulmonary:     Effort: Pulmonary effort is normal. No respiratory distress.     Breath sounds: Normal breath sounds.  Abdominal:     Abdomen is soft. There is no abdominal tenderness. No rebound or guarding. No distention. Musculoskeletal:        General: No swelling. Normal range of motion.     Cervical back: Normal range of motion and neck supple.  Left knee: No deformity or rash.  Mild anterior, lateral, and medial joint line tenderness. No patellar tenderness, no  ballotment Warm and well perfused extremity with 2+ pedal pulses 5/5 strength to dorsiflexion and plantarflexion at the ankle with intact sensation throughout extremity Normal range of motion of the knee, with intact flexion and extension to active and passive range of motion. Extensor mechanism intact. No ligamentous laxity. Negative anterior/posterior drawer/negative lachman, negative mcmurrays No effusion or warmth Intact quadriceps, hamstring function, patellar tendon function Pelvis stable Full ROM of ankle without pain or swelling Foot warm and well perfused No pitting edema in lower extremity.  Circumference equal to opposite. Skin:    General: Skin is warm and dry.     Capillary Refill: Capillary refill takes less than 2 seconds.     Findings: No rash.  Neurological:     Mental Status: The patient is awake and alert.      ED Results / Procedures / Treatments   Labs (all labs ordered are listed, but only abnormal results are displayed) Labs Reviewed - No data to display   EKG     RADIOLOGY I independently reviewed and interpreted  imaging and agree with radiologists findings.     PROCEDURES:  Critical Care performed:   Procedures   MEDICATIONS ORDERED IN ED: Medications - No data to display   IMPRESSION / MDM / ASSESSMENT AND PLAN / ED COURSE  I reviewed the triage vital signs and the nursing notes.   Differential diagnosis includes, but is not limited to, osteoarthritis, effusion, sprain, contusion, dislocation, fracture, joint infection, tendon rupture. No evidence of neurological deficit or vascular compromise on exam. No fracture/dislocation on X-Ray. No deformity or obvious ligamentous laxity on exam. No constitutional symptoms or effusion to suggest septic joint. No history of immunosuppression. Overall well appearing, vital signs stable. No indication for diagnostic or therapeutic procedure such as arthrocentesis.  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  There is no pitting edema in her lower extremity, circumference is equal to opposite, do not suspect DVT.  She denies history of DVT.  She was instructed to follow-up with orthopedics for continued management.  The appropriate follow-up information was provided.  Return precautions and care instructions discussed. Outpatient follow-up advised. Patient agrees with plan of care.    Patient's presentation is most consistent with acute complicated illness / injury requiring diagnostic workup.      FINAL CLINICAL IMPRESSION(S) / ED DIAGNOSES   Final diagnoses:  Osteoarthritis of left knee, unspecified osteoarthritis type     Rx / DC Orders   ED Discharge Orders     None        Note:  This document was prepared using Dragon voice recognition software and may include unintentional dictation errors.   Keturah Shavers 05/25/23 1041    Willy Eddy, MD 05/25/23 [phone]

## 2023-05-25 NOTE — Discharge Instructions (Signed)
Please follow-up with orthopedics for your tricompartmental osteoarthritis.  Please return for any new, worsening, or change in symptoms or other concerns.  It was a pleasure caring for you today.

## 2023-06-03 ENCOUNTER — Other Ambulatory Visit: Payer: Self-pay | Admitting: Nurse Practitioner

## 2023-06-03 ENCOUNTER — Other Ambulatory Visit: Payer: Self-pay | Admitting: Oncology

## 2023-06-04 ENCOUNTER — Inpatient Hospital Stay: Payer: Medicaid Other | Attending: Nurse Practitioner | Admitting: Oncology

## 2023-06-10 ENCOUNTER — Encounter: Payer: Self-pay | Admitting: Oncology

## 2023-06-19 ENCOUNTER — Other Ambulatory Visit: Payer: Self-pay | Admitting: Surgery

## 2023-06-19 DIAGNOSIS — Z853 Personal history of malignant neoplasm of breast: Secondary | ICD-10-CM

## 2023-07-31 ENCOUNTER — Ambulatory Visit
Admission: RE | Admit: 2023-07-31 | Discharge: 2023-07-31 | Disposition: A | Discharge status: Home / Self Care | Payer: Medicaid Other | Source: Ambulatory Visit | Attending: Surgery | Admitting: Surgery

## 2023-07-31 DIAGNOSIS — Z853 Personal history of malignant neoplasm of breast: Secondary | ICD-10-CM | POA: Diagnosis present

## 2023-08-08 ENCOUNTER — Other Ambulatory Visit: Payer: Self-pay | Admitting: Nurse Practitioner

## 2023-08-12 ENCOUNTER — Encounter: Payer: Self-pay | Admitting: Surgery

## 2023-09-23 ENCOUNTER — Inpatient Hospital Stay: Payer: Medicaid Other | Admitting: Oncology

## 2023-09-27 ENCOUNTER — Ambulatory Visit: Payer: Medicaid Other | Admitting: Oncology

## 2023-10-04 ENCOUNTER — Inpatient Hospital Stay: Payer: Medicaid Other | Admitting: Oncology

## 2023-10-30 ENCOUNTER — Ambulatory Visit: Payer: Medicaid Other | Attending: Radiation Oncology | Admitting: Radiation Oncology

## 2024-02-21 ENCOUNTER — Inpatient Hospital Stay: Attending: Oncology | Admitting: Oncology

## 2024-02-21 ENCOUNTER — Encounter: Payer: Self-pay | Admitting: Oncology

## 2024-02-21 NOTE — Progress Notes (Unsigned)
 Sleep Medicine   Office Visit  Patient Name: Cindy Burke DOB: 11-08-1965 MRN [id number]    Chief Complaint: ***  Brief History:  [name] presents for an initial consult for sleep evaluation and to establish care. Patient has a *** history of ***. Sleep quality is ***. This is noted *** night. The patient's bed partner reports  *** at night. The patient relates the following symptoms: *** are also present. The patient goes to sleep at *** and wakes up at ***.  Sleep quality is *** when outside home environment.  Patient has noted *** of her legs at night that would disrupt her sleep.  The patient  relates *** as unusual behavior during the night.  The patient relates  *** as a history of psychiatric problems. The Epworth Sleepiness Score is *** out of 24 .  The patient relates  Cardiovascular risk factors include: *** The patient reports ***   ROS  General: (-) fever, (-) chills, (-) night sweat Nose and Sinuses: (-) nasal stuffiness or itchiness, (-) postnasal drip, (-) nosebleeds, (-) sinus trouble. Mouth and Throat: (-) sore throat, (-) hoarseness. Neck: (-) swollen glands, (-) enlarged thyroid, (-) neck pain. Respiratory: *** cough, *** shortness of breath, *** wheezing. Neurologic: *** numbness, *** tingling. Psychiatric: *** anxiety, *** depression Sleep behavior: ***sleep paralysis ***hypnogogic hallucinations ***dream enactment      ***vivid dreams ***cataplexy ***night terrors ***sleep walking   Current Medication: Outpatient Encounter Medications as of 02/24/2024  Medication Sig   amLODipine  (NORVASC ) 10 MG tablet Take 1 tablet (10 mg total) by mouth once daily.   atorvastatin  (LIPITOR) 10 MG tablet Take 1 tablet (10 mg total) by mouth once daily.   chlorthalidone  (HYGROTON ) 25 MG tablet Take 1 tablet (25 mg total) by mouth once daily.   famotidine  (PEPCID ) 20 MG tablet Take 1 tablet (20 mg total) by mouth once daily.   gabapentin  (NEURONTIN ) 100 MG capsule TAKE 2  CAPSULES BY MOUTH AT BEDTIME FOR HOT FLASHES   lisinopril  (ZESTRIL ) 20 MG tablet TAKE ONE TABLET BY MOUTH ONCE DAILY AT BEDTIME   metFORMIN  (GLUCOPHAGE ) 500 MG tablet    potassium chloride  SA (KLOR-CON  M) 20 MEQ tablet Take 20 mEq by mouth 2 (two) times daily.   prochlorperazine  (COMPAZINE ) 10 MG tablet Take 1 tablet (10 mg total) by mouth every 6 (six) hours as needed.   SILVADENE  1 % cream Apply 1 application topically to the affected area(s) 2 (two) times daily.   tamoxifen  (NOLVADEX ) 20 MG tablet Take 1 tablet by mouth once daily   venlafaxine  (EFFEXOR ) 37.5 MG tablet Take 1 tablet (37.5 mg total) by mouth 2 (two) times daily.   No facility-administered encounter medications on file as of 02/24/2024.    Surgical History: Past Surgical History:  Procedure Laterality Date   BREAST BIOPSY Right 07/07/2021   stereo bx, ribbon clip,  DUCTAL CARCINOMA IN SITU,   BREAST BIOPSY Right 07/07/2021   Stereo bx,X clip,  DUCTAL CARCINOMA IN SITU   BREAST LUMPECTOMY WITH NEEDLE LOCALIZATION Right 08/04/2021   Procedure: BREAST LUMPECTOMY WITH NEEDLE LOCALIZATION;  Surgeon: Marshall Skeeter, MD;  Location: [facility] ORS;  Service: General;  Laterality: Right;    Medical History: Past Medical History:  Diagnosis Date   Breast cancer (HCC) 07/2021   right   Diabetes mellitus without complication (HCC)    Headache    migraines   Hypertension    Passed out    Personal history of radiation therapy  Family History: Non contributory to the present illness  Social History: Social History   Socioeconomic History   Marital status: Single    Spouse name: Not on file   Number of children: Not on file   Years of education: Not on file   Highest education level: Not on file  Occupational History   Occupation: Lobbyist: [employer]  Tobacco Use   Smoking status: Former    Current packs/day: 0.00    Types: Cigarettes    Start date: 05/2011    Quit date: 05/2021    Years since  quitting: 2.7   Smokeless tobacco: Never  Vaping Use   Vaping status: Never Used  Substance and Sexual Activity   Alcohol use: Yes    Alcohol/week: 1.0 - 2.0 standard drink of alcohol    Types: 1 - 2 Shots of liquor per week   Drug use: Yes    Types: Marijuana    Comment: last use 08/21/21   Sexual activity: Not on file  Other Topics Concern   Not on file  Social History Narrative   Sometimes has trouble getting to work, relies on other people to take the because [employer] bus does not run far enough, also has trouble falling asleep occassionally   Social Drivers of Corporate investment banker Strain: High Risk (07/28/2020)   Overall Financial Resource Strain (CARDIA)    Difficulty of Paying Living Expenses: Very hard  Food Insecurity: No Food Insecurity (03/16/2021)   Hunger Vital Sign    Worried About Running Out of Food in the Last Year: Never true    Ran Out of Food in the Last Year: Never true  Transportation Needs: Unmet Transportation Needs (03/16/2021)   PRAPARE - Transportation    Lack of Transportation (Medical): Yes    Lack of Transportation (Non-Medical): Yes  Physical Activity: Insufficiently Active (08/03/2019)   Exercise Vital Sign    Days of Exercise per Week: 3 days    Minutes of Exercise per Session: 30 min  Stress: Stress Concern Present (07/28/2020)   [employer] of Occupational Health - Occupational Stress Questionnaire    Feeling of Stress : Very much  Social Connections: Socially Isolated (07/21/2020)   Social Connection and Isolation Panel [NHANES]    Frequency of Communication with Friends and Family: Twice a week    Frequency of Social Gatherings with Friends and Family: Once a week    Attends Religious Services: Never    Database administrator or Organizations: No    Attends Banker Meetings: Never    Marital Status: Never married  Intimate Partner Violence: Not At Risk (06/11/2019)   Humiliation, Afraid, Rape, and Kick questionnaire     Fear of Current or Ex-Partner: No    Emotionally Abused: No    Physically Abused: No    Sexually Abused: No    Vital Signs: There were no vitals taken for this visit. There is no height or weight on file to calculate BMI.   Examination: General Appearance: The patient is well-developed, well-nourished, and in no distress. Neck Circumference: *** Skin: Gross inspection of skin unremarkable. Head: normocephalic, no gross deformities. Eyes: no gross deformities noted. ENT: ears appear grossly normal Neurologic: Alert and oriented. No involuntary movements.    STOP BANG RISK ASSESSMENT S (snore) Have you been told that you snore?     YES   T (tired) Are you often tired, fatigued, or sleepy during the day?   YES  O (obstruction) Do you stop breathing, choke, or gasp during sleep? YES   P (pressure) Do you have or are you being treated for high blood pressure? YES/NO   B (BMI) Is your body index greater than 35 kg/m? YES/NO   A (age) Are you 67 years old or older? YES   N (neck) Do you have a neck circumference greater than 16 inches?   YES/NO   G (gender) Are you a female? NO   TOTAL STOP/BANG "YES" ANSWERS                                                                A STOP-Bang score of 2 or less is considered low risk, and a score of 5 or more is high risk for having either moderate or severe OSA. For people who score 3 or 4, doctors may need to perform further assessment to determine how likely they are to have OSA.         EPWORTH SLEEPINESS SCALE:  Scale:  (0)= no chance of dozing; (1)= slight chance of dozing; (2)= moderate chance of dozing; (3)= high chance of dozing  Chance  Situtation    Sitting and reading: ***    Watching TV: ***    Sitting Inactive in public: ***    As a passenger in car: ***      Lying down to rest: ***    Sitting and talking: ***    Sitting quielty after lunch: ***    In a car, stopped in traffic: ***   TOTAL SCORE:    *** out of 24    SLEEP STUDIES:  None   LABS: No results found for this or any previous visit (from the past 2160 hours).  Radiology: MM 3D DIAGNOSTIC MAMMOGRAM BILATERAL BREAST Result Date: 07/31/2023 CLINICAL DATA:  Here for annual mammogram. History of right lumpectomy in 2022. EXAM: DIGITAL DIAGNOSTIC BILATERAL MAMMOGRAM WITH TOMOSYNTHESIS AND CAD TECHNIQUE: Bilateral digital diagnostic mammography and breast tomosynthesis was performed. The images were evaluated with computer-aided detection. COMPARISON:  Previous exam(s). ACR Breast Density Category a: The breasts are almost entirely fatty. FINDINGS: Post lumpectomy changes are seen in the right breast. No suspicious mass, microcalcification, or other finding is identified in either breast. IMPRESSION: No evidence of malignancy. RECOMMENDATION: Recommend routine annual screening mammogram in 1 year. I have discussed the findings and recommendations with the patient. If applicable, a reminder letter will be sent to the patient regarding the next appointment. BI-RADS CATEGORY  2: Benign. Electronically Signed   By: Tita Form M.D.   On: 07/31/2023 13:16    No results found.  No results found.    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   Abnormal mammogram 06/28/2021   History of snoring 03/16/2021   History of anxiety 07/28/2020   Elevated lipids 04/21/2020   Prediabetes 04/21/2020   Health care maintenance 04/21/2020   Health care maintenance 04/21/2020   Right shoulder pain 04/21/2020   Cramping of feet 04/21/2020  Encephalopathy acute 04/28/2018   Syncope 06/06/2016   Essential hypertension 12/15/2015   Heavy menstrual bleeding 12/15/2015     PLAN OSA:   Patient evaluation  suggests high risk of sleep disordered breathing due to *** Patient has comorbid cardiovascular risk factors including: *** which could be exacerbated by pathologic sleep-disordered breathing.  Suggest: *** to assess/treat the patient's sleep disordered breathing. The patient was also counselled on *** to optimize sleep health.  PLAN hypersomnia:  Patient evaluation suggests significant daytime hypersomnia.  The Epworth Sleepiness Score is elevated at *** out of 24. Patient *** drowsy driving. The patient *** MVA due to sleepiness.  The patient *** restless leg symptoms which exacerbate *** for *** nights per week. The patient *** periodic limb movements which exacerbate ***  for *** nights per week. Suggest: ***  Also suggest ***  PLAN insomnia:  Patient evaluation suggests *** insomnia. This is a chronic disorder. This has been a concern for *** and causes impaired daytime functioning. The patient exhibits comorbid ***  The history *** suggest the insomnia predates the use of hypnotic medications. The symptoms *** with the discontinuation of these medications. There is no obvious medical, psychiatric or pharmacologic abuse issues ot account for the insomnia.  Treatment recommendations include: *** The patient should maintain a sleep log and calculate total sleep time for 1-2 weeks. Set bed and wake times for achieve 85% sleep efficiency for one week. Once this is achieved  time in bed can be gradually increased. A pharmacologic treatment approach would include a trial of *** for the next ***  months. During this time the patient is to maintain a sleep diary to track progress.    ***  General Counseling: I have discussed the findings of the evaluation and examination with Cornelius Dill.  I have also discussed any further diagnostic evaluation thatmay be needed or ordered today. Makinzey verbalizes understanding of the findings of todays visit. We also reviewed her medications today and discussed drug  interactions and side effects including but not limited excessive drowsiness and altered mental states. We also discussed that there is always a risk not just to her but also people around her. she has been encouraged to call the office with any questions or concerns that should arise related to todays visit.  No orders of the defined types were placed in this encounter.       I have personally obtained a history, evaluated the patient, evaluated pertinent data, formulated the assessment and plan and placed orders.    Cordie Deters, MD [facility] Diplomate ABMS Pulmonary and Critical Care Medicine Sleep medicine

## 2024-02-24 ENCOUNTER — Ambulatory Visit: Payer: Self-pay | Admitting: Internal Medicine

## 2024-04-16 IMAGING — CT CT HEAD W/O CM
4 series · 16 of 47 positions shown, 18 images · non-contrast
Comparison: 04/28/2018

CLINICAL DATA: Altered mental status



[Series 2: head wo · axial · 0.40mm/px · z∈[-115,-10]mm · 7 of 29 slices shown, 9 images]
[im 4/29  brain]
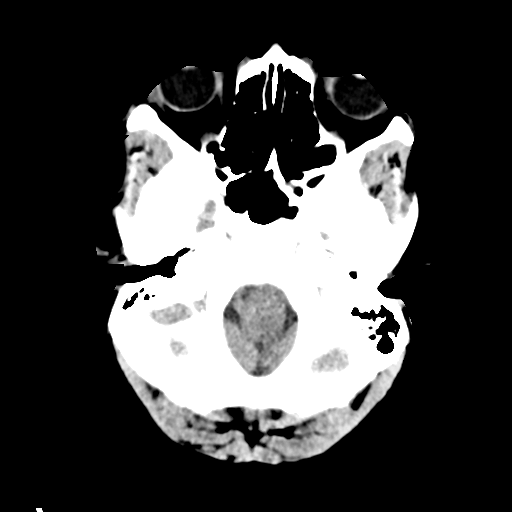
[im 4/29  bone]
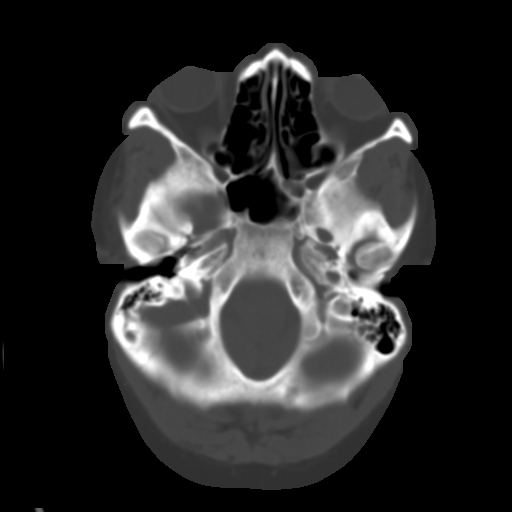
[im 8/29  brain]
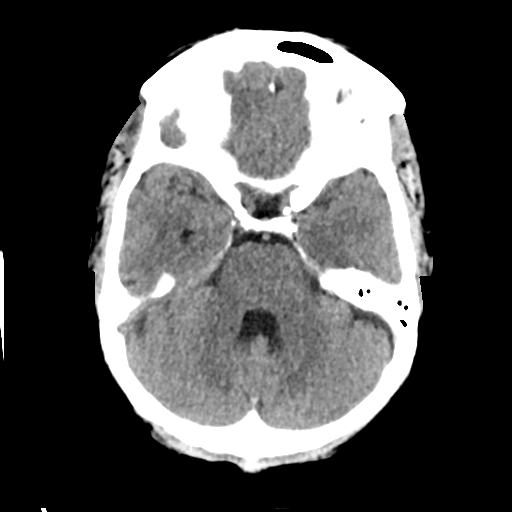
[im 11/29  brain]
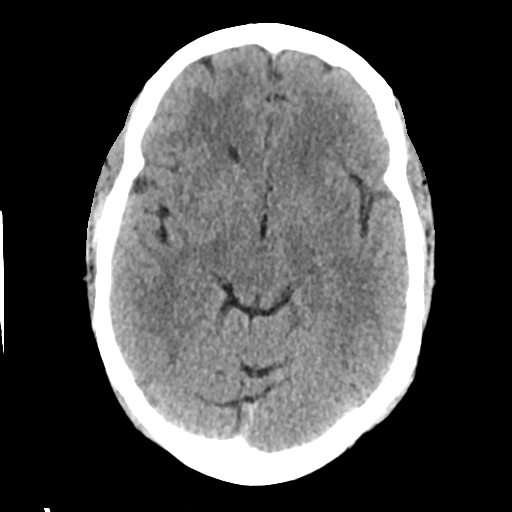
[im 15/29  brain]
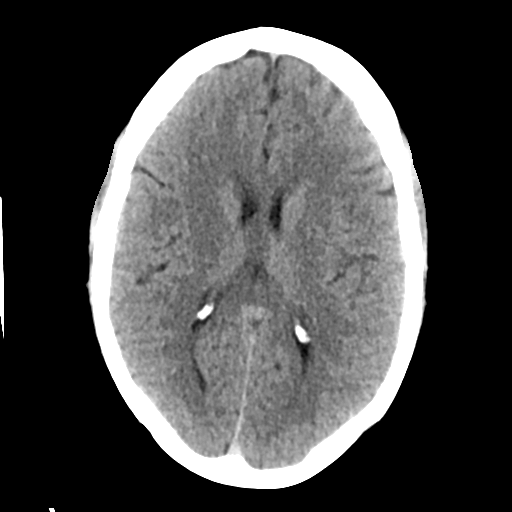
[im 18/29  brain]
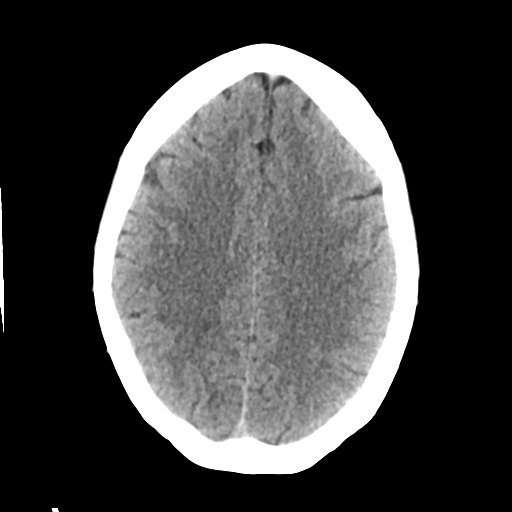
[im 18/29  bone]
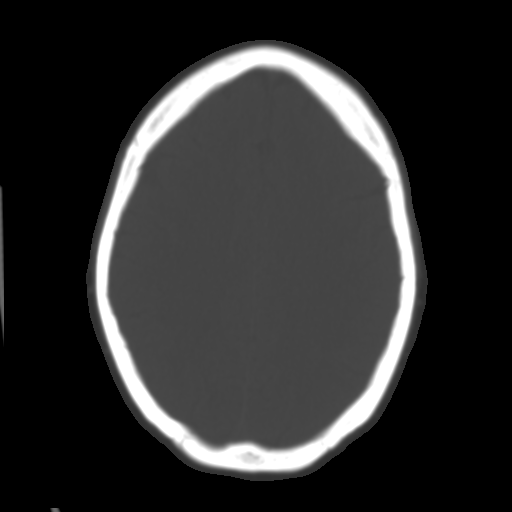
[im 22/29  brain]
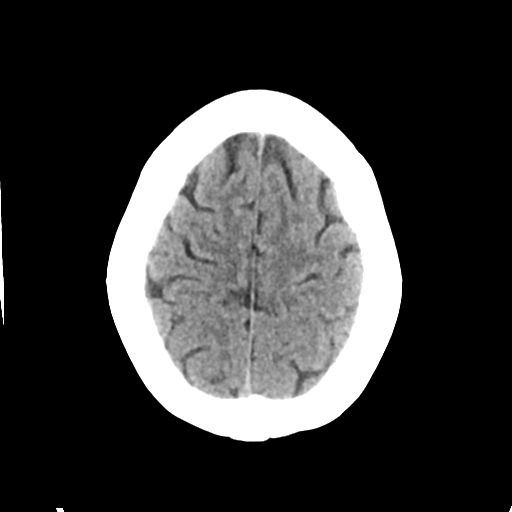
[im 25/29  brain]
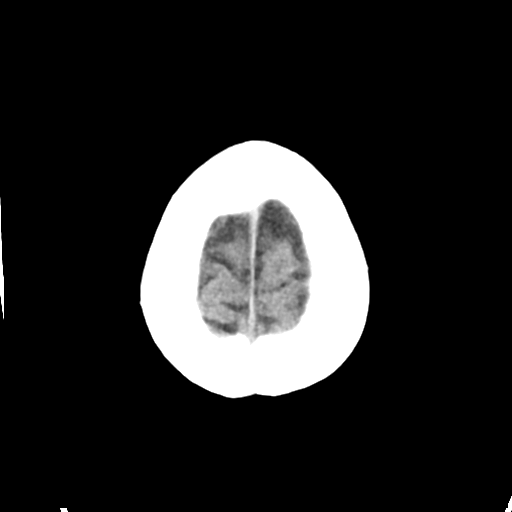

[Series 3: head bone · axial · 0.40mm/px · z∈[-116,-88]mm · 3 of 72 slices shown]
[im 8/72  bone]
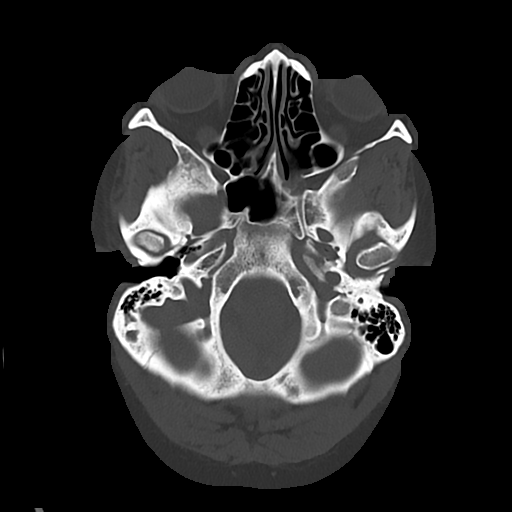
[im 15/72  bone]
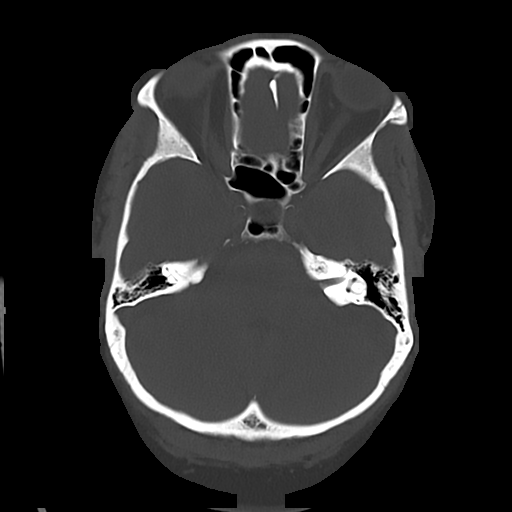
[im 22/72  bone]
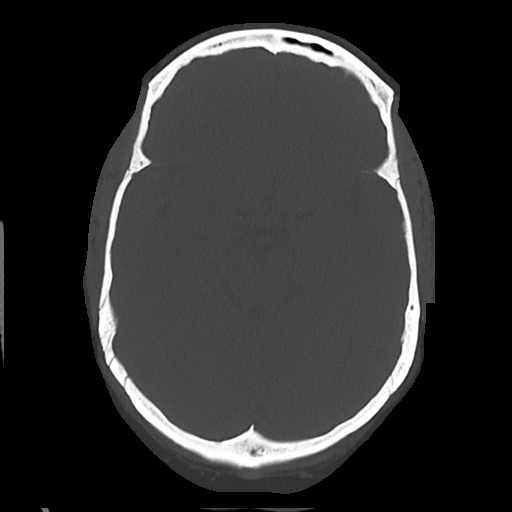

[Series 4: cor soft · coronal · 0.29mm/px · 3 of 66 slices shown]
[im 22/66  brain]
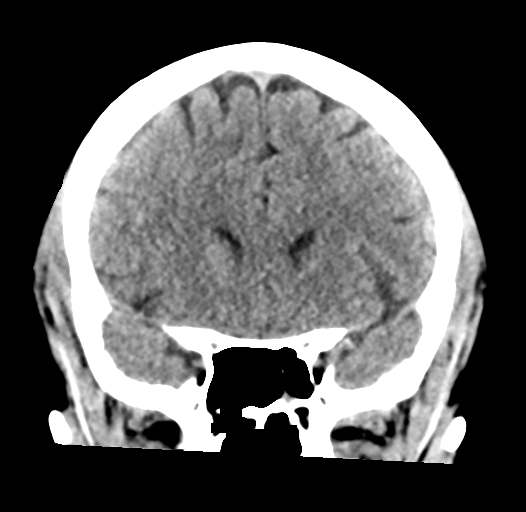
[im 29/66  brain]
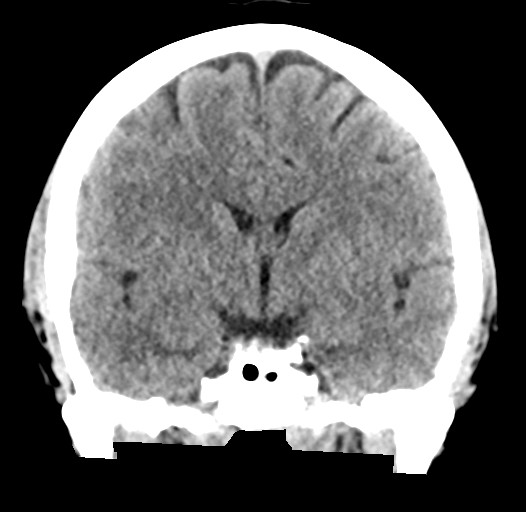
[im 37/66  brain]
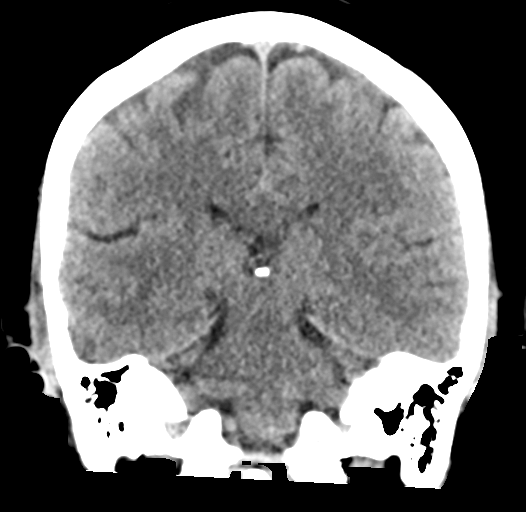

[Series 5: sag soft · sagittal · 0.29mm/px · 3 of 53 slices shown]
[im 18/53  brain]
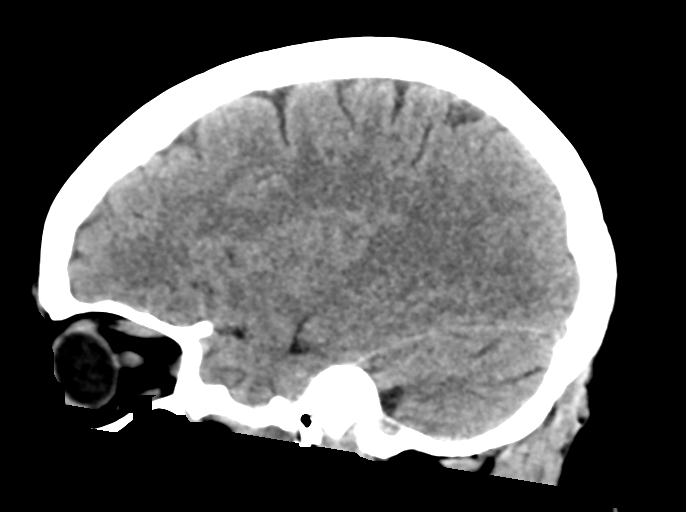
[im 27/53  brain]
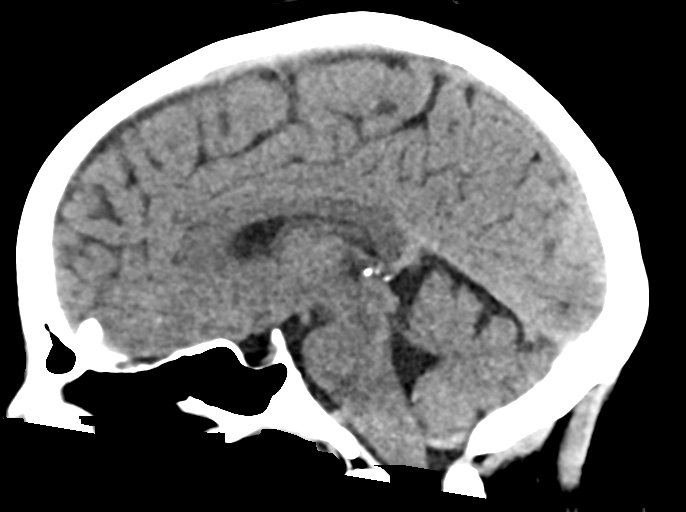
[im 35/53  brain]
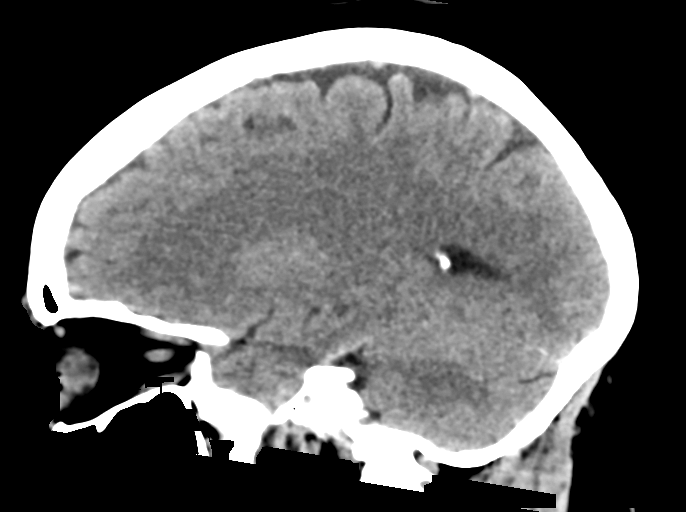

[16 of 47 positions shown; findings below may reference images not displayed]

FINDINGS: Brain: The brainstem, cerebellum, cerebral peduncles, thalami, basal
ganglia, basilar cisterns, and ventricular system appear within
normal limits. No intracranial hemorrhage, mass lesion, or acute
CVA.

Vascular: Unremarkable

Skull: Unremarkable

Sinuses/Orbits: Mild chronic left posterior ethmoid sinusitis.

Other: No supplemental non-categorized findings.
IMPRESSION: 1. No acute intracranial findings.
2. Minimal chronic left ethmoid sinusitis.

## 2024-04-16 IMAGING — MR MR HEAD W/O CM
11 series · 46 of 48 positions shown · non-contrast
Comparison: Prior CT from earlier the same day.

CLINICAL DATA: Initial evaluation for acute mental status change.

EXAM:
MRI HEAD WITHOUT CONTRAST
TECHNIQUE: Multiplanar, multiecho pulse sequences of the brain and surrounding
structures were obtained without intravenous contrast.

[Series 5: ax dwi_tracew · axial · 3.0mm · 0.65mm/px · z∈[-112,+43]mm · 4 of 48 slices shown]
[im 1/48]
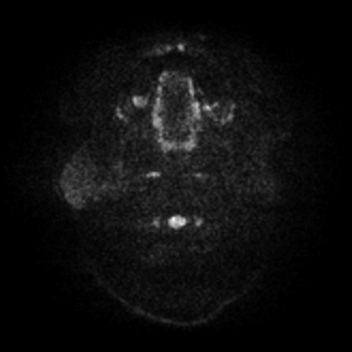
[im 16/48]
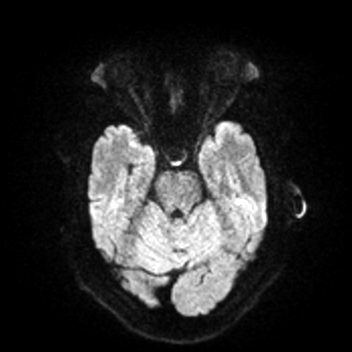
[im 32/48]
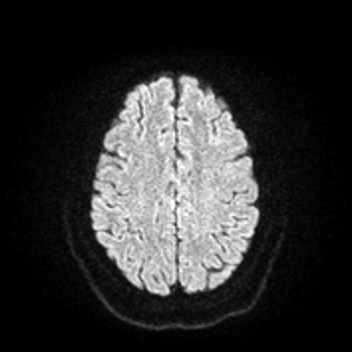
[im 48/48]
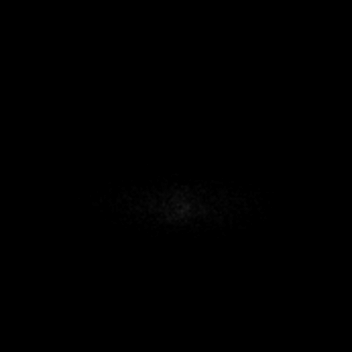

[Series 6: ax dwi_adc · axial · 3.0mm · 0.65mm/px · z∈[-112,+40]mm · 4 of 47 slices shown]
[im 1/47]
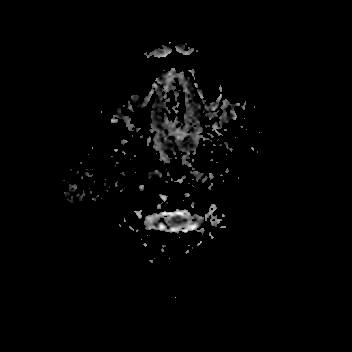
[im 16/47]
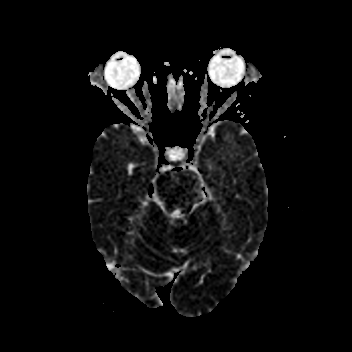
[im 31/47]
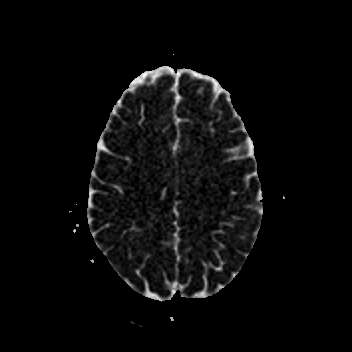
[im 47/47]
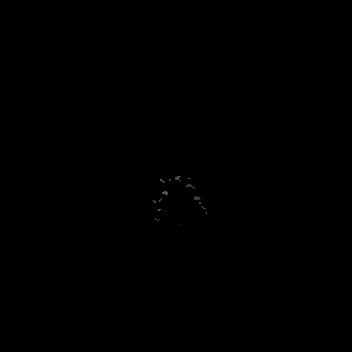

[Series 7: cor dwi_tracew · coronal · 5.0mm · 0.65mm/px · 3 of 40 slices shown]
[im 1/40]
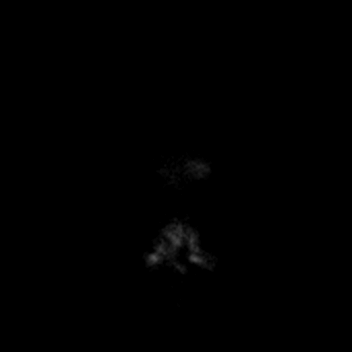
[im 20/40]
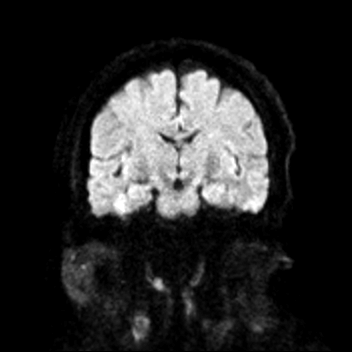
[im 40/40]
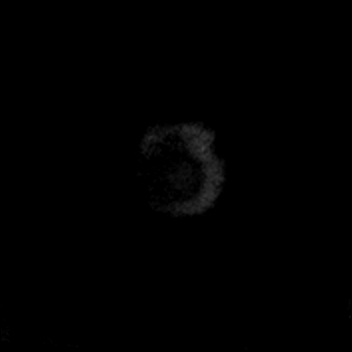

[Series 8: cor dwi_adc · coronal · 5.0mm · 0.65mm/px · 3 of 31 slices shown]
[im 1/31]
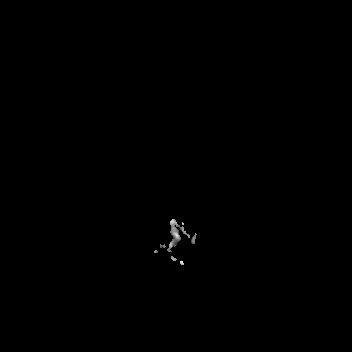
[im 16/31]
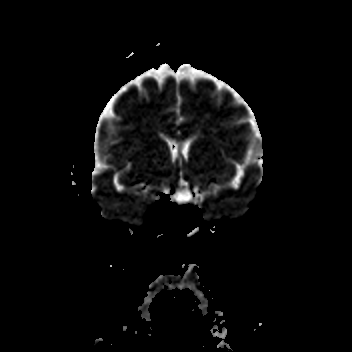
[im 31/31]
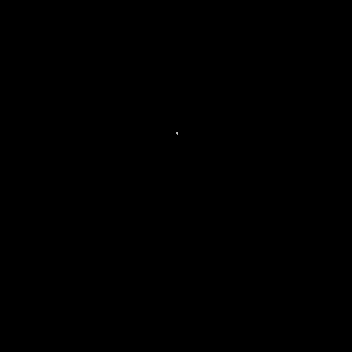

[Series 9: T1 · sagittal · 5.0mm · 0.62mm/px · 2 of 25 slices shown (1 of 2)]
[im 1/25]
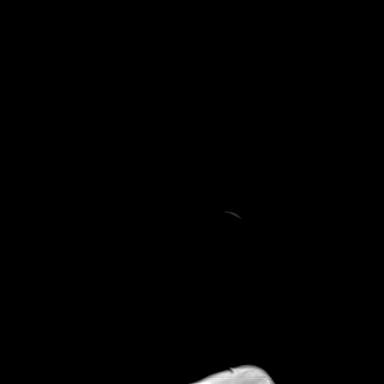
[im 25/25]
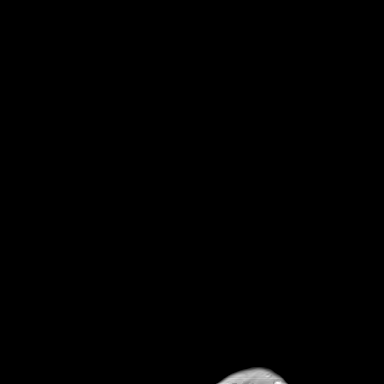

[Series 10: T2 · axial · 5.0mm · 0.53mm/px · z∈[-109,+41]mm · 2 of 26 slices shown (1 of 2)]
[im 1/26]
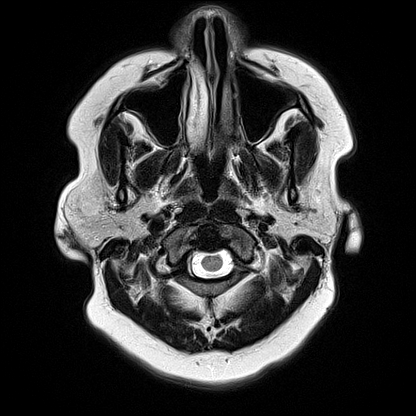
[im 26/26]
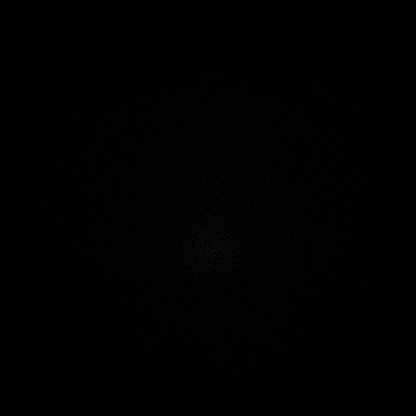

[Series 12: pha_images · axial · 3.0mm · 0.90mm/px · z∈[-120,+45]mm · 5 of 56 slices shown]
[im 1/56]
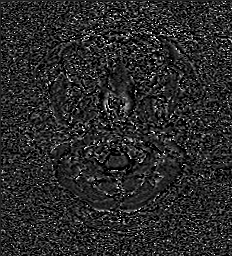
[im 14/56]
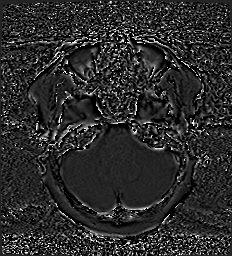
[im 28/56]
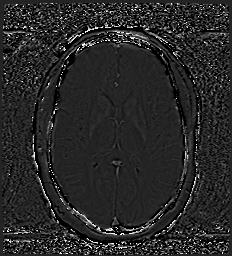
[im 42/56]
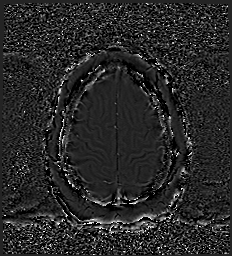
[im 56/56]
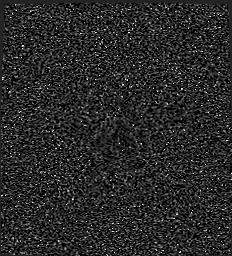

[Series 13: swi_images · axial · 3.0mm · 0.90mm/px · z∈[-123,+54]mm · 5 of 60 slices shown]
[im 1/60]
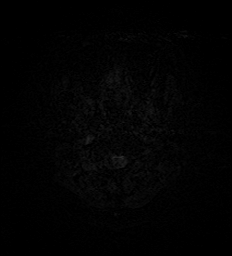
[im 15/60]
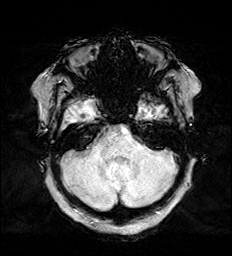
[im 30/60]
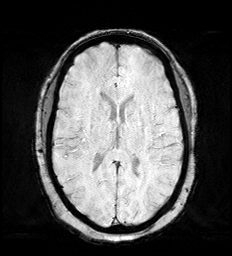
[im 45/60]
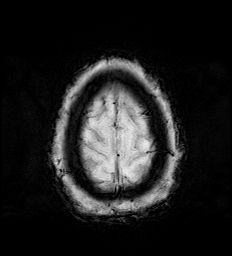
[im 60/60]
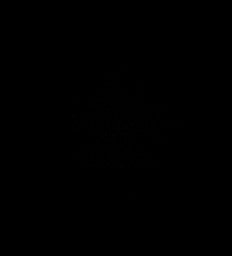

[Series 15: FLAIR · axial · 3.0mm · 0.53mm/px · z∈[-115,+47]mm · 4 of 55 slices shown]
[im 1/55]
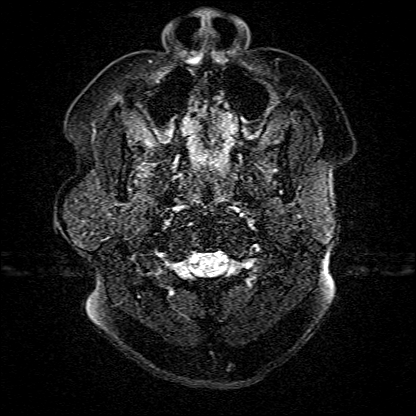
[im 19/55]
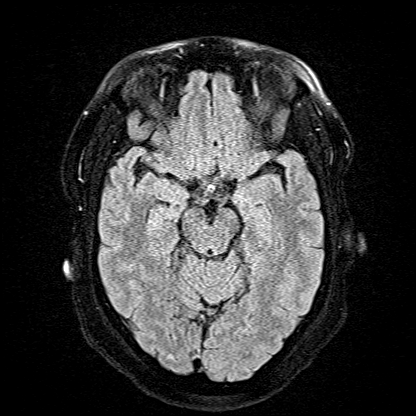
[im 37/55]
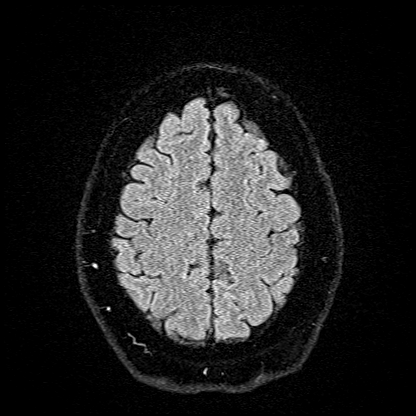
[im 55/55]
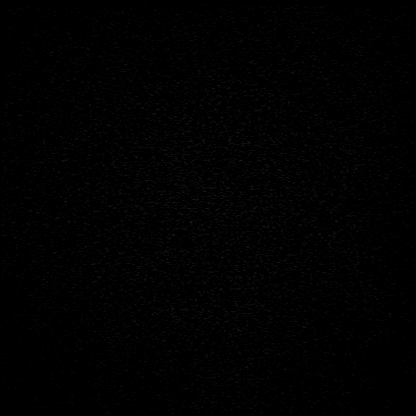

[Series 16: T1 · axial · 1.0mm · 0.98mm/px · z∈[-122,+52]mm · 12 of 174 slices shown (2 of 2)]
[im 1/174]
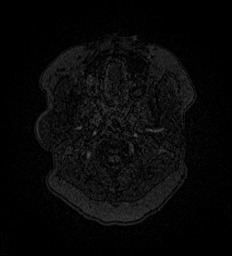
[im 14/174]
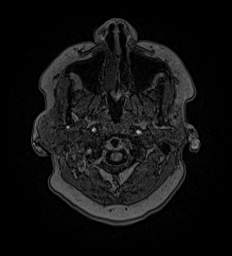
[im 27/174]
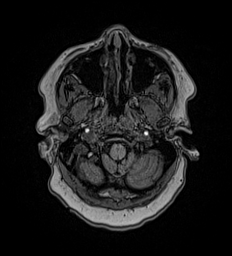
[im 40/174]
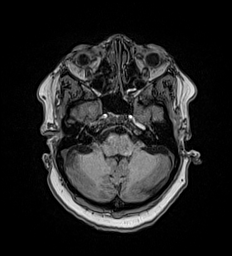
[im 54/174]
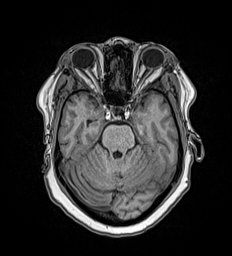
[im 67/174]
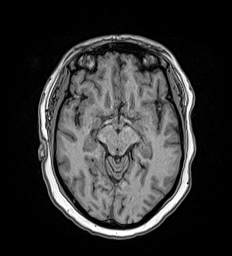
[im 80/174]
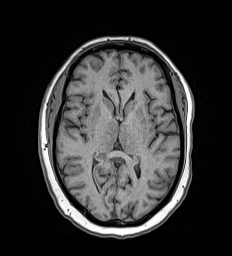
[im 94/174]
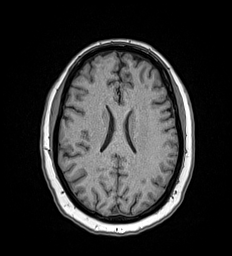
[im 107/174]
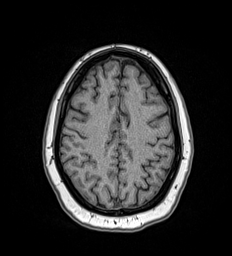
[im 120/174]
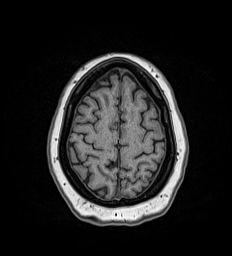
[im 147/174]
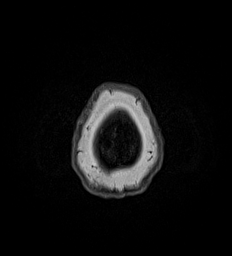
[im 174/174]
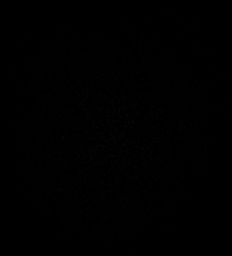

[Series 17: T2 · coronal · 5.0mm · 0.57mm/px · 2 of 30 slices shown (2 of 2)]
[im 1/30]
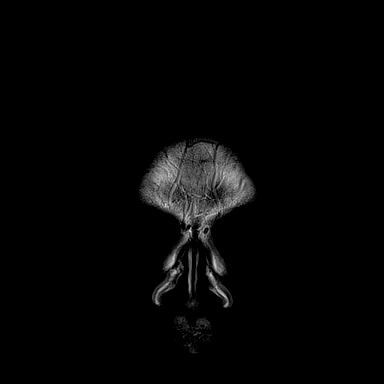
[im 30/30]
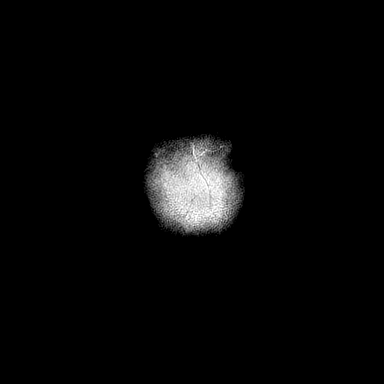

[46 of 48 positions shown; findings below may reference images not displayed]

FINDINGS: Brain: Cerebral volume within normal limits. Mild scattered patchy
T2/FLAIR hyperintensity seen involving the periventricular deep
white matter both cerebral hemispheres, nonspecific, but most
commonly related to chronic microvascular ischemic disease. Overall,
appearance is mild for age.

No evidence for acute or subacute infarct. Gray-white matter
differentiation maintained. No areas of chronic cortical infarction.
No acute or chronic intracranial blood products.

No mass lesion, midline shift or mass effect. No hydrocephalus or
extra-axial fluid collection. Pituitary gland suprasellar region
within normal limits.

Vascular: Major intracranial vascular flow voids are maintained.

Skull and upper cervical spine: Craniocervical junction normal. Bone
marrow signal intensity within normal limits. No scalp soft tissue
abnormality.

Sinuses/Orbits: Globes orbital soft tissues within normal limits.
Mild scattered mucosal thickening noted throughout the paranasal
sinuses. No significant mastoid effusion.

Other: None.
IMPRESSION: 1. No acute intracranial abnormality.
2. Mild cerebral white matter disease, nonspecific, but most
commonly related to chronic microvascular ischemic disease.

## 2024-04-21 ENCOUNTER — Encounter: Payer: Self-pay | Admitting: Nurse Practitioner

## 2024-05-15 NOTE — Progress Notes (Signed)
 Sleep Medicine   Office Visit  Patient Name: Cindy Burke DOB: 10-25-1965 MRN [id number]    Chief Complaint: sleep evaluation   Brief History:  Cindy Burke presents for an initial consult for sleep evaluation and to establish care. Patient has a longstanding history of excessive daytime sleepiness. Sleep quality is fair. This is noted most night. The patient's bed partner reports  snoring and choking at night. The patient relates the following symptoms: fatigue, headaches, trouble concentrating, brain fog, memory issues and as of recent, some fainting spells are also present. The patient goes to bed at 1100 pm but will sometimes not be able to go to sleep till about 0200 am and wakes up at 0600 am and will wake up several times in between with trouble returning to sleep.  Sleep quality is the same when outside home environment.  Patient has noted some significant movement of her legs at night that would disrupt her sleep.  The patient  relates some nightmares and vivid dreams as unusual behavior during the night.  The patient relates depression as a history of psychiatric problems. The Epworth Sleepiness Score is 19 out of 24 .  The patient relates  Cardiovascular risk factors include: HTN and syncope.    ROS  General: (-) fever, (-) chills, (-) night sweat Nose and Sinuses: (-) nasal stuffiness or itchiness, (-) postnasal drip, (-) nosebleeds, (-) sinus trouble. Mouth and Throat: (-) sore throat, (-) hoarseness. Neck: (-) swollen glands, (-) enlarged thyroid, (-) neck pain. Respiratory: - cough, + shortness of breath, - wheezing. Neurologic: - numbness, - tingling. Psychiatric: +anxiety, + depression Sleep behavior: -sleep paralysis -hypnogogic hallucinations -dream enactment      -vivid dreams -cataplexy -night terrors -sleep walking   Current Medication: Outpatient Encounter Medications as of 05/18/2024  Medication Sig   amLODipine  (NORVASC ) 10 MG tablet Take 1 tablet (10 mg total) by  mouth once daily.   atorvastatin  (LIPITOR) 10 MG tablet Take 1 tablet (10 mg total) by mouth once daily.   chlorthalidone  (HYGROTON ) 25 MG tablet Take 1 tablet (25 mg total) by mouth once daily.   famotidine  (PEPCID ) 20 MG tablet Take 1 tablet (20 mg total) by mouth once daily.   gabapentin  (NEURONTIN ) 100 MG capsule TAKE 2 CAPSULES BY MOUTH AT BEDTIME FOR HOT FLASHES   lisinopril  (ZESTRIL ) 20 MG tablet TAKE ONE TABLET BY MOUTH ONCE DAILY AT BEDTIME   metFORMIN  (GLUCOPHAGE ) 500 MG tablet    potassium chloride  SA (KLOR-CON  M) 20 MEQ tablet Take 20 mEq by mouth 2 (two) times daily.   prochlorperazine  (COMPAZINE ) 10 MG tablet Take 1 tablet (10 mg total) by mouth every 6 (six) hours as needed.   SILVADENE  1 % cream Apply 1 application topically to the affected area(s) 2 (two) times daily.   tamoxifen  (NOLVADEX ) 20 MG tablet Take 1 tablet by mouth once daily   venlafaxine  (EFFEXOR ) 37.5 MG tablet Take 1 tablet (37.5 mg total) by mouth 2 (two) times daily.   No facility-administered encounter medications on file as of 05/18/2024.    Surgical History: Past Surgical History:  Procedure Laterality Date   BREAST BIOPSY Right 07/07/2021   stereo bx, ribbon clip,  DUCTAL CARCINOMA IN SITU,   BREAST BIOPSY Right 07/07/2021   Stereo bx,X clip,  DUCTAL CARCINOMA IN SITU   BREAST LUMPECTOMY WITH NEEDLE LOCALIZATION Right 08/04/2021   Procedure: BREAST LUMPECTOMY WITH NEEDLE LOCALIZATION;  Surgeon: Dessa Reyes ORN, MD;  Location: [facility] ORS;  Service: General;  Laterality: Right;  Medical History: Past Medical History:  Diagnosis Date   Breast cancer (HCC) 07/2021   right   Diabetes mellitus without complication (HCC)    Headache    migraines   Hypertension    Passed out    Personal history of radiation therapy     Family History: Non contributory to the present illness  Social History: Social History   Socioeconomic History   Marital status: Single    Spouse name: Not on file    Number of children: Not on file   Years of education: Not on file   Highest education level: Not on file  Occupational History   Occupation: Lobbyist: [employer]  Tobacco Use   Smoking status: Former    Current packs/day: 0.00    Types: Cigarettes    Start date: 05/2011    Quit date: 05/2021    Years since quitting: 2.9   Smokeless tobacco: Never  Vaping Use   Vaping status: Never Used  Substance and Sexual Activity   Alcohol use: Yes    Alcohol/week: 1.0 - 2.0 standard drink of alcohol    Types: 1 - 2 Shots of liquor per week   Drug use: Yes    Types: Marijuana    Comment: last use 08/21/21   Sexual activity: Not on file  Other Topics Concern   Not on file  Social History Narrative   Sometimes has trouble getting to work, relies on other people to take the because [employer] bus does not run far enough, also has trouble falling asleep occassionally   Social Drivers of Corporate investment banker Strain: High Risk (07/28/2020)   Overall Financial Resource Strain (CARDIA)    Difficulty of Paying Living Expenses: Very hard  Food Insecurity: No Food Insecurity (03/16/2021)   Hunger Vital Sign    Worried About Running Out of Food in the Last Year: Never true    Ran Out of Food in the Last Year: Never true  Transportation Needs: Unmet Transportation Needs (03/16/2021)   PRAPARE - Transportation    Lack of Transportation (Medical): Yes    Lack of Transportation (Non-Medical): Yes  Physical Activity: Insufficiently Active (08/03/2019)   Exercise Vital Sign    Days of Exercise per Week: 3 days    Minutes of Exercise per Session: 30 min  Stress: Stress Concern Present (07/28/2020)   [employer] of Occupational Health - Occupational Stress Questionnaire    Feeling of Stress : Very much  Social Connections: Socially Isolated (07/21/2020)   Social Connection and Isolation Panel    Frequency of Communication with Friends and Family: Twice a week    Frequency of Social  Gatherings with Friends and Family: Once a week    Attends Religious Services: Never    Database administrator or Organizations: No    Attends Banker Meetings: Never    Marital Status: Never married  Intimate Partner Violence: Not At Risk (06/11/2019)   Humiliation, Afraid, Rape, and Kick questionnaire    Fear of Current or Ex-Partner: No    Emotionally Abused: No    Physically Abused: No    Sexually Abused: No    Vital Signs: Blood pressure (!) 151/94, pulse 68, resp. rate 16, height 5' 5 (1.651 m), weight 251 lb (113.9 kg), SpO2 97%. Body mass index is 41.77 kg/m.   Examination: General Appearance: The patient is well-developed, well-nourished, and in no distress. Neck Circumference: 40 cm Skin: Gross inspection of skin unremarkable. Head:  normocephalic, no gross deformities. Eyes: no gross deformities noted. ENT: ears appear grossly normal Neurologic: Alert and oriented. No involuntary movements.    STOP BANG RISK ASSESSMENT S (snore) Have you been told that you snore?     YES   T (tired) Are you often tired, fatigued, or sleepy during the day?   YES  O (obstruction) Do you stop breathing, choke, or gasp during sleep? YES   P (pressure) Do you have or are you being treated for high blood pressure? YES   B (BMI) Is your body index greater than 35 kg/m? YES   A (age) Are you 76 years old or older? YES   N (neck) Do you have a neck circumference greater than 16 inches?   NO   G (gender) Are you a female? NO   TOTAL STOP/BANG "YES" ANSWERS 6                                                               A STOP-Bang score of 2 or less is considered low risk, and a score of 5 or more is high risk for having either moderate or severe OSA. For people who score 3 or 4, doctors may need to perform further assessment to determine how likely they are to have OSA.         EPWORTH SLEEPINESS SCALE:  Scale:  (0)= no chance of dozing; (1)= slight chance of  dozing; (2)= moderate chance of dozing; (3)= high chance of dozing  Chance  Situtation    Sitting and reading: 3    Watching TV: 2    Sitting Inactive in public: 2    As a passenger in car: 3      Lying down to rest: 3    Sitting and talking: 1    Sitting quielty after lunch: 2    In a car, stopped in traffic: 3   TOTAL SCORE:   19 out of 24    SLEEP STUDIES:  None   LABS: No results found for this or any previous visit (from the past 2160 hours).  Radiology: MM 3D DIAGNOSTIC MAMMOGRAM BILATERAL BREAST Result Date: 07/31/2023 CLINICAL DATA:  Here for annual mammogram. History of right lumpectomy in 2022. EXAM: DIGITAL DIAGNOSTIC BILATERAL MAMMOGRAM WITH TOMOSYNTHESIS AND CAD TECHNIQUE: Bilateral digital diagnostic mammography and breast tomosynthesis was performed. The images were evaluated with computer-aided detection. COMPARISON:  Previous exam(s). ACR Breast Density Category a: The breasts are almost entirely fatty. FINDINGS: Post lumpectomy changes are seen in the right breast. No suspicious mass, microcalcification, or other finding is identified in either breast. IMPRESSION: No evidence of malignancy. RECOMMENDATION: Recommend routine annual screening mammogram in 1 year. I have discussed the findings and recommendations with the patient. If applicable, a reminder letter will be sent to the patient regarding the next appointment. BI-RADS CATEGORY  2: Benign. Electronically Signed   By: Norman Hopper M.D.   On: 07/31/2023 13:16    No results found.  No results found.    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 Gasping for breath (Primary) Patient evaluation suggests high risk of sleep disordered breathing due to snoring, gasping/choking, morbid obesity, daytime sleepiness, brain fog.  Patient has comorbid cardiovascular risk factors including: hypertension and syncope which could be exacerbated by pathologic sleep-disordered breathing.  Suggest: PSG to assess/treat the patient's sleep disordered breathing. The patient was also counselled on weight loss to optimize sleep health.   2. Morbid obesity (HCC) Obesity Counseling: Had a lengthy discussion regarding patients BMI and weight issues. Patient was instructed on portion control as well as increased activity. Also discussed caloric restrictions with trying to maintain intake less than 2000 Kcal. Discussions were made in accordance with the 5As of weight management. Simple actions such as not eating late and if able to, taking a walk is suggested.      General Counseling: I have discussed the findings of the evaluation and examination with Erminio.  I have also discussed any further diagnostic evaluation thatmay be needed or ordered today. Alicia verbalizes understanding of the findings of todays visit. We also reviewed her medications today and discussed drug interactions and side effects including but not limited excessive drowsiness and altered mental states. We also discussed that there is always a risk not just to her but also people around her. she has been encouraged to call the office with any questions or concerns that should arise related to todays visit.  No orders of the defined types were placed in this encounter.       I have  personally obtained a history, evaluated the patient, evaluated pertinent data, formulated the assessment and plan and placed orders.   This patient was seen today by Lauraine Lay, PA-C in collaboration with Dr. Elfreda Bathe.   Elfreda DELENA Bathe, MD [facility] Diplomate ABMS Pulmonary and Critical Care Medicine Sleep medicine

## 2024-05-18 ENCOUNTER — Ambulatory Visit (INDEPENDENT_AMBULATORY_CARE_PROVIDER_SITE_OTHER): Admitting: Internal Medicine

## 2024-05-18 VITALS — BP 151/94 | HR 68 | Resp 16 | Ht 65.0 in | Wt 251.0 lb

## 2024-05-18 DIAGNOSIS — R0689 Other abnormalities of breathing: Secondary | ICD-10-CM

## 2024-05-18 NOTE — Patient Instructions (Signed)

## 2024-05-19 ENCOUNTER — Other Ambulatory Visit: Payer: Self-pay | Admitting: Nurse Practitioner

## 2024-05-19 DIAGNOSIS — Z1231 Encounter for screening mammogram for malignant neoplasm of breast: Secondary | ICD-10-CM

## 2024-07-31 ENCOUNTER — Encounter

## 2024-09-04 ENCOUNTER — Encounter

## 2024-12-11 ENCOUNTER — Encounter
